# Patient Record
Sex: Female | Born: 1935
Health system: Southern US, Community
[De-identification: ages and names within clinical notes are randomized; demographics above are authoritative.]

## PROBLEM LIST (undated history)

## (undated) DIAGNOSIS — I10 Essential (primary) hypertension: Secondary | ICD-10-CM

## (undated) DIAGNOSIS — E119 Type 2 diabetes mellitus without complications: Secondary | ICD-10-CM

## (undated) DIAGNOSIS — G4730 Sleep apnea, unspecified: Secondary | ICD-10-CM

## (undated) DIAGNOSIS — Q453 Other congenital malformations of pancreas and pancreatic duct: Secondary | ICD-10-CM

## (undated) DIAGNOSIS — I447 Left bundle-branch block, unspecified: Secondary | ICD-10-CM

## (undated) DIAGNOSIS — I5042 Chronic combined systolic (congestive) and diastolic (congestive) heart failure: Secondary | ICD-10-CM

## (undated) DIAGNOSIS — D649 Anemia, unspecified: Secondary | ICD-10-CM

## (undated) DIAGNOSIS — I4891 Unspecified atrial fibrillation: Secondary | ICD-10-CM

## (undated) DIAGNOSIS — J449 Chronic obstructive pulmonary disease, unspecified: Secondary | ICD-10-CM

## (undated) DIAGNOSIS — E785 Hyperlipidemia, unspecified: Secondary | ICD-10-CM

## (undated) DIAGNOSIS — R945 Abnormal results of liver function studies: Secondary | ICD-10-CM

## (undated) DIAGNOSIS — K635 Polyp of colon: Secondary | ICD-10-CM

## (undated) DIAGNOSIS — R7989 Other specified abnormal findings of blood chemistry: Secondary | ICD-10-CM

## (undated) DIAGNOSIS — K648 Other hemorrhoids: Secondary | ICD-10-CM

## (undated) DIAGNOSIS — Z720 Tobacco use: Secondary | ICD-10-CM

## (undated) DIAGNOSIS — I700 Atherosclerosis of aorta: Secondary | ICD-10-CM

## (undated) DIAGNOSIS — I429 Cardiomyopathy, unspecified: Secondary | ICD-10-CM

## (undated) HISTORY — DX: Other congenital malformations of pancreas and pancreatic duct: Q45.3

## (undated) HISTORY — DX: Type 2 diabetes mellitus without complications: E11.9

## (undated) HISTORY — DX: Cardiomyopathy, unspecified: I42.9

## (undated) HISTORY — DX: Abnormal results of liver function studies: R94.5

## (undated) HISTORY — DX: Polyp of colon: K63.5

## (undated) HISTORY — DX: Tobacco use: Z72.0

## (undated) HISTORY — DX: Sleep apnea, unspecified: G47.30

## (undated) HISTORY — PX: BREAST LUMPECTOMY: SHX2

## (undated) HISTORY — DX: Hyperlipidemia, unspecified: E78.5

## (undated) HISTORY — DX: Atherosclerosis of aorta: I70.0

## (undated) HISTORY — DX: Essential (primary) hypertension: I10

## (undated) HISTORY — DX: Chronic combined systolic (congestive) and diastolic (congestive) heart failure: I50.42

## (undated) HISTORY — DX: Other hemorrhoids: K64.8

## (undated) HISTORY — DX: Chronic obstructive pulmonary disease, unspecified: J44.9

## (undated) HISTORY — DX: Other specified abnormal findings of blood chemistry: R79.89

---
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
## 1963-05-05 HISTORY — PX: ABDOMINAL HYSTERECTOMY: SHX81

## 1997-12-19 LAB — HM MAMMOGRAPHY: HM Mammogram: NORMAL

## 2009-03-19 LAB — HM COLONOSCOPY

## 2009-12-27 ENCOUNTER — Ambulatory Visit: Payer: Self-pay | Admitting: Internal Medicine

## 2009-12-27 DIAGNOSIS — R490 Dysphonia: Secondary | ICD-10-CM

## 2009-12-27 DIAGNOSIS — R0989 Other specified symptoms and signs involving the circulatory and respiratory systems: Secondary | ICD-10-CM

## 2009-12-27 DIAGNOSIS — M8580 Other specified disorders of bone density and structure, unspecified site: Secondary | ICD-10-CM

## 2009-12-27 DIAGNOSIS — I10 Essential (primary) hypertension: Secondary | ICD-10-CM | POA: Insufficient documentation

## 2009-12-27 LAB — CONVERTED CEMR LAB

## 2010-01-03 ENCOUNTER — Encounter: Payer: Self-pay | Admitting: Internal Medicine

## 2010-01-03 ENCOUNTER — Ambulatory Visit (HOSPITAL_BASED_OUTPATIENT_CLINIC_OR_DEPARTMENT_OTHER): Admission: RE | Admit: 2010-01-03 | Discharge: 2010-01-03 | Payer: Self-pay | Admitting: Internal Medicine

## 2010-01-03 ENCOUNTER — Telehealth: Payer: Self-pay | Admitting: Internal Medicine

## 2010-01-03 ENCOUNTER — Ambulatory Visit: Payer: Self-pay | Admitting: Diagnostic Radiology

## 2010-01-03 LAB — CONVERTED CEMR LAB
AST: 15 units/L (ref 0–37)
Albumin: 4.2 g/dL (ref 3.5–5.2)
Alkaline Phosphatase: 55 units/L (ref 39–117)
BUN: 17 mg/dL (ref 6–23)
Calcium: 9.5 mg/dL (ref 8.4–10.5)
Chloride: 100 meq/L (ref 96–112)
Creatinine, Ser: 0.85 mg/dL (ref 0.40–1.20)
HDL: 60 mg/dL (ref >39)
Indirect Bilirubin: 0.3 mg/dL (ref 0.0–0.9)
LDL Cholesterol: 113 mg/dL — ABNORMAL HIGH (ref 0–99)
Total Bilirubin: 0.4 mg/dL (ref 0.3–1.2)
Total Protein: 6.6 g/dL (ref 6.0–8.3)
Triglycerides: 81 mg/dL (ref <150)

## 2010-01-07 ENCOUNTER — Encounter: Payer: Self-pay | Admitting: Internal Medicine

## 2010-01-09 ENCOUNTER — Encounter: Payer: Self-pay | Admitting: Internal Medicine

## 2010-01-10 ENCOUNTER — Ambulatory Visit: Payer: Self-pay | Admitting: Internal Medicine

## 2010-01-10 ENCOUNTER — Encounter: Payer: Self-pay | Admitting: Internal Medicine

## 2010-02-14 ENCOUNTER — Ambulatory Visit: Payer: Self-pay | Admitting: Internal Medicine

## 2010-06-03 NOTE — Consult Note (Signed)
Summary: [facility]  [facility]   Imported By: Lanelle Bal 01/28/2010 10:11:46  _____________________________________________________________________  External Attachment:    Type:   Image     Comment:   External Document

## 2010-06-03 NOTE — Miscellaneous (Signed)
Summary: BONE DENSITY  Clinical Lists Changes  Orders: Added new Test order of T-Bone Densitometry (77080) - Signed Added new Test order of T-Lumbar Vertebral Assessment (77082) - Signed 

## 2010-06-03 NOTE — Assessment & Plan Note (Signed)
Summary: NEW PT EST CARE/DT   Vital Signs:  Patient profile:   77 year old female Height:      62.5 inches Weight:      123 pounds BMI:     22.22 O2 Sat:      98 % on Room air Temp:     98.6 degrees F oral Pulse rate:   92 / minute Pulse rhythm:   irregular Resp:     24 per minute BP sitting:   128 / 60  (right arm) Cuff size:   regular  Vitals Entered By: Glendell Docker CMA (December 27, 2009 1:37 PM)  O2 Flow:  Room air CC: New Patient Is Patient Diabetic? No Pain Assessment Patient in pain? no       Does patient need assistance? Functional Status Self care Ambulation Impaired:Risk for fall     Last PAP Result Hysterectomy   Primary Care Provider:  Dondra Spry DO  CC:  New Patient.  History of Present Illness: 77 y/o white female to establish 47 -  dx ' ed with Htn  chronic cough.  not worse with ACE  simvastatin -   yearly blood work  smoker since 1974 - close to 2 ppd  years ago - tried to quit but got sore throat  full aspirin causes GI upset no chronic heart burn    chronic hoarseness - never seen by ENT    Preventive Screening-Counseling & Management  Alcohol-Tobacco     Alcohol drinks/day: 0     Smoking Status: current     Packs/Day: 2.0     Year Started: 1974  Caffeine-Diet-Exercise     Caffeine use/day: 2 cups coffee daily     Does Patient Exercise: no  Allergies (verified): No Known Drug Allergies  Past History:  Past Medical History: Hypertension Hyperthyroidism  Past Surgical History: Hemorrhoidectomy 1957 Hysterectomy 1965 Lumpectomy  1966, 1971 Colonoscopy 2009 (small benign polyp)  Family History: Family History Diabetes 1st degree relative  Social History: Retired Divorced  1 daughter 51 1 son 40  ([place]) Moved from [place] 2009 - [place], [place] Smoking Status:  current Packs/Day:  2.0 Caffeine use/day:  2 cups coffee daily Does Patient Exercise:  no  Review of Systems  The patient denies anorexia,  weight loss, weight gain, chest pain, abdominal pain, melena, hematochezia, severe indigestion/heartburn, and depression.         chronic hoarseness,  denies GERD symptoms  Physical Exam  General:  alert and underweight appearing.  raspy , hoarse voice Head:  normocephalic and atraumatic.   Eyes:  pupils equal, pupils round, and pupils reactive to light.   Ears:  R ear normal, L ear normal, and no external deformities.   Mouth:  no gingival abnormalities, no dental plaque, and pharynx pink and moist.   Abdomen:  abd bruit, soft, non-tender, no hepatomegaly, and no splenomegaly.   Extremities:  No lower extremity edema  Neurologic:  cranial nerves II-XII intact and gait normal.   Psych:  normally interactive, good eye contact, not anxious appearing, and not depressed appearing.     Impression & Recommendations:  Problem # 1:  HOARSENESS, CHRONIC (ZOX-096.04) 77 y/o female smoker w chronic hoarseness.  refer to ENT - rule out throat cancer Orders: ENT Referral (ENT)  Problem # 2:  HYPERTENSION (ICD-401.9) stable. monitor labs.  Maintain current medication regimen.  Her updated medication list for this problem includes:    Lisinopril-hydrochlorothiazide 20-12.5 Mg Tabs (Lisinopril-hydrochlorothiazide) .Marland Kitchen... Take 1 tablet by  mouth every morning  Orders: T-Basic Metabolic Panel [phone])  BP today: 128/60  Problem # 3:  ABDOMINAL BRUIT (ICD-785.9)  Orders: Ultrasound (Ultrasound)  Problem # 4:  HYPERLIPIDEMIA (ICD-272.4)  Her updated medication list for this problem includes:    Simvastatin 40 Mg Tabs (Simvastatin) .Marland Kitchen... Take 1 tab by mouth at bedtime  Orders: T-Hepatic Function [phone]) T-Lipid Profile [phone]) T-TSH [phone])  Complete Medication List: 1)  Lisinopril-hydrochlorothiazide 20-12.5 Mg Tabs (Lisinopril-hydrochlorothiazide) .... Take 1 tablet by mouth every morning 2)  Simvastatin 40 Mg Tabs (Simvastatin) .... Take 1 tab by mouth at  bedtime 3)  Aspirin Low Dose 81 Mg Tabs (Aspirin) .... Take 1 tablet by mouth once a day  Other Orders: Radiology Referral (Radiology) T- * Misc. Laboratory test [phone]) Pneumococcal Vaccine (38756) Influenza Vaccine MCR [id number]) Admin 1st Vaccine (51884)  Patient Instructions: 1)  Please schedule a follow-up appointment in 2 months. Prescriptions: SIMVASTATIN 40 MG TABS (SIMVASTATIN) Take 1 tab by mouth at bedtime  #90 x 1   Entered and Authorized by:   D. Thomos Lemons DO   Signed by:   D. Thomos Lemons DO on 12/27/2009   Method used:   Electronically to        [facility] # [id number]* (retail)       [address] [place], [place]  [address]       Ph: [phone]       Fax: [phone]   RxID:   [id number] LISINOPRIL-HYDROCHLOROTHIAZIDE 20-12.5 MG TABS (LISINOPRIL-HYDROCHLOROTHIAZIDE) Take 1 tablet by mouth every morning  #90 x 1   Entered and Authorized by:   D. Thomos Lemons DO   Signed by:   D. Thomos Lemons DO on 12/27/2009   Method used:   Electronically to        [facility] # [id number]* (retail)       [address] [place], [place]  [address]       Ph: [phone]       Fax: [phone]   RxID:   [id number]   Current Allergies (reviewed today): No known allergies    Preventive Care Screening  Pap Smear:    Date:  12/27/2009    Results:  Hysterectomy  Mammogram:    Date:  12/19/1997    Results:  normal     Immunizations Administered:  Pneumonia Vaccine:    Vaccine Type: Pneumovax (Medicare)    Site: right deltoid    Mfr: Merck    Dose: 0.5 ml    Route: IM    Given by: Glendell Docker CMA    Exp. Date: 12/16/2010    Lot #: [id number]    VIS given: 02/07/2008  Influenza Vaccine # 1:    Vaccine Type: Fluvax MCR    Site: left deltoid    Mfr: GlaxoSmithKline    Dose: 0.5 ml    Route: IM    Given by: Glendell Docker CMA    Exp. Date: 11/01/2010    Lot #: [id number]    VIS given:  11/26/2009  Flu Vaccine Consent Questions:    Do you have a history of severe allergic reactions to this vaccine? no    Any prior history of allergic reactions to egg and/or gelatin? no    Do you have a sensitivity to the preservative Thimersol? no  Do you have a past history of Guillan-Barre Syndrome? no    Do you currently have an acute febrile illness? no    Have you ever had a severe reaction to latex? no    Vaccine information given and explained to patient? yes    Are you currently pregnant? no

## 2010-06-03 NOTE — Letter (Signed)
   [facility] [address] [place], [place]  [address]  [place] Phone: [phone]      January 07, 2010   Caitlain Ingle [address] [facility] [address] [place], [place] [address]  RE:  LAB RESULTS  Dear  Ms. NEIDHARDT,  The following is an interpretation of your most recent lab tests.  Please take note of any instructions provided or changes to medications that have resulted from your lab work.  ELECTROLYTES:  Good - no changes needed  KIDNEY FUNCTION TESTS:  Good - no changes needed  LIVER FUNCTION TESTS:  Good - no changes needed  LIPID PANEL:  Fair - review at your next visit Triglyceride: 81   Cholesterol: 189   LDL: 113   HDL: 60   Chol/HDL%:  3.2 Ratio  THYROID STUDIES:  Thyroid studies normal TSH: 0.832     Vitamin D level - low       Sincerely Yours,    Dr. Thomos Lemons  Appended Document:  mailed

## 2010-06-03 NOTE — Letter (Signed)
Summary: [facility]ensit[facility] / [facility] / [place]   Imported By: Lennie Odor 01/16/2010 12:03:14  _____________________________________________________________________  External Attachment:    Type:   Image     Comment:   External Document

## 2010-06-03 NOTE — Progress Notes (Signed)
Summary: Ultrasound Results  Phone Note Outgoing Call   Summary of Call: call pt - no abdominal aortic aneurysm.  I will further discuss details of abd u/s at next OV in 2 months. (plz make sure pt have f/u appt within 2 months) Initial call taken by: D. Thomos Lemons DO,  January 03, 2010 6:06 PM  Follow-up for Phone Call        call placed to patient at [phone], no answer. A voice message was left for patient to return call Follow-up by: Glendell Docker CMA,  January 07, 2010 11:22 AM  Additional Follow-up for Phone Call Additional follow up Details #1::        call pt - vitamin D level is low.  I suggest pt take vit D3 otc 2000 units once daily I will send letter re:  other lab results    Additional Follow-up for Phone Call Additional follow up Details #2::    Pt notified of lab results. She states that her daughter or son-in-law will have to call us to schedule appt. as she relies on them for transportation. Nicki Guadalajara Fergerson CMA Duncan Dull)  January 09, 2010 8:41 AM

## 2010-06-03 NOTE — Assessment & Plan Note (Signed)
Summary: 2 mon  f/u/hea   Vital Signs:  Patient profile:   77 year old female Height:      62.5 inches Weight:      124.50 pounds BMI:     22.49 O2 Sat:      97 % on Room air Temp:     98.4 degrees F oral Pulse rate:   93 / minute Pulse rhythm:   regular Resp:     18 per minute BP sitting:   110 / 70  (left arm) Cuff size:   regular  Vitals Entered By: Glendell Docker CMA (February 14, 2010 2:23 PM)  O2 Flow:  Room air CC: 2 Month Follow up  Is Patient Diabetic? No Pain Assessment Patient in pain? no        Primary Care Provider:  Dondra Spry DO  CC:  2 Month Follow up .  History of Present Illness: 77 y/o white female for f/u evaluated by ENT for chronic hoarseness pt noted to have vocal cord edema but no evidence of malignancy  reviewed bone density scan she has osteopenia  Preventive Screening-Counseling & Management  Alcohol-Tobacco     Smoking Status: current     Smoking Cessation Counseling: yes  Allergies (verified): No Known Drug Allergies  Past History:  Past Medical History: Hypertension Hyperthyroidism    Past Surgical History: Hemorrhoidectomy 1957 Hysterectomy 1965 Lumpectomy  1966, 1971  Colonoscopy 2009 (small benign polyp)  Family History: Family History Diabetes 1st degree relative    Social History: Retired Divorced  1 daughter 57  1 son 40  ([place]) Moved from [place] 2009 - [place], [place]  Physical Exam  General:  alert, well-developed, and well-nourished.   Lungs:  normal respiratory effort.  prolonged expiration Heart:  normal rate, regular rhythm, and no gallop.     Impression & Recommendations:  Problem # 1:  HOARSENESS, CHRONIC (ICD-784.42) Assessment Unchanged evaluated by ENT vocal cord edema no evidence of mass  Problem # 2:  OSTEOPENIA (ICD-733.90) T score of hips -1.9 continue calcium via diet and vit d supplement.   repeat dexa in 2 yrs  Problem # 3:  HYPERTENSION (ICD-401.9) Assessment:  Unchanged  Her updated medication list for this problem includes:    Lisinopril-hydrochlorothiazide 20-12.5 Mg Tabs (Lisinopril-hydrochlorothiazide) .Marland Kitchen... Take 1 tablet by mouth every morning  BP today: 110/70 Prior BP: 128/60 (12/27/2009)  Labs Reviewed: K+: 4.2 (01/03/2010) Creat: : 0.85 (01/03/2010)   Chol: 189 (01/03/2010)   HDL: 60 (01/03/2010)   LDL: 113 (01/03/2010)   TG: 81 (01/03/2010)  Problem # 4:  ABDOMINAL BRUIT (ICD-785.9) IMPRESSION:   1.  Extensive mural atheromatous change with calcific plaque with normal caliber abdominal aorta and bilateral common iliac arteries. 2.  Otherwise, negative.    Problem # 5:  HYPERLIPIDEMIA (ICD-272.4)  Her updated medication list for this problem includes:    Simvastatin 40 Mg Tabs (Simvastatin) .Marland Kitchen... Take 1 tab by mouth at bedtime  Labs Reviewed: SGOT: 15 (01/03/2010)   SGPT: 11 (01/03/2010)   HDL:60 (01/03/2010)  LDL:113 (01/03/2010)  Chol:189 (01/03/2010)  Trig:81 (01/03/2010)  Complete Medication List: 1)  Lisinopril-hydrochlorothiazide 20-12.5 Mg Tabs (Lisinopril-hydrochlorothiazide) .... Take 1 tablet by mouth every morning 2)  Simvastatin 40 Mg Tabs (Simvastatin) .... Take 1 tab by mouth at bedtime 3)  Aspirin Low Dose 81 Mg Tabs (Aspirin) .... Take 1 tablet by mouth once a day 4)  Vitamin D 2000 Unit Tabs (Cholecalciferol) .... Take 1 tablet by mouth once a day  Patient  Instructions: 1)  Please schedule a follow-up appointment in 6 months. 2)  BMP prior to visit, ICD-9:  401.9 3)  vitamin d level: 733.90 4)  Please return for lab work one (1) week before your next appointment.   Current Allergies (reviewed today): No known allergies

## 2010-06-05 ENCOUNTER — Encounter: Payer: Self-pay | Admitting: Internal Medicine

## 2010-08-15 ENCOUNTER — Ambulatory Visit: Payer: Self-pay | Admitting: Internal Medicine

## 2010-08-22 ENCOUNTER — Ambulatory Visit (INDEPENDENT_AMBULATORY_CARE_PROVIDER_SITE_OTHER): Payer: Medicare Other | Admitting: Internal Medicine

## 2010-08-22 ENCOUNTER — Encounter: Payer: Self-pay | Admitting: Internal Medicine

## 2010-08-22 DIAGNOSIS — Z720 Tobacco use: Secondary | ICD-10-CM

## 2010-08-22 DIAGNOSIS — E785 Hyperlipidemia, unspecified: Secondary | ICD-10-CM

## 2010-08-22 DIAGNOSIS — I10 Essential (primary) hypertension: Secondary | ICD-10-CM

## 2010-08-22 DIAGNOSIS — M899 Disorder of bone, unspecified: Secondary | ICD-10-CM

## 2010-08-22 DIAGNOSIS — F17200 Nicotine dependence, unspecified, uncomplicated: Secondary | ICD-10-CM

## 2010-08-22 DIAGNOSIS — M949 Disorder of cartilage, unspecified: Secondary | ICD-10-CM

## 2010-08-22 LAB — HM PAP SMEAR

## 2010-08-22 MED ORDER — LISINOPRIL-HYDROCHLOROTHIAZIDE 20-12.5 MG PO TABS: 1.0000 | ORAL_TABLET | ORAL | Status: DC

## 2010-08-22 MED ORDER — SIMVASTATIN 40 MG PO TABS: 20.0000 mg | ORAL_TABLET | Freq: Every day | ORAL | Status: DC

## 2010-08-22 NOTE — Assessment & Plan Note (Signed)
Reduce dose to 20 mg LFTs and FLP before next OV

## 2010-08-22 NOTE — Assessment & Plan Note (Signed)
Pt has been taking otc vit D 2000 units once daily Repeat vit D level today

## 2010-08-22 NOTE — Progress Notes (Signed)
  Subjective:    Patient ID: Sarah [name], female    DOB: February 20, 1932, 77 y.o.   MRN: [id number]  Hypertension This is a chronic problem. The current episode started more than 1 year ago. The problem is unchanged. The problem is controlled. Risk factors for coronary artery disease include smoking/tobacco exposure and sedentary lifestyle. Past treatments include ACE inhibitors and diuretics. There are no compliance problems.  There is no history of kidney disease or heart failure.   She has AM cough but continues to smoke   Review of Systems    Negative for chest pain  Past Medical History  Diagnosis Date  . Hypertension   . Thyroid disease     hyperthyroidism    History   Social History  . Marital Status: Divorced    Spouse Name: N/A    Number of Children: 2  . Years of Education: N/A   Occupational History  . retired    Social History Main Topics  . Smoking status: Current Everyday Smoker  . Smokeless tobacco: Not on file  . Alcohol Use: No  . Drug Use: Not on file  . Sexually Active: Not on file   Other Topics Concern  . Not on file   Social History Narrative   Moved from [place], [place] 2009    Past Surgical History  Procedure Date  . Abdominal hysterectomy 1965  . Hemorroidectomy 1957  . Breast lumpectomy 1966, 1971    Family History  Problem Relation Age of Onset  . Diabetes      No Known Allergies  Current Outpatient Prescriptions on File Prior to Visit  Medication Sig Dispense Refill  . aspirin 81 MG tablet Take 81 mg by mouth daily.        . Cholecalciferol (VITAMIN D3) 2000 UNITS TABS Take 1 tablet by mouth daily.          BP 100/60  Pulse 73  Temp(Src) 98.6 F (37 C) (Oral)  Resp 20  Ht 5' 2.6" (1.59 m)  Wt 122 lb (55.339 kg)  BMI 21.89 kg/m2  SpO2 97%    Objective:   Physical Exam  Constitutional: She appears well-developed and well-nourished.  Cardiovascular: Normal rate, regular rhythm and normal heart sounds.     Pulmonary/Chest: Effort normal. She has no wheezes. She has no rales.       Prolonged expiration  Skin: Skin is warm and dry.  Psychiatric: Her behavior is normal.          Assessment & Plan:

## 2010-08-22 NOTE — Assessment & Plan Note (Signed)
BP is well controlled.  She denies dizziness. Monitor electrolytes   Chemistry      Component Value Date/Time   NA 136 01/03/2010 1832   K 4.2 01/03/2010 1832   CL 100 01/03/2010 1832   CO2 25 01/03/2010 1832   BUN 17 01/03/2010 1832   CREATININE 0.85 01/03/2010 1832      Component Value Date/Time   CALCIUM 9.5 01/03/2010 1832   ALKPHOS 55 01/03/2010 1832   AST 15 01/03/2010 1832   ALT 11 01/03/2010 1832   BILITOT 0.4 01/03/2010 1832

## 2010-08-22 NOTE — Patient Instructions (Signed)
Please complete the following lab tests before your next follow up appointment: BMET - 401.9 FLP, LFTs - 272.4 

## 2010-08-23 LAB — BASIC METABOLIC PANEL WITH GFR
CO2: 22 mEq/L (ref 19–32)
Chloride: 102 mEq/L (ref 96–112)
Glucose, Bld: 100 mg/dL — ABNORMAL HIGH (ref 70–99)
Potassium: 4.6 mEq/L (ref 3.5–5.3)
Sodium: 138 mEq/L (ref 135–145)

## 2010-08-25 ENCOUNTER — Encounter: Payer: Self-pay | Admitting: Internal Medicine

## 2011-01-16 ENCOUNTER — Encounter: Payer: Self-pay | Admitting: Internal Medicine

## 2011-01-16 ENCOUNTER — Ambulatory Visit (INDEPENDENT_AMBULATORY_CARE_PROVIDER_SITE_OTHER): Payer: Medicare Other | Admitting: Internal Medicine

## 2011-01-16 VITALS — BP 124/60 | HR 93 | Temp 98.6°F | Resp 18 | Ht 62.0 in | Wt 116.0 lb

## 2011-01-16 DIAGNOSIS — R3129 Other microscopic hematuria: Secondary | ICD-10-CM

## 2011-01-16 DIAGNOSIS — Z23 Encounter for immunization: Secondary | ICD-10-CM

## 2011-01-16 DIAGNOSIS — N951 Menopausal and female climacteric states: Secondary | ICD-10-CM

## 2011-01-16 DIAGNOSIS — R232 Flushing: Secondary | ICD-10-CM

## 2011-01-16 DIAGNOSIS — M549 Dorsalgia, unspecified: Secondary | ICD-10-CM

## 2011-01-16 LAB — POCT URINALYSIS DIPSTICK
Bilirubin, UA: NEGATIVE
Glucose, UA: NEGATIVE
Ketones, UA: NEGATIVE
Spec Grav, UA: 1.015

## 2011-01-16 NOTE — Assessment & Plan Note (Signed)
Hx suggestive of msk etiology. ua shows microscopic hematuria. Discussed plain radiograph and medication both of which she defers. Pt wishes for period of further observation. Instructed to followup closely if no improvement or worsening.

## 2011-01-16 NOTE — Assessment & Plan Note (Signed)
Asx. Presentation not suggestive of kidney stones or uti. Obtain ua with micro.

## 2011-01-16 NOTE — Progress Notes (Signed)
  Subjective:    Patient ID: Sarah [name], female    DOB: 14-Feb-1932, 77 y.o.   MRN: [id number]  HPI Pt presents to clinic for evaluation of back pain. Notes several week h/o right low back pain without injury/trauma/fall. Pain does not radiate and localizes generally to SI area. States began after bed mattress was flipped over. Just one week ago flipped mattress back. Pain is positional and intermittent. Tylenol was constipating. Attempted motrin 400mg  tid x 1wk without abdominal pain or burning. Denies fever, chills, urinary frequency/urgency, dysuria or hematuria. Also notes few wk h/o intermittent sweats and hot flashes. Denies fevers. No other constitutional sx's. No exacerbating or alleviating factors. No other complaints.  Past Medical History  Diagnosis Date  . Hypertension   . Thyroid disease     hyperthyroidism   Past Surgical History  Procedure Date  . Abdominal hysterectomy 1965  . Hemorroidectomy 1957  . Breast lumpectomy 1966, 1971    reports that she has been smoking.  She has never used smokeless tobacco. She reports that she does not drink alcohol. Her drug history not on file. family history includes Diabetes in an unspecified family member. No Known Allergies     Review of Systems see hpi     Objective:   Physical Exam  Nursing note and vitals reviewed. Constitutional: She appears well-developed and well-nourished. No distress.  HENT:  Head: Normocephalic and atraumatic.  Eyes: Conjunctivae are normal. No scleral icterus.  Musculoskeletal:       No midline ls tenderness. Gait nl. Area of pain localized to right SI area vs lower paraspinal muscle tendon insertion.   Neurological: She is alert.  Skin: Skin is warm. She is not diaphoretic.  Psychiatric: She has a normal mood and affect.          Assessment & Plan:

## 2011-01-16 NOTE — Assessment & Plan Note (Signed)
Discussed potential etiologies. Pt does not feel represents fever. Declines evaluation. Agrees to close followup if sx's persist or worsen.

## 2011-01-17 LAB — URINALYSIS, ROUTINE W REFLEX MICROSCOPIC
Glucose, UA: NEGATIVE mg/dL
Leukocytes, UA: NEGATIVE
Nitrite: NEGATIVE
pH: 6 (ref 5.0–8.0)

## 2011-01-17 LAB — URINALYSIS, MICROSCOPIC ONLY
Casts: NONE SEEN
Squamous Epithelial / LPF: NONE SEEN

## 2011-04-01 ENCOUNTER — Telehealth: Payer: Self-pay | Admitting: Internal Medicine

## 2011-04-01 NOTE — Telephone Encounter (Signed)
[facility] n [address] [place] refill lisinopril and simvastatin

## 2011-04-02 MED ORDER — LISINOPRIL-HYDROCHLOROTHIAZIDE 20-12.5 MG PO TABS: 1.0000 | ORAL_TABLET | ORAL | Status: DC

## 2011-04-02 MED ORDER — SIMVASTATIN 40 MG PO TABS: 20.0000 mg | ORAL_TABLET | Freq: Every day | ORAL | Status: DC

## 2011-04-02 NOTE — Telephone Encounter (Signed)
Rx refill sent to pharmacy. 

## 2011-04-17 ENCOUNTER — Ambulatory Visit (HOSPITAL_BASED_OUTPATIENT_CLINIC_OR_DEPARTMENT_OTHER)
Admission: RE | Admit: 2011-04-17 | Discharge: 2011-04-17 | Disposition: A | Discharge status: Home / Self Care | Payer: Medicare Other | Source: Ambulatory Visit | Attending: Internal Medicine | Admitting: Internal Medicine

## 2011-04-17 ENCOUNTER — Ambulatory Visit (INDEPENDENT_AMBULATORY_CARE_PROVIDER_SITE_OTHER): Payer: Medicare Other | Admitting: Internal Medicine

## 2011-04-17 ENCOUNTER — Encounter: Payer: Self-pay | Admitting: Internal Medicine

## 2011-04-17 DIAGNOSIS — M47817 Spondylosis without myelopathy or radiculopathy, lumbosacral region: Secondary | ICD-10-CM

## 2011-04-17 DIAGNOSIS — I10 Essential (primary) hypertension: Secondary | ICD-10-CM

## 2011-04-17 DIAGNOSIS — M4120 Other idiopathic scoliosis, site unspecified: Secondary | ICD-10-CM

## 2011-04-17 DIAGNOSIS — M549 Dorsalgia, unspecified: Secondary | ICD-10-CM | POA: Insufficient documentation

## 2011-04-17 DIAGNOSIS — E785 Hyperlipidemia, unspecified: Secondary | ICD-10-CM

## 2011-04-17 LAB — HEPATIC FUNCTION PANEL
AST: 17 U/L (ref 0–37)
Albumin: 4.2 g/dL (ref 3.5–5.2)
Alkaline Phosphatase: 43 U/L (ref 39–117)
Indirect Bilirubin: 0.3 mg/dL (ref 0.0–0.9)
Total Bilirubin: 0.4 mg/dL (ref 0.3–1.2)

## 2011-04-17 LAB — LIPID PANEL
HDL: 61 mg/dL (ref >39)
LDL Cholesterol: 110 mg/dL — ABNORMAL HIGH (ref 0–99)

## 2011-04-17 LAB — CBC
HCT: 40.1 % (ref 36.0–46.0)
MCH: 33.9 pg (ref 26.0–34.0)
MCV: 99.3 fL (ref 78.0–100.0)
Platelets: 354 10*3/uL (ref 150–400)
RBC: 4.04 MIL/uL (ref 3.87–5.11)
RDW: 13.3 % (ref 11.5–15.5)

## 2011-04-17 LAB — BASIC METABOLIC PANEL
BUN: 17 mg/dL (ref 6–23)
CO2: 26 mEq/L (ref 19–32)
Calcium: 9.9 mg/dL (ref 8.4–10.5)
Creat: 0.83 mg/dL (ref 0.50–1.10)

## 2011-04-17 NOTE — Progress Notes (Signed)
  Subjective:    Patient ID: Sarah [name], female    DOB: 1931/05/26, 77 y.o.   MRN: [id number]  HPI Pt presents to clinic for followup of multiple medical problems. Notes chronic intermittent low back pain since 9/12. No fall or trauma. Pain does not radiate down legs. Takes ibuprofen daily without gi adverse effect. Tolerates statin tx without myalgias or abn lft. BP reviewed normotensive. No other complaints.  Past Medical History  Diagnosis Date  . Hypertension   . Thyroid disease     hyperthyroidism   Past Surgical History  Procedure Date  . Abdominal hysterectomy 1965  . Hemorroidectomy 1957  . Breast lumpectomy 1966, 1971    reports that she has been smoking.  She has never used smokeless tobacco. She reports that she does not drink alcohol. Her drug history not on file. family history includes Diabetes in an unspecified family member. No Known Allergies   Review of Systems see hpi     Objective:   Physical Exam  Nursing note and vitals reviewed. Constitutional: She appears well-developed and well-nourished. No distress.  HENT:  Head: Normocephalic and atraumatic.  Eyes: Conjunctivae are normal. No scleral icterus.  Musculoskeletal:       NT ls midline spine without bony abn. No overt muscle spasm. Nl gait  Skin: She is not diaphoretic.          Assessment & Plan:

## 2011-04-18 NOTE — Assessment & Plan Note (Signed)
Obtain lipid/lft. 

## 2011-04-18 NOTE — Assessment & Plan Note (Signed)
Obtain ls plain xray. Samples of lidoderm patches. Limit ibuprofen

## 2011-04-18 NOTE — Assessment & Plan Note (Signed)
Normotensive and stable. Continue current regimen. Monitor bp as outpt and followup in clinic as scheduled. Obtain cbc, chem7 

## 2011-04-22 ENCOUNTER — Telehealth: Payer: Self-pay | Admitting: *Deleted

## 2011-04-22 NOTE — Telephone Encounter (Signed)
Call returned to [place] at [phone], no answer; no voice message left.

## 2011-04-22 NOTE — Telephone Encounter (Signed)
Patients daughter Darl Pikes returned phone call, and stated the message was received regarding the degenerative changes. She would like to know what patient should do should any problems arise.

## 2011-04-22 NOTE — Telephone Encounter (Signed)
Call placed to patients daughter at [phone],, she was informed per Dr Rodena Medin instructions. She stated patient has tried the heating pad, and did not get any relief. Daughter stated she will see how patient does and if there is no improvement she will call back on Wednesday of next week.

## 2011-04-22 NOTE — Telephone Encounter (Signed)
Having the degenerative changes means she is more likely to have flares of back pain in the future

## 2011-04-23 ENCOUNTER — Telehealth: Payer: Self-pay | Admitting: *Deleted

## 2011-04-23 MED ORDER — TRAMADOL HCL 50 MG PO TABS: ORAL_TABLET | ORAL | Status: DC

## 2011-04-23 NOTE — Telephone Encounter (Signed)
Ultram 50mg  1/2 to 1 po tid prn pain #30

## 2011-04-23 NOTE — Telephone Encounter (Signed)
Patients daughter called and left voice message stating she has spoke with patient and she is having a significant amount of back pain. She would like to know if Dr Rodena Medin would send a Rx for pain into the [facility] pharmacy.

## 2011-04-23 NOTE — Telephone Encounter (Signed)
Call placed to patients daughter Darl Pikes. She was informed of Rx to pharmacy.

## 2011-05-26 ENCOUNTER — Other Ambulatory Visit: Payer: Self-pay | Admitting: Internal Medicine

## 2011-05-26 NOTE — Telephone Encounter (Signed)
Ok #30 rf 1 

## 2011-05-26 NOTE — Telephone Encounter (Signed)
Rx refill sent to pharmacy. 

## 2011-06-09 ENCOUNTER — Telehealth: Payer: Self-pay | Admitting: Internal Medicine

## 2011-06-09 MED ORDER — SIMVASTATIN 40 MG PO TABS: 20.0000 mg | ORAL_TABLET | ORAL | Status: DC

## 2011-06-09 NOTE — Telephone Encounter (Signed)
Refill sent to pharmacy.   

## 2011-07-17 ENCOUNTER — Ambulatory Visit: Payer: Medicare Other | Admitting: Internal Medicine

## 2011-07-22 ENCOUNTER — Telehealth: Payer: Self-pay | Admitting: Internal Medicine

## 2011-07-22 MED ORDER — LISINOPRIL-HYDROCHLOROTHIAZIDE 20-12.5 MG PO TABS: 1.0000 | ORAL_TABLET | ORAL | Status: DC

## 2011-07-22 NOTE — Telephone Encounter (Signed)
Refill sent to pharmacy #90 x no refills. 

## 2011-07-22 NOTE — Telephone Encounter (Signed)
Refill- lisinopril-hctz 20/12.5mg . PO QAM #90.

## 2011-07-24 ENCOUNTER — Ambulatory Visit: Payer: Medicare Other | Admitting: Internal Medicine

## 2011-08-14 ENCOUNTER — Ambulatory Visit (INDEPENDENT_AMBULATORY_CARE_PROVIDER_SITE_OTHER): Payer: Medicare Other | Admitting: Internal Medicine

## 2011-08-14 ENCOUNTER — Encounter: Payer: Self-pay | Admitting: Internal Medicine

## 2011-08-14 VITALS — BP 124/80 | HR 103 | Temp 98.8°F | Resp 20 | Wt 114.0 lb

## 2011-08-14 DIAGNOSIS — I10 Essential (primary) hypertension: Secondary | ICD-10-CM

## 2011-08-14 DIAGNOSIS — M549 Dorsalgia, unspecified: Secondary | ICD-10-CM

## 2011-08-14 DIAGNOSIS — E785 Hyperlipidemia, unspecified: Secondary | ICD-10-CM

## 2011-08-14 NOTE — Patient Instructions (Signed)
Please schedule cbc 401.9, chem7 v58.69 and lipid/lft 272.4 prior to next visit 

## 2011-08-14 NOTE — Progress Notes (Signed)
  Subjective:    Patient ID: Sarah [name], female    DOB: 05/17/1931, 78 y.o.   MRN: [id number]  HPI Pt presents to clinic for followup of multiple medical problems. Previous back pain now resolved. Does not take ultram any longer. Has intermittent left shoulder pain she relates to cold weather and recently improved. No injury/trauma or decreased ROM. No other complaints.  Past Medical History  Diagnosis Date  . Hypertension   . Thyroid disease     hyperthyroidism   Past Surgical History  Procedure Date  . Abdominal hysterectomy 1965  . Hemorroidectomy 1957  . Breast lumpectomy 1966, 1971    reports that she has been smoking.  She has never used smokeless tobacco. She reports that she does not drink alcohol. Her drug history not on file. family history includes Diabetes in an unspecified family member. No Known Allergies    Review of Systems see hpi     Objective:   Physical Exam  Physical Exam  Nursing note and vitals reviewed. Constitutional: Appears well-developed and well-nourished. No distress.  HENT:  Head: Normocephalic and atraumatic.  Right Ear: External ear normal.  Left Ear: External ear normal.  Eyes: Conjunctivae are normal. No scleral icterus.  Neck: Neck supple. Carotid bruit is not present.  Cardiovascular: Normal rate, regular rhythm and normal heart sounds.  Exam reveals no gallop and no friction rub.   No murmur heard. Pulmonary/Chest: Effort normal and breath sounds normal. No respiratory distress. He has no wheezes. no rales.  Lymphadenopathy:    He has no cervical adenopathy.  Neurological:Alert.  Skin: Skin is warm and dry. Not diaphoretic.  Psychiatric: Has a normal mood and affect.        Assessment & Plan:

## 2011-08-14 NOTE — Assessment & Plan Note (Signed)
Stable. Obtain lipid/lft prior to next visit. 

## 2011-08-14 NOTE — Assessment & Plan Note (Signed)
Normotensive and stable. Continue current regimen. Monitor bp as outpt and followup in clinic as scheduled.  

## 2011-08-14 NOTE — Assessment & Plan Note (Signed)
resolved 

## 2011-09-21 ENCOUNTER — Other Ambulatory Visit: Payer: Self-pay | Admitting: Internal Medicine

## 2011-09-21 NOTE — Telephone Encounter (Signed)
Rx refill sent to pharmacy. 

## 2011-10-08 ENCOUNTER — Encounter: Payer: Self-pay | Admitting: Internal Medicine

## 2011-11-06 ENCOUNTER — Other Ambulatory Visit: Payer: Self-pay | Admitting: *Deleted

## 2011-11-06 DIAGNOSIS — E785 Hyperlipidemia, unspecified: Secondary | ICD-10-CM

## 2011-11-06 DIAGNOSIS — I10 Essential (primary) hypertension: Secondary | ICD-10-CM

## 2011-11-06 DIAGNOSIS — Z79899 Other long term (current) drug therapy: Secondary | ICD-10-CM

## 2011-11-06 LAB — CBC WITH DIFFERENTIAL/PLATELET
Basophils Relative: 1 % (ref 0–1)
Eosinophils Absolute: 0.2 10*3/uL (ref 0.0–0.7)
Eosinophils Relative: 2 % (ref 0–5)
HCT: 38 % (ref 36.0–46.0)
Hemoglobin: 13.5 g/dL (ref 12.0–15.0)
Lymphs Abs: 2.7 10*3/uL (ref 0.7–4.0)
MCH: 33.9 pg (ref 26.0–34.0)
MCHC: 35.5 g/dL (ref 30.0–36.0)
MCV: 95.5 fL (ref 78.0–100.0)
Monocytes Absolute: 0.9 10*3/uL (ref 0.1–1.0)
Monocytes Relative: 9 % (ref 3–12)
Neutrophils Relative %: 63 % (ref 43–77)
RBC: 3.98 MIL/uL (ref 3.87–5.11)

## 2011-11-06 LAB — LIPID PANEL
Cholesterol: 180 mg/dL (ref 0–200)
Total CHOL/HDL Ratio: 3.1 Ratio

## 2011-11-06 NOTE — Progress Notes (Signed)
Pt presented to the lab, orders printed and given to the lab.

## 2011-11-06 NOTE — Progress Notes (Signed)
Per OV note, 04.12.13/SLS

## 2011-11-07 LAB — HEPATIC FUNCTION PANEL
ALT: 10 U/L (ref 0–35)
AST: 16 U/L (ref 0–37)
Bilirubin, Direct: 0.1 mg/dL (ref 0.0–0.3)
Indirect Bilirubin: 0.3 mg/dL (ref 0.0–0.9)
Total Bilirubin: 0.4 mg/dL (ref 0.3–1.2)

## 2011-11-07 LAB — BASIC METABOLIC PANEL
BUN: 14 mg/dL (ref 6–23)
CO2: 24 mEq/L (ref 19–32)
Calcium: 9.7 mg/dL (ref 8.4–10.5)
Glucose, Bld: 85 mg/dL (ref 70–99)
Sodium: 137 mEq/L (ref 135–145)

## 2011-11-13 ENCOUNTER — Ambulatory Visit (INDEPENDENT_AMBULATORY_CARE_PROVIDER_SITE_OTHER): Payer: Medicare Other | Admitting: Internal Medicine

## 2011-11-13 ENCOUNTER — Encounter: Payer: Self-pay | Admitting: Internal Medicine

## 2011-11-13 VITALS — BP 124/76 | HR 93 | Temp 98.6°F | Resp 16 | Wt 113.0 lb

## 2011-11-13 DIAGNOSIS — I10 Essential (primary) hypertension: Secondary | ICD-10-CM

## 2011-11-13 DIAGNOSIS — R0989 Other specified symptoms and signs involving the circulatory and respiratory systems: Secondary | ICD-10-CM

## 2011-11-13 NOTE — Progress Notes (Signed)
  Subjective:    Patient ID: Sarah [name], female    DOB: 1931-12-12, 78 y.o.   MRN: [id number]  HPI Pt presents to clinic for followup of multiple medical problems. Denies back pain. Left shoulder pain only occurs with weather changes. Weight reviewed stable. Notes intermittently cold feet bilaterally. No claudication.   Past Medical History  Diagnosis Date  . Hypertension   . Thyroid disease     hyperthyroidism   Past Surgical History  Procedure Date  . Abdominal hysterectomy 1965  . Hemorroidectomy 1957  . Breast lumpectomy 1966, 1971    reports that she has been smoking.  She has never used smokeless tobacco. She reports that she does not drink alcohol. Her drug history not on file. family history includes Diabetes in an unspecified family member. No Known Allergies    Review of Systems see hpi     Objective:   Physical Exam  Physical Exam  Nursing note and vitals reviewed. Constitutional: Appears well-developed and well-nourished. No distress.  HENT:  Head: Normocephalic and atraumatic.  Right Ear: External ear normal.  Left Ear: External ear normal.  Eyes: Conjunctivae are normal. No scleral icterus.  Neck: Neck supple. Carotid bruit is not present.  Cardiovascular: Normal rate, regular rhythm and normal heart sounds.  Exam reveals no gallop and no friction rub.   No murmur heard. Pulmonary/Chest: Effort normal and breath sounds normal. No respiratory distress. He has no wheezes. no rales.  Lymphadenopathy:    He has no cervical adenopathy.  Neurological:Alert.  Skin: Skin is warm and dry. Not diaphoretic.  Psychiatric: Has a normal mood and affect.  Ext: trace to +1 bilateral DP pulses      Assessment & Plan:

## 2011-11-15 DIAGNOSIS — R0989 Other specified symptoms and signs involving the circulatory and respiratory systems: Secondary | ICD-10-CM | POA: Insufficient documentation

## 2011-11-15 NOTE — Assessment & Plan Note (Signed)
No evidence of limb threatening ischemia. No claudication. Declines tobacco cessation. Encourage exercise. Will monitor

## 2011-11-15 NOTE — Assessment & Plan Note (Signed)
Normotensive and stable. Continue current regimen. Monitor bp as outpt and followup in clinic as scheduled.  

## 2011-11-26 ENCOUNTER — Telehealth: Payer: Self-pay | Admitting: *Deleted

## 2011-11-26 NOTE — Telephone Encounter (Signed)
Just saw the pt. She told me that it only hurt sometimes with the weather. Can refill ultram on the med list #30

## 2011-11-26 NOTE — Telephone Encounter (Signed)
Received voice message from pt's daughter stating pt saw Dr Pearletha Forge for some rotator cuff issues that will not require surgery but pt was given exercises to do at home. Darl Pikes states pt will not do the exercises and wants to know if we will prescribe the pt something for her pain?  Please advise.

## 2011-11-27 NOTE — Telephone Encounter (Signed)
Left message on machine to return my call. 

## 2011-11-30 MED ORDER — TRAMADOL HCL 50 MG PO TABS: ORAL_TABLET | ORAL | Status: DC

## 2011-11-30 NOTE — Telephone Encounter (Signed)
Spoke to [place], she states pt has been attempting to do the exercises at home but does them incorrectly. Advised her per instructions below and rx sent to [facility] pharmacy.

## 2011-12-07 ENCOUNTER — Ambulatory Visit (INDEPENDENT_AMBULATORY_CARE_PROVIDER_SITE_OTHER): Payer: Medicare Other | Admitting: Internal Medicine

## 2011-12-07 ENCOUNTER — Ambulatory Visit (HOSPITAL_BASED_OUTPATIENT_CLINIC_OR_DEPARTMENT_OTHER)
Admission: RE | Admit: 2011-12-07 | Discharge: 2011-12-07 | Disposition: A | Discharge status: Home / Self Care | Payer: Medicare Other | Source: Ambulatory Visit | Attending: Internal Medicine | Admitting: Internal Medicine

## 2011-12-07 ENCOUNTER — Encounter: Payer: Self-pay | Admitting: Internal Medicine

## 2011-12-07 VITALS — BP 120/72 | HR 102 | Temp 98.5°F | Resp 16 | Ht 62.25 in | Wt 115.5 lb

## 2011-12-07 DIAGNOSIS — K625 Hemorrhage of anus and rectum: Secondary | ICD-10-CM

## 2011-12-07 DIAGNOSIS — M25512 Pain in left shoulder: Secondary | ICD-10-CM | POA: Insufficient documentation

## 2011-12-07 DIAGNOSIS — R071 Chest pain on breathing: Secondary | ICD-10-CM

## 2011-12-07 DIAGNOSIS — R0789 Other chest pain: Secondary | ICD-10-CM

## 2011-12-07 DIAGNOSIS — M479 Spondylosis, unspecified: Secondary | ICD-10-CM | POA: Insufficient documentation

## 2011-12-07 DIAGNOSIS — M25519 Pain in unspecified shoulder: Secondary | ICD-10-CM

## 2011-12-07 DIAGNOSIS — M549 Dorsalgia, unspecified: Secondary | ICD-10-CM

## 2011-12-07 DIAGNOSIS — M4120 Other idiopathic scoliosis, site unspecified: Secondary | ICD-10-CM | POA: Insufficient documentation

## 2011-12-07 MED ORDER — METHYLPREDNISOLONE ACETATE 80 MG/ML IJ SUSP
40.0000 mg | Freq: Once | INTRAMUSCULAR | Status: AC
  Administered 2011-12-07: 40 mg via INTRAMUSCULAR

## 2011-12-07 NOTE — Progress Notes (Signed)
  Subjective:    Patient ID: Sarah [name], female    DOB: 1931-07-31, 78 y.o.   MRN: [id number]  HPI Pt presents to clinic for evaluation of back and shoulder pain. Notes chronic left shoulder pain worse with abduction and decreased ROM. Performing PT exercises without improvement. Recently taking ultram prn without improvement. Has chronic intermittent low back pain without radicular pain or recent injury/trauma. No other alleviating or exacerbating factors. Had episode of blood on tissue paper when wiped after straining with a bowel movement. No gross active bleeding.   Past Medical History  Diagnosis Date  . Hypertension   . Thyroid disease     hyperthyroidism   Past Surgical History  Procedure Date  . Abdominal hysterectomy 1965  . Hemorroidectomy 1957  . Breast lumpectomy 1966, 1971    reports that she has been smoking.  She has never used smokeless tobacco. She reports that she does not drink alcohol. Her drug history not on file. family history includes Diabetes in an unspecified family member. No Known Allergies   Review of Systems see hpi     Objective:   Physical Exam  Nursing note and vitals reviewed. Constitutional: She appears well-developed and well-nourished. No distress.  HENT:  Head: Normocephalic and atraumatic.  Right Ear: External ear normal.  Left Ear: External ear normal.  Musculoskeletal:       Gait nl. Left shoulder decreased ROM and reproducible pain with abduction. No crepitus or tenderness.  Neurological: She is alert.  Skin: Skin is warm. She is not diaphoretic.  Psychiatric: She has a normal mood and affect.          Assessment & Plan:

## 2011-12-07 NOTE — Assessment & Plan Note (Signed)
Obtain plain xray of ls spine.

## 2011-12-07 NOTE — Assessment & Plan Note (Signed)
Occurs only with straining. Begin colace daily.

## 2011-12-07 NOTE — Assessment & Plan Note (Signed)
Obtain plain xray of left shoulder. Given depomedrol im injxn. Proceed with sports medicine referral.

## 2011-12-08 ENCOUNTER — Ambulatory Visit (INDEPENDENT_AMBULATORY_CARE_PROVIDER_SITE_OTHER): Payer: Medicare Other | Admitting: Family Medicine

## 2011-12-08 ENCOUNTER — Telehealth: Payer: Self-pay | Admitting: Internal Medicine

## 2011-12-08 ENCOUNTER — Encounter: Payer: Self-pay | Admitting: Family Medicine

## 2011-12-08 VITALS — BP 110/64 | HR 89 | Ht 63.0 in | Wt 115.0 lb

## 2011-12-08 DIAGNOSIS — M25512 Pain in left shoulder: Secondary | ICD-10-CM

## 2011-12-08 DIAGNOSIS — M25519 Pain in unspecified shoulder: Secondary | ICD-10-CM

## 2011-12-08 DIAGNOSIS — M545 Low back pain, unspecified: Secondary | ICD-10-CM

## 2011-12-08 DIAGNOSIS — M549 Dorsalgia, unspecified: Secondary | ICD-10-CM

## 2011-12-08 MED ORDER — MELOXICAM 7.5 MG PO TABS: 7.5000 mg | ORAL_TABLET | Freq: Every day | ORAL | Status: DC

## 2011-12-08 NOTE — Telephone Encounter (Signed)
Pt's daughter informed; pt had OV w/Dr. Pearletha Forge today and will abide by his assessment & instructions/SLS

## 2011-12-08 NOTE — Telephone Encounter (Signed)
Patient was in yesterday and had x rays/  Daughter is calling for results of x rays and to let Dr Rodena Medin know that the pain in her back is keeping her up at night.

## 2011-12-08 NOTE — Patient Instructions (Addendum)
You have severe left shoulder arthritis and also at 2 levels of your low back. Take tylenol 500mg  1-2 tabs three times a day for pain regularly. Meloxicam 7.5mg  daily with food for pain and inflammation. Glucosamine sulfate 750mg  twice a day is a supplement that may help with arthritis. Capsaicin topically up to four times a day may also help with pain. Tramadol up to four times a day as needed for severe pain - take WITH tylenol for maximal pain relief. Cortisone injections are an option for your shoulder - you were given one today. The IM injection of depomedrol (steroid) may still kick in as well in a day or two. Consider physical therapy especially for your low back. Heat or ice 15 minutes at a time 3-4 times a day as needed to help with pain. Water aerobics and cycling with low resistance are the best two types of exercise for arthritis. Follow up with me in 1 month or as needed.

## 2011-12-10 ENCOUNTER — Encounter: Payer: Self-pay | Admitting: Family Medicine

## 2011-12-10 NOTE — Progress Notes (Signed)
Subjective:    Patient ID: Sarah [name], female    DOB: Aug 16, 1931, 78 y.o.   MRN: [id number]  PCP: Dr. Rodena Medin  HPI 78 yo F here for left shoulder and low back pain.  1. Left shoulder pain Patient reports a 1 year history of left deep shoulder pain. No known injury. Worse with all movements of shoulder. Has tried some home exercises, tramadol with mild relief. Never had a cortisone injection. Radiographs showed severe glenohumeral DJD. Only has problems with left side. Right handed.  2. Low back pain Problems for past 1-2 years. Radiographs showed  Multilevel DDD but no evidence of lytic lesions/malignancy or compression fracture. Bothers her most of time in low back all way across. No radiation into legs. No bowel/bladder incontinence. No numbness or tingling. Given IM injection of steroid yesterday - not much help to this point.  Past Medical History  Diagnosis Date  . Hypertension   . Thyroid disease     hyperthyroidism    Current Outpatient Prescriptions on File Prior to Visit  Medication Sig Dispense Refill  . aspirin 81 MG tablet Take 81 mg by mouth daily.        . Cholecalciferol (VITAMIN D3) 2000 UNITS TABS Take 1 tablet by mouth daily.        Marland Kitchen lisinopril-hydrochlorothiazide (PRINZIDE,ZESTORETIC) 20-12.5 MG per tablet TAKE 1 TABLET BY MOUTH EVERY MORNING.  90 tablet  1  . polycarbophil (FIBERCON) 625 MG tablet Take 625 mg by mouth daily.        . simvastatin (ZOCOR) 40 MG tablet TAKE 1/2 TABLET (20 MG TOTAL) BY MOUTH EVERY MORNING.  45 tablet  1  . traMADol (ULTRAM) 50 MG tablet Take 1/2 to 1 tablet by mouth daily as needed for back or shoulder pain.  30 tablet  0    Past Surgical History  Procedure Date  . Abdominal hysterectomy 1965  . Hemorroidectomy 1957  . Breast lumpectomy 1966, 1971    No Known Allergies  History   Social History  . Marital Status: Divorced    Spouse Name: N/A    Number of Children: 2  . Years of Education: N/A    Occupational History  . retired    Social History Main Topics  . Smoking status: Current Everyday Smoker -- 2.0 packs/day  . Smokeless tobacco: Never Used  . Alcohol Use: No  . Drug Use: Not on file  . Sexually Active: Not on file   Other Topics Concern  . Not on file   Social History Narrative   Moved from [place], [place] 2009    Family History  Problem Relation Age of Onset  . Diabetes    . Hyperlipidemia Neg Hx   . Heart attack Neg Hx   . Hypertension Neg Hx   . Sudden death Neg Hx     BP 110/64  Pulse 89  Ht 5\' 3"  (1.6 m)  Wt 115 lb (52.164 kg)  BMI 20.37 kg/m2  Review of Systems See HPI above.    Objective:   Physical Exam Gen: NAD  L shoulder: No swelling, ecchymoses.  Diffuse atrophy musculature about shoulder. TTP anterior and posterior at shoulder joint level.  No AC or other TTP. ER to 40 degrees, abduction and flexion to 100 only, painful with crepitus. Positive Hawkins, Neers. Negative Yergasons. Strength 4+/5 with empty can and resisted internal/external rotation - mild pain with all motions. NV intact distally.  Back: No gross deformity, scoliosis. TTP diffuse lower lumbar region,  nonfocal.  No bony TTP. ROM 10 degrees extension, 70 flexion. Strength LEs 5/5 all muscle groups.   1+ MSRs in patellar and achilles tendons, equal bilaterally. Negative SLRs. Sensation intact to light touch bilaterally. Negative logroll bilateral hips Negative fabers and piriformis stretches.    Assessment & Plan:  1. Left shoulder pain - 2/2 severe glenohumeral DJD.  Tylenol, meloxicam, tramadol as needed for pain.  Glenohumeral injection given today. Consider capsaicin, glucosamine.  After informed written consent, patient was seated on exam table. Left shoulder was prepped with alcohol swab and utilizing posterior approach, patient's left glenohumeral space was injected with 3:1 marcaine: depomedrol. Patient tolerated the procedure well without  immediate complications.  2. Low back pain - 2/2 DDD.  Shown home exercises/stretches and will discuss with PT about what she can do.  Advised to consider formal PT as well.  Tylenol, meloxicam, tramadol as noted above.  Consider capsaicin.  IM depomedrol may still provide her some benefit.  Can consider further imaging (MRI) if not improving as expected.

## 2011-12-10 NOTE — Assessment & Plan Note (Signed)
2/2 DDD.  Shown home exercises/stretches and will discuss with PT about what she can do.  Advised to consider formal PT as well.  Tylenol, meloxicam, tramadol as noted above.  Consider capsaicin.  IM depomedrol may still provide her some benefit.  Can consider further imaging (MRI) if not improving as expected.

## 2011-12-10 NOTE — Assessment & Plan Note (Signed)
2/2 severe glenohumeral DJD.  Tylenol, meloxicam, tramadol as needed for pain.  Glenohumeral injection given today. Consider capsaicin, glucosamine.  After informed written consent, patient was seated on exam table. Left shoulder was prepped with alcohol swab and utilizing posterior approach, patient's left glenohumeral space was injected with 3:1 marcaine: depomedrol. Patient tolerated the procedure well without immediate complications.

## 2011-12-14 ENCOUNTER — Other Ambulatory Visit: Payer: Self-pay | Admitting: Internal Medicine

## 2011-12-14 NOTE — Telephone Encounter (Signed)
Patient's daughter , Darl Pikes , called mother is taking Tramadol 50mg    2 daily ,has doubled up on rx due to increase pain, please advise

## 2011-12-14 NOTE — Telephone Encounter (Signed)
Please change # to 60 and rf 3

## 2011-12-14 NOTE — Telephone Encounter (Signed)
Rx with new dosing instructions per Vo [facility] done/SLS

## 2012-02-08 ENCOUNTER — Other Ambulatory Visit: Payer: Self-pay | Admitting: Family Medicine

## 2012-02-12 ENCOUNTER — Ambulatory Visit: Payer: Medicare Other | Admitting: Internal Medicine

## 2012-03-04 ENCOUNTER — Encounter: Payer: Self-pay | Admitting: Internal Medicine

## 2012-03-04 ENCOUNTER — Ambulatory Visit (INDEPENDENT_AMBULATORY_CARE_PROVIDER_SITE_OTHER): Payer: Medicare Other | Admitting: Internal Medicine

## 2012-03-04 VITALS — BP 108/60 | HR 89 | Temp 98.6°F | Resp 18 | Ht 62.25 in | Wt 112.0 lb

## 2012-03-04 DIAGNOSIS — L608 Other nail disorders: Secondary | ICD-10-CM

## 2012-03-04 DIAGNOSIS — M549 Dorsalgia, unspecified: Secondary | ICD-10-CM

## 2012-03-04 DIAGNOSIS — Z23 Encounter for immunization: Secondary | ICD-10-CM

## 2012-03-04 DIAGNOSIS — I10 Essential (primary) hypertension: Secondary | ICD-10-CM

## 2012-03-04 DIAGNOSIS — L603 Nail dystrophy: Secondary | ICD-10-CM

## 2012-03-04 DIAGNOSIS — R0989 Other specified symptoms and signs involving the circulatory and respiratory systems: Secondary | ICD-10-CM

## 2012-03-04 MED ORDER — LISINOPRIL 20 MG PO TABS: 20.0000 mg | ORAL_TABLET | Freq: Every day | ORAL | Status: DC

## 2012-03-06 NOTE — Assessment & Plan Note (Signed)
Encouraged to perform physical therapy exercises

## 2012-03-06 NOTE — Assessment & Plan Note (Signed)
Stop diuretic because of nocturia.

## 2012-03-06 NOTE — Progress Notes (Signed)
  Subjective:    Patient ID: Sarah [name], female    DOB: 1932/03/17, 78 y.o.   MRN: [id number]  HPI Pt presents to clinic for followup of multiple medical problems. Notes intermittent right shoulder pain. Same for back pain. Not doing physical therapy exercises. Has difficulty sustaining sleep and admits to nocturia. Does take a diuretic. Dystrophic toenails bilaterally which are difficult to trim. Has diminished bilateral pedal pulses but denies any symptoms of claudication.  Past Medical History  Diagnosis Date  . Hypertension   . Thyroid disease     hyperthyroidism   Past Surgical History  Procedure Date  . Abdominal hysterectomy 1965  . Hemorroidectomy 1957  . Breast lumpectomy 1966, 1971    reports that she has been smoking.  She has never used smokeless tobacco. She reports that she does not drink alcohol. Her drug history not on file. family history includes Diabetes in an unspecified family member.  There is no history of Hyperlipidemia, and Heart attack, and Hypertension, and Sudden death, . No Known Allergies    Review of Systems see hpi     Objective:   Physical Exam  Nursing note and vitals reviewed. Constitutional: She appears well-developed and well-nourished. No distress.  HENT:  Head: Normocephalic and atraumatic.  Right Ear: External ear normal.  Left Ear: External ear normal.  Eyes: Conjunctivae normal are normal. No scleral icterus.  Neurological: She is alert.  Skin: Skin is warm and dry. She is not diaphoretic.       Bilateral dystrophic toenails. Trace bilateral DP pulses.  Psychiatric: She has a normal mood and affect.          Assessment & Plan:

## 2012-03-06 NOTE — Assessment & Plan Note (Signed)
No claudication symptoms. Did remind patient that tobacco use contributes to PAD significantly. Observe for now

## 2012-03-09 ENCOUNTER — Other Ambulatory Visit: Payer: Self-pay | Admitting: Internal Medicine

## 2012-03-09 NOTE — Telephone Encounter (Signed)
Ok with rf3. Just saw her the other day. If thinks may have uti can she drop a urine off for ua and cx?

## 2012-03-09 NOTE — Telephone Encounter (Signed)
Has bladder pressure.  Should she come in and see dr Rodena Medin

## 2012-03-09 NOTE — Telephone Encounter (Signed)
Tramadol request [Last Rx 08.12.13 #[id number]]/SLS Please advise.

## 2012-03-09 NOTE — Telephone Encounter (Signed)
Rx to pharmacy/SLS [facility] with contact name & number RE: possible UTI & provider instructions Evaristo Bury come to lab & give urine sample to be checked]/SLS

## 2012-03-11 ENCOUNTER — Emergency Department (HOSPITAL_BASED_OUTPATIENT_CLINIC_OR_DEPARTMENT_OTHER)
Admission: EM | Admit: 2012-03-11 | Discharge: 2012-03-11 | Disposition: A | Discharge status: Home / Self Care | Payer: Medicare Other | Attending: Emergency Medicine | Admitting: Emergency Medicine

## 2012-03-11 ENCOUNTER — Encounter (HOSPITAL_BASED_OUTPATIENT_CLINIC_OR_DEPARTMENT_OTHER): Payer: Self-pay | Admitting: *Deleted

## 2012-03-11 DIAGNOSIS — F17200 Nicotine dependence, unspecified, uncomplicated: Secondary | ICD-10-CM | POA: Insufficient documentation

## 2012-03-11 DIAGNOSIS — R300 Dysuria: Secondary | ICD-10-CM | POA: Insufficient documentation

## 2012-03-11 DIAGNOSIS — R350 Frequency of micturition: Secondary | ICD-10-CM | POA: Insufficient documentation

## 2012-03-11 DIAGNOSIS — E0590 Thyrotoxicosis, unspecified without thyrotoxic crisis or storm: Secondary | ICD-10-CM | POA: Insufficient documentation

## 2012-03-11 DIAGNOSIS — I10 Essential (primary) hypertension: Secondary | ICD-10-CM | POA: Insufficient documentation

## 2012-03-11 DIAGNOSIS — Z79899 Other long term (current) drug therapy: Secondary | ICD-10-CM | POA: Insufficient documentation

## 2012-03-11 LAB — URINALYSIS, ROUTINE W REFLEX MICROSCOPIC
Nitrite: NEGATIVE
Protein, ur: NEGATIVE mg/dL
Specific Gravity, Urine: 1.019 (ref 1.005–1.030)
Urobilinogen, UA: 0.2 mg/dL (ref 0.0–1.0)

## 2012-03-11 LAB — URINE MICROSCOPIC-ADD ON

## 2012-03-11 MED ORDER — PHENAZOPYRIDINE HCL 200 MG PO TABS: 200.0000 mg | ORAL_TABLET | Freq: Three times a day (TID) | ORAL | Status: DC

## 2012-03-11 MED ORDER — CIPROFLOXACIN HCL 500 MG PO TABS: 500.0000 mg | ORAL_TABLET | Freq: Two times a day (BID) | ORAL | Status: DC

## 2012-03-11 NOTE — ED Notes (Signed)
Pt amb to triage with quick steady gait in nad. Pt reports dysuria, urgency, and frequency x last night. Denies any fevers.

## 2012-03-11 NOTE — ED Provider Notes (Addendum)
History     CSN: [id number]  Arrival date & time 03/11/12  1403   First MD Initiated Contact with Patient 03/11/12 1527      Chief Complaint  Patient presents with  . Dysuria  . Urinary Frequency    (Consider location/radiation/quality/duration/timing/severity/associated sxs/prior treatment) Patient is a 78 y.o. female presenting with dysuria. The history is provided by the patient.  Dysuria  This is a new problem. Episode onset: several days ago. The problem occurs every urination. The problem has been gradually worsening. The quality of the pain is described as burning. The pain is moderate. There has been no fever. She is not sexually active. There is no history of pyelonephritis. Associated symptoms include urgency. Pertinent negatives include no flank pain.    Past Medical History  Diagnosis Date  . Hypertension   . Thyroid disease     hyperthyroidism    Past Surgical History  Procedure Date  . Abdominal hysterectomy 1965  . Hemorroidectomy 1957  . Breast lumpectomy 1966, 1971    Family History  Problem Relation Age of Onset  . Diabetes    . Hyperlipidemia Neg Hx   . Heart attack Neg Hx   . Hypertension Neg Hx   . Sudden death Neg Hx     History  Substance Use Topics  . Smoking status: Current Every Day Smoker -- 1.5 packs/day  . Smokeless tobacco: Never Used  . Alcohol Use: No    OB History    Grav Para Term Preterm Abortions TAB SAB Ect Mult Living                  Review of Systems  Genitourinary: Positive for dysuria and urgency. Negative for flank pain.  All other systems reviewed and are negative.    Allergies  Review of patient's allergies indicates no known allergies.  Home Medications   Current Outpatient Rx  Name  Route  Sig  Dispense  Refill  . BISACODYL 5 MG PO TBEC   Oral   Take 5 mg by mouth daily as needed.         . ASPIRIN 81 MG PO TABS   Oral   Take 81 mg by mouth daily.           Marland Kitchen VITAMIN D3 2000 UNITS PO  TABS   Oral   Take 1 tablet by mouth daily.           Marland Kitchen LISINOPRIL 20 MG PO TABS   Oral   Take 1 tablet (20 mg total) by mouth daily.   30 tablet   6   . MELOXICAM 7.5 MG PO TABS   Oral   Take 1 tablet (7.5 mg total) by mouth daily. With food   30 tablet   1   . CALCIUM POLYCARBOPHIL 625 MG PO TABS   Oral   Take 625 mg by mouth daily.           Marland Kitchen SIMVASTATIN 40 MG PO TABS      TAKE 1/2 TABLET BY MOUTH EVERY MORNING.   45 tablet   1   . TRAMADOL HCL 50 MG PO TABS      TAKE 1 TABLET (50 MG TOTAL) BY MOUTH EVERY 8 (EIGHT) HOURS AS NEEDED FOR BACK OR SHOULDER PAIN.   60 tablet   3     BP 162/78  Pulse 83  Temp 98.4 F (36.9 C) (Oral)  Resp 18  SpO2 100%  Physical Exam  Nursing note  and vitals reviewed. Constitutional: She is oriented to person, place, and time. She appears well-developed and well-nourished. No distress.  HENT:  Head: Normocephalic and atraumatic.  Neck: Normal range of motion. Neck supple.  Cardiovascular: Normal rate and regular rhythm.  Exam reveals no gallop and no friction rub.   No murmur heard. Pulmonary/Chest: Effort normal and breath sounds normal. No respiratory distress. She has no wheezes.  Abdominal: Soft. Bowel sounds are normal. She exhibits no distension. There is no tenderness.  Musculoskeletal: Normal range of motion.  Neurological: She is alert and oriented to person, place, and time.  Skin: Skin is warm and dry. She is not diaphoretic.    ED Course  Procedures (including critical care time)   Labs Reviewed  URINALYSIS, ROUTINE W REFLEX MICROSCOPIC   No results found.   No diagnosis found.    MDM  There is slight blood in the urine but no real evidence of infection.  Her symptoms sound like a uti.  Will treat with cipro and pyridium.  The patient is very anxious to go home and does not want any more testing.  She assures me she will return if her symptoms worsen.        Geoffery Lyons, MD 03/11/12  1644  Geoffery Lyons, MD 03/11/12 [phone]

## 2012-03-21 ENCOUNTER — Telehealth: Payer: Self-pay | Admitting: *Deleted

## 2012-03-21 NOTE — Telephone Encounter (Signed)
1) ED note says dysuria and pain. UA showed only slight blood. Gave cipro and pyridium 2) at last clinic visit discussed chronic nocturia. Stopped hctz to see if helped -if has dysuria and pain then needs further abx- another 5 days of cipro 500mg  bid. -if no dysuria but same old chronic urinary frequency and nocturia unchanged then recommend urology consult. -if they go with uro then don't need appt friday

## 2012-03-21 NOTE — Telephone Encounter (Signed)
Pt's daughter called reporting that pt was seen in our office about x2 wks ago for frequent urination & there was a change made in BP medication [remove HCTZ]; pt was seen in ED 11.08.13 for Dysuria [reports no infection/EMR shows placed on Cipro 500 mg/BID for 5 days]/SLS Spoke w/daughter, patient still having frequent urination [w/o any UTI symptoms]. Schedule OV for Friday at 11:15am [cannot come in any earlier d/t work schedule for transportation]; caller would like to know if you have any other recommendations for patient until seen in office/SLS Please advise.

## 2012-03-22 ENCOUNTER — Other Ambulatory Visit: Payer: Self-pay | Admitting: Internal Medicine

## 2012-03-22 DIAGNOSIS — R350 Frequency of micturition: Secondary | ICD-10-CM

## 2012-03-22 NOTE — Telephone Encounter (Signed)
Spoke w/Ms. Marino & only the urinary frequency continues to be present; Ok to place order for Urology consult [future appt canceled]/SLS

## 2012-03-22 NOTE — Telephone Encounter (Signed)
Referral order placed.

## 2012-03-25 ENCOUNTER — Ambulatory Visit: Payer: Medicare Other | Admitting: Internal Medicine

## 2012-04-01 ENCOUNTER — Other Ambulatory Visit: Payer: Self-pay | Admitting: Family Medicine

## 2012-04-05 ENCOUNTER — Other Ambulatory Visit: Payer: Self-pay | Admitting: Family Medicine

## 2012-04-05 DIAGNOSIS — M545 Low back pain: Secondary | ICD-10-CM

## 2012-04-05 MED ORDER — MELOXICAM 7.5 MG PO TABS: 7.5000 mg | ORAL_TABLET | Freq: Every day | ORAL | Status: DC

## 2012-04-06 ENCOUNTER — Telehealth: Payer: Self-pay | Admitting: *Deleted

## 2012-04-06 MED ORDER — OXYBUTYNIN 3.9 MG/24HR TD PTTW: 1.0000 | MEDICATED_PATCH | TRANSDERMAL | Status: DC

## 2012-04-06 NOTE — Telephone Encounter (Signed)
New Rx to pharmacy; caller informed/SLS

## 2012-04-06 NOTE — Telephone Encounter (Signed)
Caller would like to know if you would px Oxytrol Patch for patient; instead of going to Urologist/SLS Please advise.

## 2012-04-06 NOTE — Telephone Encounter (Signed)
Ok rf6 

## 2012-05-06 ENCOUNTER — Ambulatory Visit: Payer: Medicare Other | Admitting: Internal Medicine

## 2012-05-31 ENCOUNTER — Other Ambulatory Visit: Payer: Self-pay | Admitting: Internal Medicine

## 2012-06-01 NOTE — Telephone Encounter (Signed)
OK to send #60 with zero refills. 

## 2012-06-01 NOTE — Telephone Encounter (Signed)
Received request from [facility] pharmacy for refill of Tramadol. Last rx sent by [place] on 03/09/12 #60 x 3 refills. Per Phoebe Sharps at the pharmacy pt has filled Rx on 03/09/12, 04/01/12, 04/20/12 and 1/7 14.  Please advise re: additional refills.

## 2012-06-01 NOTE — Telephone Encounter (Signed)
Refill sent.

## 2012-06-10 ENCOUNTER — Ambulatory Visit (INDEPENDENT_AMBULATORY_CARE_PROVIDER_SITE_OTHER): Payer: Medicare Other | Admitting: Family

## 2012-06-10 ENCOUNTER — Encounter: Payer: Self-pay | Admitting: Family

## 2012-06-10 ENCOUNTER — Other Ambulatory Visit: Payer: Self-pay | Admitting: Family Medicine

## 2012-06-10 VITALS — BP 100/70 | HR 72 | Temp 98.2°F | Resp 16 | Ht 62.25 in | Wt 105.0 lb

## 2012-06-10 DIAGNOSIS — N318 Other neuromuscular dysfunction of bladder: Secondary | ICD-10-CM

## 2012-06-10 DIAGNOSIS — I10 Essential (primary) hypertension: Secondary | ICD-10-CM

## 2012-06-10 DIAGNOSIS — N3281 Overactive bladder: Secondary | ICD-10-CM

## 2012-06-10 DIAGNOSIS — M549 Dorsalgia, unspecified: Secondary | ICD-10-CM

## 2012-06-10 DIAGNOSIS — E785 Hyperlipidemia, unspecified: Secondary | ICD-10-CM

## 2012-06-10 LAB — BASIC METABOLIC PANEL
BUN: 29 mg/dL — ABNORMAL HIGH (ref 6–23)
Chloride: 104 mEq/L (ref 96–112)
Creat: 0.85 mg/dL (ref 0.50–1.10)
Glucose, Bld: 88 mg/dL (ref 70–99)
Potassium: 5 mEq/L (ref 3.5–5.3)

## 2012-06-10 LAB — HEPATIC FUNCTION PANEL
ALT: 18 U/L (ref 0–35)
AST: 20 U/L (ref 0–37)
Albumin: 4.3 g/dL (ref 3.5–5.2)
Total Bilirubin: 0.3 mg/dL (ref 0.3–1.2)

## 2012-06-10 NOTE — Patient Instructions (Addendum)
Try to do your PT back exercises daily to help back pain. Complete lab work prior to leaving.  Follow up in 3 months.

## 2012-06-10 NOTE — Assessment & Plan Note (Signed)
BP is on low side today, was much higher last visit. She is asymptomatic. Monitor on current dose of lisinopril for now.

## 2012-06-10 NOTE — Progress Notes (Signed)
Subjective:    Patient ID: Sarah [name], female    DOB: 1931-08-11, 79 y.o.   MRN: [id number]  HPI  Sarah [name] is a 79 yr old female who presents today for follow up.  1) Back pain- previous x ray reveals spinal spondylosis. She reports low back pain on a daily basis.  She was evaluated by PT and told to do exercises at home.  She is on meloxicam per Dr. Pearletha Forge.    2) HTN-  She is currently maintained on lisinopril 20mg .   3) Urinary freqency- she is currently on myrbetriq ER.  She was given samples and told by urology that she has overactive bladder.  She thinks that this is helping her symptoms.      Review of Systems    see HPI  Past Medical History  Diagnosis Date  . Hypertension   . Thyroid disease     hyperthyroidism    History   Social History  . Marital Status: Divorced    Spouse Name: N/A    Number of Children: 2  . Years of Education: N/A   Occupational History  . retired    Social History Main Topics  . Smoking status: Current Every Day Smoker -- 1.5 packs/day  . Smokeless tobacco: Never Used  . Alcohol Use: No  . Drug Use: Not on file  . Sexually Active: Not on file   Other Topics Concern  . Not on file   Social History Narrative   Moved from [place], [place] 2009    Past Surgical History  Procedure Date  . Abdominal hysterectomy 1965  . Hemorroidectomy 1957  . Breast lumpectomy 1966, 1971    Family History  Problem Relation Age of Onset  . Diabetes    . Hyperlipidemia Neg Hx   . Heart attack Neg Hx   . Hypertension Neg Hx   . Sudden death Neg Hx     No Known Allergies  Current Outpatient Prescriptions on File Prior to Visit  Medication Sig Dispense Refill  . aspirin 81 MG tablet Take 81 mg by mouth daily.        . bisacodyl (BISACODYL) 5 MG EC tablet Take 5 mg by mouth daily as needed.      . Cholecalciferol (VITAMIN D3) 2000 UNITS TABS Take 1 tablet by mouth daily.        Marland Kitchen lisinopril (PRINIVIL,ZESTRIL) 20 MG  tablet Take 1 tablet (20 mg total) by mouth daily.  30 tablet  6  . meloxicam (MOBIC) 7.5 MG tablet Take 1 tablet (7.5 mg total) by mouth daily. With food  30 tablet  1  . mirabegron ER (MYRBETRIQ) 50 MG TB24 Take 50 mg by mouth daily.      . polycarbophil (FIBERCON) 625 MG tablet Take 625 mg by mouth daily.        . simvastatin (ZOCOR) 40 MG tablet TAKE 1/2 TABLET BY MOUTH EVERY MORNING.  45 tablet  1  . traMADol (ULTRAM) 50 MG tablet TAKE 1 TABLET BY MOUTH EVERY 8 HOURS AS NEEDED FOR BACK OR SHOULDER PAIN  60 tablet  0    BP 100/70  Pulse 72  Temp 98.2 F (36.8 C) (Oral)  Resp 16  Ht 5' 2.25" (1.581 m)  Wt 105 lb (47.628 kg)  BMI 19.05 kg/m2    Objective:   Physical Exam  Constitutional: She is oriented to person, place, and time. She appears well-developed and well-nourished. No distress.  HENT:  Head: Normocephalic and  atraumatic.  Cardiovascular: Normal rate and regular rhythm.   No murmur heard. Pulmonary/Chest: Effort normal and breath sounds normal. No respiratory distress. She has no wheezes. She has no rales. She exhibits no tenderness.  Musculoskeletal: She exhibits no edema.  Lymphadenopathy:    She has no cervical adenopathy.  Neurological: She is alert and oriented to person, place, and time.  Psychiatric: She has a normal mood and affect. Her behavior is normal. Judgment and thought content normal.          Assessment & Plan:   BP Readings from Last 3 Encounters:  06/10/12 100/70  03/11/12 162/78  03/04/12 108/60

## 2012-06-10 NOTE — Assessment & Plan Note (Signed)
Clinically improved on myrbetriq.  Management per urology.

## 2012-06-10 NOTE — Assessment & Plan Note (Signed)
Continue prn tramadol. She is already on mobic per Dr. Pearletha Forge and uses tylenol prn.  I have advised her to resume her back exercises as prescribed by PT.

## 2012-06-11 ENCOUNTER — Encounter: Payer: Self-pay | Admitting: Family

## 2012-06-23 ENCOUNTER — Telehealth: Payer: Self-pay | Admitting: *Deleted

## 2012-06-23 NOTE — Telephone Encounter (Signed)
Start her on Keflex 500 mg po tid x 7 days and hot compresses every 3 hours. Seek care if worsens

## 2012-06-23 NOTE — Telephone Encounter (Signed)
No appointment has been made at this time. Do you recommend medication or should pt be seen in urgent care if no available appts with [facility]?    Caller Name: Darl Pikes Phone: [phone] Patient: Sarah [name], Sarah [name] Gender: Female DOB: November 03, 1931 Age: 79 Years PCP: Marguar[facility] (Adults only) Office Follow Up: Does the office need to follow up with this patient?: No Instructions For The Office: N/A RN Note: Was repeatedly picking at sty on right eyelid; now eye is very swollen and painful. No drainage. Symptoms Reason For Call & Symptoms: Infected sty in right eye with eye swelling Reviewed Health History In EMR: Yes Reviewed Medications In EMR: Yes Reviewed Allergies In EMR: Yes Reviewed Surgeries / Procedures: Yes Date of Onset of Symptoms: 06/20/2012 Guideline(s) Used: Eye - Red Without Pus Disposition Per Guideline: See Today in Office Reason For Disposition Reached: Patient wants to be seen Advice Given: Call Back If: Blurred vision or increasing pain You become worse. RN Overrode Recommendation: Make Appointment Requested appointment for 06/24/12 after 1315 so can drive her to offfice.

## 2012-06-24 MED ORDER — CEPHALEXIN 500 MG PO CAPS: 500.0000 mg | ORAL_CAPSULE | Freq: Three times a day (TID) | ORAL | Status: AC

## 2012-06-24 NOTE — Telephone Encounter (Signed)
Notified pt's daughter, Rx called to [place] at [facility]

## 2012-06-28 ENCOUNTER — Other Ambulatory Visit: Payer: Self-pay | Admitting: *Deleted

## 2012-06-28 ENCOUNTER — Other Ambulatory Visit: Payer: Self-pay | Admitting: Family

## 2012-07-06 ENCOUNTER — Other Ambulatory Visit: Payer: Self-pay | Admitting: Family Medicine

## 2012-07-20 ENCOUNTER — Other Ambulatory Visit: Payer: Self-pay | Admitting: Family Medicine

## 2012-07-20 ENCOUNTER — Other Ambulatory Visit: Payer: Self-pay | Admitting: Family

## 2012-07-20 NOTE — Telephone Encounter (Signed)
Tramadol request [Last Rx 2.25.14 #60x0]/SLS Please advise.

## 2012-08-12 ENCOUNTER — Other Ambulatory Visit: Payer: Self-pay | Admitting: Family

## 2012-08-12 NOTE — Telephone Encounter (Signed)
Tramadol request [Last Rx 03.19.14 #60x0]/SLS Please advise.

## 2012-08-15 ENCOUNTER — Other Ambulatory Visit: Payer: Self-pay | Admitting: *Deleted

## 2012-08-15 DIAGNOSIS — M545 Low back pain: Secondary | ICD-10-CM

## 2012-08-15 MED ORDER — MELOXICAM 7.5 MG PO TABS: 7.5000 mg | ORAL_TABLET | Freq: Every day | ORAL | Status: DC

## 2012-08-31 ENCOUNTER — Other Ambulatory Visit: Payer: Self-pay | Admitting: Family

## 2012-08-31 NOTE — Telephone Encounter (Signed)
Please advise refill request for tramadol?

## 2012-09-09 ENCOUNTER — Ambulatory Visit (INDEPENDENT_AMBULATORY_CARE_PROVIDER_SITE_OTHER): Payer: Medicare Other | Admitting: Family

## 2012-09-09 ENCOUNTER — Encounter: Payer: Self-pay | Admitting: Family

## 2012-09-09 VITALS — BP 104/60 | HR 79 | Temp 98.9°F | Resp 16 | Ht 62.25 in | Wt 104.0 lb

## 2012-09-09 DIAGNOSIS — R232 Flushing: Secondary | ICD-10-CM

## 2012-09-09 DIAGNOSIS — N951 Menopausal and female climacteric states: Secondary | ICD-10-CM

## 2012-09-09 DIAGNOSIS — I10 Essential (primary) hypertension: Secondary | ICD-10-CM

## 2012-09-09 NOTE — Assessment & Plan Note (Signed)
Declines further work up.  Monitor.

## 2012-09-09 NOTE — Patient Instructions (Addendum)
Please cut lisinopril in half and take 1/2 tablet by mouth once daily. Follow up in 1 month for blood pressure check.

## 2012-09-09 NOTE — Progress Notes (Signed)
Subjective:    Patient ID: MEYER DOCKERY, female    DOB: 1931/12/21, 79 y.o.   MRN: [id number]  HPI  Ms Brabant is a 79 yr old female who presents today for follow up.  1) HTN-  BP Readings from Last 3 Encounters:  09/09/12 104/60  06/10/12 100/70  03/11/12 162/78   2) Hot flashes- notes occasional hot flashes. Declines TSH  3) wrist bruise- woke up with bruising right dorsal wrist.  Not sure how she did it. Reports that she does not have associated pain. Denies injury from other.    Review of Systems See HPI  Past Medical History  Diagnosis Date  . Hypertension   . Thyroid disease     hyperthyroidism    History   Social History  . Marital Status: Divorced    Spouse Name: N/A    Number of Children: 2  . Years of Education: N/A   Occupational History  . retired    Social History Main Topics  . Smoking status: Current Every Day Smoker -- 1.50 packs/day  . Smokeless tobacco: Never Used  . Alcohol Use: No  . Drug Use: Not on file  . Sexually Active: Not on file   Other Topics Concern  . Not on file   Social History Narrative   Moved from [place], [place] 2009    Past Surgical History  Procedure Laterality Date  . Abdominal hysterectomy  1965  . Hemorroidectomy  1957  . Breast lumpectomy  1966, 1971    Family History  Problem Relation Age of Onset  . Diabetes    . Hyperlipidemia Neg Hx   . Heart attack Neg Hx   . Hypertension Neg Hx   . Sudden death Neg Hx     No Known Allergies  Current Outpatient Prescriptions on File Prior to Visit  Medication Sig Dispense Refill  . acetaminophen (TYLENOL) 500 MG tablet Take 500 mg by mouth 2 (two) times daily as needed.      Marland Kitchen aspirin 81 MG tablet Take 81 mg by mouth daily.        . bisacodyl (BISACODYL) 5 MG EC tablet Take 5 mg by mouth daily as needed.      . Cholecalciferol (VITAMIN D3) 2000 UNITS TABS Take 1 tablet by mouth daily.        Marland Kitchen glucosamine-chondroitin 500-400 MG tablet Take 1  tablet by mouth 2 (two) times daily.      . meloxicam (MOBIC) 7.5 MG tablet Take 1 tablet (7.5 mg total) by mouth daily. With food  30 tablet  1  . mirabegron ER (MYRBETRIQ) 50 MG TB24 Take 50 mg by mouth daily.      . polycarbophil (FIBERCON) 625 MG tablet Take 625 mg by mouth daily.        . simvastatin (ZOCOR) 40 MG tablet TAKE 1/2 TABLET BY MOUTH EVERY MORNING.  45 tablet  1  . traMADol (ULTRAM) 50 MG tablet TAKE 1 TABLET BY MOUTH EVERY 8 HOURS AS NEEDED FOR BACK OR SHOULDER PAIN  60 tablet  0   No current facility-administered medications on file prior to visit.    BP 104/60  Pulse 79  Temp(Src) 98.9 F (37.2 C) (Oral)  Resp 16  Ht 5' 2.25" (1.581 m)  Wt 104 lb (47.174 kg)  BMI 18.87 kg/m2  SpO2 96%       Objective:   Physical Exam  Constitutional: She is oriented to person, place, and time. She appears well-developed and  well-nourished. No distress.  HENT:  Head: Normocephalic and atraumatic.  Cardiovascular: Normal rate and regular rhythm.   No murmur heard. Pulmonary/Chest: Effort normal and breath sounds normal. No respiratory distress. She has no wheezes. She has no rales. She exhibits no tenderness.  Musculoskeletal: She exhibits no edema.  Lymphadenopathy:    She has no cervical adenopathy.  Neurological: She is alert and oriented to person, place, and time.  Psychiatric: She has a normal mood and affect. Her behavior is normal. Judgment and thought content normal.  skin:  + bruising noted right dorsal hand.         Assessment & Plan:

## 2012-09-09 NOTE — Assessment & Plan Note (Signed)
BP still appears low, will cut lisinopril in half and have pt start 10mg  daily. Plan follow up in 1 month for BP check and bmet.

## 2012-09-14 ENCOUNTER — Other Ambulatory Visit: Payer: Self-pay | Admitting: Internal Medicine

## 2012-09-14 ENCOUNTER — Telehealth: Payer: Self-pay | Admitting: *Deleted

## 2012-09-14 ENCOUNTER — Other Ambulatory Visit: Payer: Self-pay | Admitting: Family Medicine

## 2012-09-14 DIAGNOSIS — M545 Low back pain: Secondary | ICD-10-CM

## 2012-09-14 MED ORDER — MELOXICAM 7.5 MG PO TABS: 7.5000 mg | ORAL_TABLET | Freq: Every day | ORAL | Status: DC

## 2012-09-14 NOTE — Telephone Encounter (Signed)
OK to use short term. Rx sent.

## 2012-09-14 NOTE — Telephone Encounter (Signed)
Left message on patient's daughters voice mail letting them know rx was sent in to pharmacy.

## 2012-09-14 NOTE — Telephone Encounter (Signed)
Received message from pt's daughter that pt was getting meloxicam from [facility]. Since pt hasn't seen them in 9 months they will not refill rx. She wants to know if we can refill medication?  Please advise.

## 2012-09-14 NOTE — Telephone Encounter (Signed)
Rx sent in to pharmacy. 

## 2012-09-21 ENCOUNTER — Other Ambulatory Visit: Payer: Self-pay | Admitting: Family Medicine

## 2012-09-21 NOTE — Telephone Encounter (Signed)
Please advise Tramadol refill? Last RX was wrote on 08-31-12 quantity 60 with 0 refills

## 2012-09-23 ENCOUNTER — Other Ambulatory Visit: Payer: Self-pay | Admitting: Internal Medicine

## 2012-09-23 NOTE — Telephone Encounter (Signed)
Rx sent in to pharmacy. 

## 2012-10-06 ENCOUNTER — Other Ambulatory Visit: Payer: Self-pay | Admitting: Family

## 2012-10-06 NOTE — Telephone Encounter (Signed)
Last rx, #20 sent on 09/14/12 and advised short term use. Please advise.

## 2012-10-07 NOTE — Telephone Encounter (Signed)
I would recommend that she try switching to tylenol as this is safer long term.

## 2012-10-07 NOTE — Telephone Encounter (Signed)
Notified pt's daughter and she voices understanding. States pt has been taking tylenol 500mg , 2 tablets three times daily. Per verbal from Provider, ok to take 2 tablets up to three times daily. Pt has follow up next week.

## 2012-10-11 ENCOUNTER — Other Ambulatory Visit: Payer: Self-pay | Admitting: Family Medicine

## 2012-10-11 NOTE — Telephone Encounter (Signed)
Last OV 09-09-12, last filled 09-21-12 #60

## 2012-10-14 ENCOUNTER — Ambulatory Visit (INDEPENDENT_AMBULATORY_CARE_PROVIDER_SITE_OTHER): Payer: Medicare Other | Admitting: Family

## 2012-10-14 ENCOUNTER — Encounter: Payer: Self-pay | Admitting: Family

## 2012-10-14 VITALS — BP 108/58 | HR 54 | Temp 98.8°F | Resp 16 | Ht 62.25 in | Wt 103.0 lb

## 2012-10-14 DIAGNOSIS — N951 Menopausal and female climacteric states: Secondary | ICD-10-CM

## 2012-10-14 DIAGNOSIS — R232 Flushing: Secondary | ICD-10-CM

## 2012-10-14 DIAGNOSIS — R634 Abnormal weight loss: Secondary | ICD-10-CM

## 2012-10-14 DIAGNOSIS — I10 Essential (primary) hypertension: Secondary | ICD-10-CM

## 2012-10-14 NOTE — Assessment & Plan Note (Signed)
BP stable on 1/2 dose of lisinopril. Continue same, check bmet.

## 2012-10-14 NOTE — Patient Instructions (Addendum)
Please complete lab work prior to leaving. Follow up in 1 month for follow up of your weight loss.  Follow up in 3 months.

## 2012-10-14 NOTE — Assessment & Plan Note (Signed)
Need to rule out Hyperthyroid.  We discussed that if weight loss persists and TSH is normal, we need to consider the possibility of underlying malignancy as a cause.  I have asked the daughter to weigh her once a week and to call if she has additional weight loss.

## 2012-10-14 NOTE — Progress Notes (Signed)
Subjective:    Patient ID: Sarah [name], female    DOB: June 05, 1931, 79 y.o.   MRN: [id number]  HPI  Sarah [name] is a 79 yr old female who presents today for follow up of her HTN.  HTN- last visit her lisinopril was cut in half. She denies dizziness. BP Readings from Last 3 Encounters:  10/14/12 108/58  09/09/12 104/60  06/10/12 100/70   Abdominal bloating- reports gurgling .  Denies pain. Reports normal   Weight loss- pt has lost 9 pounds since 11/13.    Hot flashes- requesting blood test for estradiol    Review of Systems See HPI  Past Medical History  Diagnosis Date  . Hypertension   . Thyroid disease     hyperthyroidism    History   Social History  . Marital Status: Divorced    Spouse Name: N/A    Number of Children: 2  . Years of Education: N/A   Occupational History  . retired    Social History Main Topics  . Smoking status: Current Every Day Smoker -- 1.50 packs/day  . Smokeless tobacco: Never Used  . Alcohol Use: No  . Drug Use: Not on file  . Sexually Active: Not on file   Other Topics Concern  . Not on file   Social History Narrative   Moved from [place], [place] 2009    Past Surgical History  Procedure Laterality Date  . Abdominal hysterectomy  1965  . Hemorroidectomy  1957  . Breast lumpectomy  1966, 1971    Family History  Problem Relation Age of Onset  . Diabetes    . Hyperlipidemia Neg Hx   . Heart attack Neg Hx   . Hypertension Neg Hx   . Sudden death Neg Hx     No Known Allergies  Current Outpatient Prescriptions on File Prior to Visit  Medication Sig Dispense Refill  . acetaminophen (TYLENOL) 500 MG tablet Take 500 mg by mouth 2 (two) times daily as needed.      Marland Kitchen aspirin 81 MG tablet Take 81 mg by mouth daily.        . bisacodyl (BISACODYL) 5 MG EC tablet Take 5 mg by mouth daily as needed.      . Cholecalciferol (VITAMIN D3) 2000 UNITS TABS Take 1 tablet by mouth daily.        Marland Kitchen glucosamine-chondroitin  500-400 MG tablet Take 1 tablet by mouth 2 (two) times daily.      Marland Kitchen lisinopril (PRINIVIL,ZESTRIL) 20 MG tablet Take 10 mg by mouth daily.      . mirabegron ER (MYRBETRIQ) 50 MG TB24 Take 50 mg by mouth daily.      . polycarbophil (FIBERCON) 625 MG tablet Take 625 mg by mouth daily.        . simvastatin (ZOCOR) 40 MG tablet Take 1/2 tablet by mouth every morming  45 tablet  1  . traMADol (ULTRAM) 50 MG tablet TAKE 1 TABLET BY MOUTH EVERY 8 HOURS AS NEEDED FOR BACK OR SHOULDER PAIN  90 tablet  0   No current facility-administered medications on file prior to visit.    BP 108/58  Pulse 54  Temp(Src) 98.8 F (37.1 C) (Oral)  Resp 16  Ht 5' 2.25" (1.581 m)  Wt 103 lb (46.72 kg)  BMI 18.69 kg/m2  SpO2 95%       Objective:   Physical Exam  Gen: Frail elderly white female, NAD CV: S1/S2, RRR, no murmur Resp: BS CTA bilaterally  no wheezes, rales or rhonchi. Abd: soft/NT/ND + BS Ext: A and O x 3.       Assessment & Plan:

## 2012-10-14 NOTE — Assessment & Plan Note (Signed)
No clinical value in checking estradiol at her age- it will be low and she is not a candidate for HRT due to smoking and age.  Will obtain TSH.

## 2012-10-15 LAB — TSH: TSH: 0.732 u[IU]/mL (ref 0.350–4.500)

## 2012-10-15 LAB — BASIC METABOLIC PANEL
Calcium: 9.1 mg/dL (ref 8.4–10.5)
Creat: 0.88 mg/dL (ref 0.50–1.10)
Sodium: 141 mEq/L (ref 135–145)

## 2012-10-16 ENCOUNTER — Telehealth: Payer: Self-pay | Admitting: Family

## 2012-10-16 MED ORDER — AMLODIPINE BESYLATE 5 MG PO TABS: 5.0000 mg | ORAL_TABLET | Freq: Every day | ORAL | Status: DC

## 2012-10-16 NOTE — Telephone Encounter (Signed)
Please call pt and let her know that her potassium is mildly elevated.  This is likely due to lisinopril.  I would like her to stop lisinopril and instead start amlodipine 5mg  once daily. Follow up in 2 weeks so we can repeat her BP and potassium.

## 2012-10-17 NOTE — Telephone Encounter (Signed)
.  left message to have patient return my call.  

## 2012-10-18 NOTE — Telephone Encounter (Signed)
Called pt spoke with daughter Darl Pikes) she stated she had return call this am spoke with Myriam Jacobson and she gave her md response...Raechel Chute

## 2012-10-28 ENCOUNTER — Ambulatory Visit (INDEPENDENT_AMBULATORY_CARE_PROVIDER_SITE_OTHER): Payer: Medicare Other | Admitting: Family

## 2012-10-28 ENCOUNTER — Encounter: Payer: Self-pay | Admitting: Family

## 2012-10-28 VITALS — BP 102/62 | HR 108 | Temp 98.0°F | Resp 16 | Ht 62.25 in | Wt 104.0 lb

## 2012-10-28 DIAGNOSIS — R634 Abnormal weight loss: Secondary | ICD-10-CM

## 2012-10-28 DIAGNOSIS — E875 Hyperkalemia: Secondary | ICD-10-CM

## 2012-10-28 DIAGNOSIS — I10 Essential (primary) hypertension: Secondary | ICD-10-CM

## 2012-10-28 LAB — BASIC METABOLIC PANEL
CO2: 26 mEq/L (ref 19–32)
Glucose, Bld: 80 mg/dL (ref 70–99)
Potassium: 4.8 mEq/L (ref 3.5–5.3)
Sodium: 138 mEq/L (ref 135–145)

## 2012-10-28 NOTE — Progress Notes (Signed)
Subjective:    Patient ID: Sarah [name], female    DOB: January 10, 1932, 79 y.o.   MRN: [id number]  HPI  Sarah [name] is a 79 yr old female who presents today for follow up.  Last visit her bmet revealed mild hyperkalemia and her lisinopril was discontinued and she was placed on amlodipine. Pt is tolerating amlodipine without any adverse side effects.  Review of Systems See HPI  She does continue to have hot flashes (TSH normal last visit)  Past Medical History  Diagnosis Date  . Hypertension   . Thyroid disease     hyperthyroidism    History   Social History  . Marital Status: Divorced    Spouse Name: N/A    Number of Children: 2  . Years of Education: N/A   Occupational History  . retired    Social History Main Topics  . Smoking status: Current Every Day Smoker -- 1.50 packs/day  . Smokeless tobacco: Never Used  . Alcohol Use: No  . Drug Use: Not on file  . Sexually Active: Not on file   Other Topics Concern  . Not on file   Social History Narrative   Moved from [place], [place] 2009    Past Surgical History  Procedure Laterality Date  . Abdominal hysterectomy  1965  . Hemorroidectomy  1957  . Breast lumpectomy  1966, 1971    Family History  Problem Relation Age of Onset  . Diabetes    . Hyperlipidemia Neg Hx   . Heart attack Neg Hx   . Hypertension Neg Hx   . Sudden death Neg Hx     No Known Allergies  Current Outpatient Prescriptions on File Prior to Visit  Medication Sig Dispense Refill  . acetaminophen (TYLENOL) 500 MG tablet Take 500 mg by mouth 2 (two) times daily as needed.      Marland Kitchen amLODipine (NORVASC) 5 MG tablet Take 1 tablet (5 mg total) by mouth daily.  30 tablet  1  . aspirin 81 MG tablet Take 81 mg by mouth daily.        . bisacodyl (BISACODYL) 5 MG EC tablet Take 5 mg by mouth daily as needed.      . Cholecalciferol (VITAMIN D3) 2000 UNITS TABS Take 1 tablet by mouth daily.        Marland Kitchen glucosamine-chondroitin 500-400 MG tablet  Take 1 tablet by mouth 2 (two) times daily.      . mirabegron ER (MYRBETRIQ) 50 MG TB24 Take 50 mg by mouth daily.      . polycarbophil (FIBERCON) 625 MG tablet Take 625 mg by mouth daily.        . simvastatin (ZOCOR) 40 MG tablet Take 1/2 tablet by mouth every morming  45 tablet  1  . traMADol (ULTRAM) 50 MG tablet TAKE 1 TABLET BY MOUTH EVERY 8 HOURS AS NEEDED FOR BACK OR SHOULDER PAIN  90 tablet  0   No current facility-administered medications on file prior to visit.    BP 102/62  Pulse 108  Temp(Src) 98 F (36.7 C) (Oral)  Resp 16  Ht 5' 2.25" (1.581 m)  Wt 104 lb (47.174 kg)  BMI 18.87 kg/m2  SpO2 99%       Objective:   Physical Exam  Constitutional: She is oriented to person, place, and time. She appears well-developed and well-nourished. No distress.  Cardiovascular: Normal rate and regular rhythm.   No murmur heard. Pulmonary/Chest: Effort normal and breath sounds normal. No respiratory  distress. She has no wheezes. She has no rales. She exhibits no tenderness.  Neurological: She is alert and oriented to person, place, and time.  Psychiatric: She has a normal mood and affect. Her behavior is normal. Judgment and thought content normal.          Assessment & Plan:

## 2012-10-28 NOTE — Assessment & Plan Note (Signed)
Repeat BMET today.  

## 2012-10-28 NOTE — Patient Instructions (Addendum)
Please complete lab work prior to leaving. Weigh weekly. Call if you have any weight loss. Follow up in 3 months.

## 2012-10-28 NOTE — Assessment & Plan Note (Signed)
She is up one pound today.  I have asked her daughter to continue to weigh her weekly and contact us if she begins losing again.

## 2012-10-28 NOTE — Assessment & Plan Note (Signed)
BP stable on amlodipine.  

## 2012-10-31 ENCOUNTER — Other Ambulatory Visit: Payer: Self-pay | Admitting: Family

## 2012-10-31 ENCOUNTER — Encounter: Payer: Self-pay | Admitting: Family

## 2012-10-31 NOTE — Telephone Encounter (Signed)
Request for refill on: Tramadol Last Rx: 06.10.14, #90x0 Last OV: 06.27.14 Return: 3-Months Please advise on refills/SLS

## 2012-10-31 NOTE — Telephone Encounter (Signed)
OK to refill on 7/9 for 90 tabs.

## 2012-11-01 NOTE — Telephone Encounter (Signed)
Rx request to pharmacy;*FILL ON OR AFTER November 09, 2012*/SLS

## 2012-11-18 ENCOUNTER — Ambulatory Visit: Payer: Medicare Other | Admitting: Family

## 2012-11-25 ENCOUNTER — Telehealth: Payer: Self-pay | Admitting: Family

## 2012-11-25 DIAGNOSIS — E785 Hyperlipidemia, unspecified: Secondary | ICD-10-CM

## 2012-11-25 MED ORDER — ATORVASTATIN CALCIUM 20 MG PO TABS: 20.0000 mg | ORAL_TABLET | Freq: Every day | ORAL | Status: DC

## 2012-11-25 NOTE — Telephone Encounter (Signed)
Pls call pt and let her know that I received note from her insurance re: possible drug interaction between simvastatin and amlodipine.  I would like her to stop simvastatin and instead start atorvastatin.  Plan FLP/LFT in 3 months dx hyperlipidemia.

## 2012-11-25 NOTE — Telephone Encounter (Signed)
Left message for pt's daughter, Susan to return my call. 

## 2012-11-28 NOTE — Telephone Encounter (Signed)
Notified pt's daughter, future lab order entered.

## 2012-12-26 ENCOUNTER — Other Ambulatory Visit: Payer: Self-pay | Admitting: Family

## 2012-12-26 NOTE — Telephone Encounter (Signed)
Faxed refill request received from pharmacy for Tramadol Last filled by MD on 06.30.14, #90x0 Last AEX - 06.30.14 Next AEX - 3 Months Please Advise/SLS

## 2012-12-27 NOTE — Telephone Encounter (Signed)
Left message on daughter's wk voicemail that Rx is now classified as a controlled substance and I will fax it to pharmacy and let us know if she has any problems. Rx faxed.

## 2012-12-27 NOTE — Telephone Encounter (Signed)
See rx, cannot be filled prior to 8/28.

## 2013-01-27 ENCOUNTER — Ambulatory Visit: Payer: Medicare Other | Admitting: Family

## 2013-01-31 ENCOUNTER — Encounter: Payer: Self-pay | Admitting: Family

## 2013-01-31 ENCOUNTER — Ambulatory Visit (INDEPENDENT_AMBULATORY_CARE_PROVIDER_SITE_OTHER): Payer: Medicare Other | Admitting: Family

## 2013-01-31 VITALS — BP 104/62 | HR 76 | Temp 98.8°F | Resp 18 | Ht 62.25 in | Wt 102.0 lb

## 2013-01-31 DIAGNOSIS — Z23 Encounter for immunization: Secondary | ICD-10-CM

## 2013-01-31 DIAGNOSIS — H00019 Hordeolum externum unspecified eye, unspecified eyelid: Secondary | ICD-10-CM

## 2013-01-31 DIAGNOSIS — R0989 Other specified symptoms and signs involving the circulatory and respiratory systems: Secondary | ICD-10-CM

## 2013-01-31 DIAGNOSIS — I10 Essential (primary) hypertension: Secondary | ICD-10-CM

## 2013-01-31 DIAGNOSIS — H00016 Hordeolum externum left eye, unspecified eyelid: Secondary | ICD-10-CM

## 2013-01-31 MED ORDER — AMLODIPINE BESYLATE 5 MG PO TABS: 5.0000 mg | ORAL_TABLET | Freq: Every day | ORAL | Status: DC

## 2013-01-31 NOTE — Assessment & Plan Note (Addendum)
1+ pulses to DP and PT bilaterally. Patient reporting intermittent "cold" sensation to bilateral lower extremities.  Ankle brachial index ordered.

## 2013-01-31 NOTE — Patient Instructions (Addendum)
Stop lisinopril. Restart amlodipine. Follow up in 3 months- come fasting to the appointment.  You will be contacted about your referral for the leg test.

## 2013-01-31 NOTE — Assessment & Plan Note (Signed)
Patient taking lisinopril rather than amlodipine because she is out of CCB.  Instructed patient to discontinue lisinopril due to hyperkalemia.  Refills sent for amlodipine, patient to restart.  Follow up in 3 months.

## 2013-01-31 NOTE — Progress Notes (Signed)
Subjective:    Patient ID: Sarah [name], female    DOB: 07/28/1931, 79 y.o.   MRN: [id number]  HPI Sarah [name] is a 79 year old female who presents today with for follow up of multiple problems.  HTN)  Blood pressure 104/62 in office today, has started taking Lisinopril because she ran out of amlodopine, denies chest pain, shortness of breath.  Cold Extremity)  Patient reports intermittent bilateral cold sensation to lower extremities x 1 week.  Patient has a sedentary lifestyle (watches TV and smokes during the day) and tries to walk short distances daily.  Denies pain (at rest and walking), warmth, swelling to bilateral lower extremities.  Stye)  Patient presents with stye to left  lower medial lid x 2 weeks. Patient has history of and has been applying warm compresses.  Denies change in vision, pain, drainage.    Review of Systems  Constitutional: Negative for activity change.  Eyes: Negative for pain, redness and itching.  Respiratory: Negative for chest tightness and shortness of breath.   Cardiovascular: Negative for chest pain and leg swelling.       Denies swelling, reports intermittent cold sensation to bilateral lower extremities x 1 week.  Skin: Negative for color change.   Past Medical History  Diagnosis Date  . Hypertension   . Thyroid disease     hyperthyroidism    History   Social History  . Marital Status: Divorced    Spouse Name: N/A    Number of Children: 2  . Years of Education: N/A   Occupational History  . retired    Social History Main Topics  . Smoking status: Current Every Day Smoker -- 1.50 packs/day  . Smokeless tobacco: Never Used  . Alcohol Use: No  . Drug Use: Not on file  . Sexual Activity: Not on file   Other Topics Concern  . Not on file   Social History Narrative   Moved from [place], [place] 2009    Past Surgical History  Procedure Laterality Date  . Abdominal hysterectomy  1965  . Hemorroidectomy  1957  .  Breast lumpectomy  1966, 1971    Family History  Problem Relation Age of Onset  . Diabetes    . Hyperlipidemia Neg Hx   . Heart attack Neg Hx   . Hypertension Neg Hx   . Sudden death Neg Hx     No Known Allergies  Current Outpatient Prescriptions on File Prior to Visit  Medication Sig Dispense Refill  . acetaminophen (TYLENOL) 500 MG tablet Take 500 mg by mouth 2 (two) times daily as needed.      Marland Kitchen aspirin 81 MG tablet Take 81 mg by mouth daily.        Marland Kitchen atorvastatin (LIPITOR) 20 MG tablet Take 1 tablet (20 mg total) by mouth daily.  30 tablet  2  . bisacodyl (BISACODYL) 5 MG EC tablet Take 5 mg by mouth daily as needed.      . Cholecalciferol (VITAMIN D3) 2000 UNITS TABS Take 1 tablet by mouth daily.        Marland Kitchen glucosamine-chondroitin 500-400 MG tablet Take 1 tablet by mouth 2 (two) times daily.      . mirabegron ER (MYRBETRIQ) 50 MG TB24 Take 50 mg by mouth daily.      . polycarbophil (FIBERCON) 625 MG tablet Take 625 mg by mouth daily.        . traMADol (ULTRAM) 50 MG tablet TAKE 1 TABLET BY MOUTH EVERY  8 HOURS AS NEEDED FOR BACK OR SHOULDER PAIN  90 tablet  0   No current facility-administered medications on file prior to visit.    BP 104/62  Pulse 76  Temp(Src) 98.8 F (37.1 C) (Oral)  Resp 18  Ht 5' 2.25" (1.581 m)  Wt 102 lb (46.267 kg)  BMI 18.51 kg/m2  SpO2 97%       Objective:   Physical Exam  Vitals reviewed. Constitutional: She is oriented to person, place, and time.  Eyes: Pupils are equal, round, and reactive to light. Right eye exhibits no discharge. Left eye exhibits no discharge.    Mild erythema/swelling left lower lid  Cardiovascular: Normal heart sounds and intact distal pulses.   1+ PT and DP pulses bilaterally. 2+ radial bilaterally.  Pulmonary/Chest: She has no wheezes. She has no rales.  Lung sounds diminished throughout.  Musculoskeletal: She exhibits no edema and no tenderness.  Neurological: She is alert and oriented to person, place,  and time.  Skin: Skin is warm and dry. No erythema.  Psychiatric: Her behavior is normal.          Assessment & Plan:  I have personally seen and examined this patient and agree with Jerelyn Charles NP students assessment and plan.

## 2013-02-14 ENCOUNTER — Encounter (HOSPITAL_COMMUNITY): Payer: Medicare Other | Admitting: Cardiology

## 2013-02-22 ENCOUNTER — Ambulatory Visit (HOSPITAL_COMMUNITY): Payer: Medicare Other | Attending: Family

## 2013-02-22 DIAGNOSIS — I10 Essential (primary) hypertension: Secondary | ICD-10-CM | POA: Insufficient documentation

## 2013-02-22 DIAGNOSIS — R0989 Other specified symptoms and signs involving the circulatory and respiratory systems: Secondary | ICD-10-CM

## 2013-02-22 DIAGNOSIS — I739 Peripheral vascular disease, unspecified: Secondary | ICD-10-CM

## 2013-02-22 DIAGNOSIS — R209 Unspecified disturbances of skin sensation: Secondary | ICD-10-CM | POA: Insufficient documentation

## 2013-02-22 DIAGNOSIS — E785 Hyperlipidemia, unspecified: Secondary | ICD-10-CM | POA: Insufficient documentation

## 2013-02-22 DIAGNOSIS — F17200 Nicotine dependence, unspecified, uncomplicated: Secondary | ICD-10-CM | POA: Insufficient documentation

## 2013-02-24 ENCOUNTER — Encounter: Payer: Self-pay | Admitting: Family

## 2013-02-24 NOTE — Progress Notes (Signed)
This encounter was created in error - please disregard.

## 2013-03-01 ENCOUNTER — Other Ambulatory Visit: Payer: Self-pay | Admitting: Family

## 2013-03-01 NOTE — Telephone Encounter (Signed)
Rx request to pharmacy/[facility]  

## 2013-05-09 ENCOUNTER — Ambulatory Visit (INDEPENDENT_AMBULATORY_CARE_PROVIDER_SITE_OTHER): Payer: Medicare Other | Admitting: Family

## 2013-05-09 ENCOUNTER — Encounter: Payer: Self-pay | Admitting: Family

## 2013-05-09 VITALS — BP 112/74 | HR 87 | Temp 98.0°F | Resp 16 | Wt 107.0 lb

## 2013-05-09 DIAGNOSIS — E785 Hyperlipidemia, unspecified: Secondary | ICD-10-CM

## 2013-05-09 DIAGNOSIS — N318 Other neuromuscular dysfunction of bladder: Secondary | ICD-10-CM

## 2013-05-09 DIAGNOSIS — R0989 Other specified symptoms and signs involving the circulatory and respiratory systems: Secondary | ICD-10-CM

## 2013-05-09 DIAGNOSIS — M25519 Pain in unspecified shoulder: Secondary | ICD-10-CM

## 2013-05-09 DIAGNOSIS — M25512 Pain in left shoulder: Secondary | ICD-10-CM

## 2013-05-09 DIAGNOSIS — I10 Essential (primary) hypertension: Secondary | ICD-10-CM

## 2013-05-09 DIAGNOSIS — N3281 Overactive bladder: Secondary | ICD-10-CM

## 2013-05-09 LAB — HEPATIC FUNCTION PANEL
ALT: 18 U/L (ref 0–35)
AST: 28 U/L (ref 0–37)
Albumin: 4 g/dL (ref 3.5–5.2)
Alkaline Phosphatase: 183 U/L — ABNORMAL HIGH (ref 39–117)
BILIRUBIN DIRECT: 0.1 mg/dL (ref 0.0–0.3)
BILIRUBIN INDIRECT: 0.4 mg/dL (ref 0.0–0.9)
TOTAL PROTEIN: 7.4 g/dL (ref 6.0–8.3)
Total Bilirubin: 0.5 mg/dL (ref 0.3–1.2)

## 2013-05-09 LAB — BASIC METABOLIC PANEL
BUN: 16 mg/dL (ref 6–23)
CHLORIDE: 106 meq/L (ref 96–112)
CO2: 27 mEq/L (ref 19–32)
Calcium: 9.5 mg/dL (ref 8.4–10.5)
Creat: 0.63 mg/dL (ref 0.50–1.10)
Glucose, Bld: 111 mg/dL — ABNORMAL HIGH (ref 70–99)
POTASSIUM: 5.2 meq/L (ref 3.5–5.3)
SODIUM: 141 meq/L (ref 135–145)

## 2013-05-09 LAB — LIPID PANEL
Cholesterol: 202 mg/dL — ABNORMAL HIGH (ref 0–200)
HDL: 74 mg/dL (ref >39)
LDL Cholesterol: 109 mg/dL — ABNORMAL HIGH (ref 0–99)
Total CHOL/HDL Ratio: 2.7 Ratio
Triglycerides: 94 mg/dL (ref <150)
VLDL: 19 mg/dL (ref 0–40)

## 2013-05-09 MED ORDER — TRAMADOL HCL 50 MG PO TABS: ORAL_TABLET | ORAL | Status: DC

## 2013-05-09 NOTE — Assessment & Plan Note (Signed)
Advised pt to use tylenol if able, tramadol for severe pain.

## 2013-05-09 NOTE — Assessment & Plan Note (Signed)
ABI normal.

## 2013-05-09 NOTE — Progress Notes (Signed)
Subjective:    Patient ID: Sarah [name], female    DOB: 08/15/1931, 80 y.o.   MRN: [id number]  HPI  [name] is a 80 yr old female who presents today for follow up.  1)HTN- last visit she was taking lisinopril rather than amlodipine because she had run out. Instead, she was started on amlodipine due to hyperkalemia.   BP Readings from Last 3 Encounters:  05/09/13 112/74  01/31/13 104/62  10/28/12 102/62   2) Decreased pulses- last visit pt was noted to have diminished LE pulses. An ABI was ordered and was normal.   3) Overactive bladder- pt notes that she has not taken mybetriq in 6 months due to cost.  Reports that symptoms are stable.   4) L shoulder pain- she is requesting written rx for tramadol.    Review of Systems See HPI  Past Medical History  Diagnosis Date  . Hypertension   . Thyroid disease     hyperthyroidism    History   Social History  . Marital Status: Divorced    Spouse Name: N/A    Number of Children: 2  . Years of Education: N/A   Occupational History  . retired    Social History Main Topics  . Smoking status: Current Every Day Smoker -- 1.50 packs/day  . Smokeless tobacco: Never Used  . Alcohol Use: No  . Drug Use: Not on file  . Sexual Activity: Not on file   Other Topics Concern  . Not on file   Social History Narrative   Moved from [place], [place] 2009    Past Surgical History  Procedure Laterality Date  . Abdominal hysterectomy  1965  . Hemorroidectomy  1957  . Breast lumpectomy  1966, 1971    Family History  Problem Relation Age of Onset  . Diabetes    . Hyperlipidemia Neg Hx   . Heart attack Neg Hx   . Hypertension Neg Hx   . Sudden death Neg Hx     No Known Allergies  Current Outpatient Prescriptions on File Prior to Visit  Medication Sig Dispense Refill  . acetaminophen (TYLENOL) 500 MG tablet Take 500 mg by mouth 2 (two) times daily as needed.      Marland Kitchen amLODipine (NORVASC) 5 MG tablet Take 1  tablet (5 mg total) by mouth daily.  30 tablet  3  . aspirin 81 MG tablet Take 81 mg by mouth daily.        Marland Kitchen atorvastatin (LIPITOR) 20 MG tablet TAKE 1 TABLET (20 MG TOTAL) BY MOUTH DAILY.  30 tablet  2  . bisacodyl (BISACODYL) 5 MG EC tablet Take 5 mg by mouth daily as needed.      . Cholecalciferol (VITAMIN D3) 2000 UNITS TABS Take 1 tablet by mouth daily.        Marland Kitchen glucosamine-chondroitin 500-400 MG tablet Take 1 tablet by mouth 2 (two) times daily.      . mirabegron ER (MYRBETRIQ) 50 MG TB24 Take 50 mg by mouth daily.      . polycarbophil (FIBERCON) 625 MG tablet Take 625 mg by mouth daily.        . traMADol (ULTRAM) 50 MG tablet TAKE 1 TABLET BY MOUTH EVERY 8 HOURS AS NEEDED FOR BACK OR SHOULDER PAIN  90 tablet  0   No current facility-administered medications on file prior to visit.    BP 112/74  Pulse 87  Temp(Src) 98 F (36.7 C) (Oral)  Resp 16  Wt 107 lb (48.535 kg)  SpO2 96%       Objective:   Physical Exam  Constitutional: She is oriented to person, place, and time. She appears well-developed and well-nourished. No distress.  Cardiovascular: Normal rate and regular rhythm.   No murmur heard. Pulmonary/Chest: Effort normal.  Mild rhonchi noted on exam, slightly diminished breath sounds at bases  Neurological: She is alert and oriented to person, place, and time.  Skin: Skin is warm and dry.  Psychiatric: She has a normal mood and affect. Her behavior is normal. Judgment and thought content normal.          Assessment & Plan:

## 2013-05-09 NOTE — Patient Instructions (Signed)
Please complete your lab work prior to leaving. Follow up in 6 months.

## 2013-05-09 NOTE — Assessment & Plan Note (Signed)
Stable off meds.  Monitor.  

## 2013-05-09 NOTE — Assessment & Plan Note (Signed)
BP stable on amlodipine, continue same, obtain bmet.  

## 2013-05-09 NOTE — Progress Notes (Signed)
Pre visit review using our clinic review tool, if applicable. No additional management support is needed unless otherwise documented below in the visit note. 

## 2013-05-09 NOTE — Assessment & Plan Note (Signed)
Check FLP/LFT. Continue statin.

## 2013-05-10 ENCOUNTER — Telehealth: Payer: Self-pay | Admitting: Family

## 2013-05-10 DIAGNOSIS — R748 Abnormal levels of other serum enzymes: Secondary | ICD-10-CM

## 2013-05-10 DIAGNOSIS — R7309 Other abnormal glucose: Secondary | ICD-10-CM

## 2013-05-10 NOTE — Telephone Encounter (Signed)
Relevant patient education assigned to patient using Emmi. ° °

## 2013-05-10 NOTE — Telephone Encounter (Signed)
Please call pt and let her know that Alk Phos is elevated- one of the tests in the liver panel.  I would like her to return for some additional lab work please. See pended lab. Also, sugar mildly elevated. Please see if lab can add on A1C- dx hyperglycemia. Cholesterol just slightly above goal. Continue lipitor.

## 2013-05-12 NOTE — Telephone Encounter (Signed)
Notified pt's daughter. She will bring pt in for blood draw within 1 week.  Unable to add hgb a1c to previous labs, will add to future lab order.

## 2013-05-16 LAB — GAMMA GT: GGT: 193 U/L — ABNORMAL HIGH (ref 7–51)

## 2013-05-16 LAB — HEMOGLOBIN A1C
HEMOGLOBIN A1C: 6 % — AB (ref <5.7)
Mean Plasma Glucose: 126 mg/dL — ABNORMAL HIGH (ref <117)

## 2013-05-18 ENCOUNTER — Telehealth: Payer: Self-pay | Admitting: Family

## 2013-05-18 ENCOUNTER — Encounter: Payer: Self-pay | Admitting: Family

## 2013-05-18 DIAGNOSIS — R748 Abnormal levels of other serum enzymes: Secondary | ICD-10-CM

## 2013-05-18 DIAGNOSIS — R7303 Prediabetes: Secondary | ICD-10-CM | POA: Insufficient documentation

## 2013-05-18 NOTE — Telephone Encounter (Signed)
See mychart.  

## 2013-05-22 ENCOUNTER — Ambulatory Visit (HOSPITAL_BASED_OUTPATIENT_CLINIC_OR_DEPARTMENT_OTHER): Payer: Medicare Other

## 2013-05-24 ENCOUNTER — Other Ambulatory Visit: Payer: Self-pay | Admitting: Family

## 2013-05-24 NOTE — Telephone Encounter (Signed)
Rx request to pharmacy/[facility]  

## 2013-05-26 ENCOUNTER — Ambulatory Visit (HOSPITAL_BASED_OUTPATIENT_CLINIC_OR_DEPARTMENT_OTHER)
Admission: RE | Admit: 2013-05-26 | Discharge: 2013-05-26 | Disposition: A | Discharge status: Home / Self Care | Payer: Medicare Other | Source: Ambulatory Visit | Attending: Family | Admitting: Family

## 2013-05-26 DIAGNOSIS — Q618 Other cystic kidney diseases: Secondary | ICD-10-CM | POA: Insufficient documentation

## 2013-05-26 DIAGNOSIS — R748 Abnormal levels of other serum enzymes: Secondary | ICD-10-CM | POA: Insufficient documentation

## 2013-05-26 DIAGNOSIS — I7090 Unspecified atherosclerosis: Secondary | ICD-10-CM | POA: Insufficient documentation

## 2013-05-29 ENCOUNTER — Telehealth: Payer: Self-pay | Admitting: Family

## 2013-05-29 DIAGNOSIS — R945 Abnormal results of liver function studies: Principal | ICD-10-CM

## 2013-05-29 DIAGNOSIS — R7989 Other specified abnormal findings of blood chemistry: Secondary | ICD-10-CM

## 2013-05-29 NOTE — Telephone Encounter (Signed)
Notified pt's daughter and she voices understanding. She would prefer a Friday afternoon after 1:30pm. Notation added to referral.

## 2013-05-29 NOTE — Telephone Encounter (Signed)
Please call pt and let her know that I reviewed her ultrasound.  One of the bile ducts is slightly dilated and the radiologist is recommending MRI to further evaluate.  I have pended order.

## 2013-06-10 ENCOUNTER — Telehealth: Payer: Self-pay | Admitting: Family

## 2013-06-10 ENCOUNTER — Ambulatory Visit (HOSPITAL_BASED_OUTPATIENT_CLINIC_OR_DEPARTMENT_OTHER)
Admission: RE | Admit: 2013-06-10 | Discharge: 2013-06-10 | Disposition: A | Discharge status: Home / Self Care | Payer: Medicare Other | Source: Ambulatory Visit | Attending: Family | Admitting: Family

## 2013-06-10 DIAGNOSIS — K838 Other specified diseases of biliary tract: Secondary | ICD-10-CM | POA: Insufficient documentation

## 2013-06-10 DIAGNOSIS — R7989 Other specified abnormal findings of blood chemistry: Secondary | ICD-10-CM | POA: Insufficient documentation

## 2013-06-10 DIAGNOSIS — I700 Atherosclerosis of aorta: Secondary | ICD-10-CM | POA: Insufficient documentation

## 2013-06-10 DIAGNOSIS — R945 Abnormal results of liver function studies: Secondary | ICD-10-CM

## 2013-06-10 DIAGNOSIS — N281 Cyst of kidney, acquired: Secondary | ICD-10-CM | POA: Insufficient documentation

## 2013-06-10 DIAGNOSIS — R748 Abnormal levels of other serum enzymes: Secondary | ICD-10-CM

## 2013-06-10 MED ORDER — GADOBENATE DIMEGLUMINE 529 MG/ML IV SOLN: 10.0000 mL | Freq: Once | INTRAVENOUS | Status: AC | PRN

## 2013-06-10 NOTE — Telephone Encounter (Signed)
MRI looks good overall. There is question of narrowing of the common bile duct.  Given abnormal liver tests, I would like for her to meet with Dr. Ardis Hughs (GI) for further evaluation.

## 2013-06-12 NOTE — Telephone Encounter (Signed)
Notified pt's daughter. She requests appt to be on a Friday 1:30 or after. Referral request updated.

## 2013-06-15 ENCOUNTER — Encounter: Payer: Self-pay | Admitting: Internal Medicine

## 2013-08-11 ENCOUNTER — Ambulatory Visit: Payer: Medicare Other | Admitting: Internal Medicine

## 2013-08-22 ENCOUNTER — Other Ambulatory Visit: Payer: Self-pay | Admitting: Family

## 2013-09-07 ENCOUNTER — Encounter: Payer: Self-pay | Admitting: Internal Medicine

## 2013-09-13 ENCOUNTER — Other Ambulatory Visit (INDEPENDENT_AMBULATORY_CARE_PROVIDER_SITE_OTHER): Payer: Medicare Other

## 2013-09-13 ENCOUNTER — Encounter: Payer: Self-pay | Admitting: Internal Medicine

## 2013-09-13 ENCOUNTER — Ambulatory Visit (INDEPENDENT_AMBULATORY_CARE_PROVIDER_SITE_OTHER): Payer: Medicare Other | Admitting: Internal Medicine

## 2013-09-13 VITALS — BP 90/52 | HR 78 | Ht 62.5 in | Wt 105.0 lb

## 2013-09-13 DIAGNOSIS — R7989 Other specified abnormal findings of blood chemistry: Secondary | ICD-10-CM

## 2013-09-13 DIAGNOSIS — R933 Abnormal findings on diagnostic imaging of other parts of digestive tract: Secondary | ICD-10-CM

## 2013-09-13 DIAGNOSIS — R945 Abnormal results of liver function studies: Principal | ICD-10-CM

## 2013-09-13 DIAGNOSIS — F1011 Alcohol abuse, in remission: Secondary | ICD-10-CM

## 2013-09-13 LAB — HEPATIC FUNCTION PANEL
ALBUMIN: 3.9 g/dL (ref 3.5–5.2)
ALT: 32 U/L (ref 0–35)
AST: 40 U/L — ABNORMAL HIGH (ref 0–37)
Alkaline Phosphatase: 130 U/L — ABNORMAL HIGH (ref 39–117)
BILIRUBIN TOTAL: 0.7 mg/dL (ref 0.2–1.2)
Bilirubin, Direct: 0.1 mg/dL (ref 0.0–0.3)
Total Protein: 7.5 g/dL (ref 6.0–8.3)

## 2013-09-13 NOTE — Patient Instructions (Signed)
Your physician has requested that you go to the basement for the following lab work before leaving today: Hepatic function panel

## 2013-09-13 NOTE — Progress Notes (Signed)
Patient ID: Sarah [name], female   DOB: [birth year], 80 y.o.   MRN: [id number] HPI: Hevin Jeffcoat is a 80 year old female with a past medical history of alcohol abuse, in remission, hypertension, hyperlipidemia, sleep apnea, and tobacco abuse who is seen in consultation at the request of Roxy Cedar, PA-C for evaluation of elevated liver enzymes.  She is here today with her daughter, [name]. The patient denies current complaint. She denies abdominal pain. No jaundice. No itching. Good appetite. Stable weight. No nausea or vomiting. No heartburn. No trouble swallowing. She reports normal bowel habits without diarrhea, constipation, blood in her stool or melena. She does report a history of heavy alcohol use for many years but she stopped drinking altogether in 2010. He's had previous workup in [place], [place] from a GI standpoint. Her last colonoscopy was in 2010. No reported family history of liver disease. She denies a history of pancreatitis.  Past Medical History  Diagnosis Date  . Hypertension   . Thyroid disease     hyperthyroidism  . Hyperlipidemia   . Sleep apnea     Past Surgical History  Procedure Laterality Date  . Abdominal hysterectomy  1965  . Hemorroidectomy  1957  . Breast lumpectomy  1966, 1971    Current Outpatient Prescriptions  Medication Sig Dispense Refill  . acetaminophen (TYLENOL) 500 MG tablet Take 500 mg by mouth 2 (two) times daily as needed.      Marland Kitchen amLODipine (NORVASC) 5 MG tablet TAKE 1 TABLET (5 MG TOTAL) BY MOUTH DAILY.  30 tablet  3  . aspirin 81 MG tablet Take 81 mg by mouth daily.        Marland Kitchen atorvastatin (LIPITOR) 20 MG tablet TAKE 1 TABLET (20 MG TOTAL) BY MOUTH DAILY.  30 tablet  4  . bisacodyl (BISACODYL) 5 MG EC tablet Take 5 mg by mouth daily as needed.      . Cholecalciferol (VITAMIN D3) 2000 UNITS TABS Take 1 tablet by mouth daily.        Marland Kitchen glucosamine-chondroitin 500-400 MG tablet Take 1 tablet by mouth 2 (two) times daily.       . Melatonin 10 MG CAPS Take 2 capsules by mouth at bedtime and may repeat dose one time if needed.      . mirabegron ER (MYRBETRIQ) 50 MG TB24 Take 50 mg by mouth daily.      . polycarbophil (FIBERCON) 625 MG tablet Take 625 mg by mouth daily.        . traMADol (ULTRAM) 50 MG tablet TAKE 1 TABLET BY MOUTH EVERY 8 HOURS AS NEEDED FOR BACK OR SHOULDER PAIN  90 tablet  0   No current facility-administered medications for this visit.    No Known Allergies  Family History  Problem Relation Age of Onset  . Diabetes    . Hyperlipidemia Neg Hx   . Heart attack Neg Hx   . Hypertension Neg Hx   . Sudden death Neg Hx   . Colon cancer      grandson    History  Substance Use Topics  . Smoking status: Current Every Day Smoker -- 1.50 packs/day  . Smokeless tobacco: Never Used  . Alcohol Use: No    ROS: As per history of present illness, otherwise negative  BP 90/52  Pulse 78  Ht 5' 2.5" (1.588 m)  Wt 105 lb (47.628 kg)  BMI 18.89 kg/m2 Constitutional: Thin, elderly-appearing female in no acute distress  HEENT: Normocephalic and atraumatic. Oropharynx  is clear and moist. No oropharyngeal exudate. Conjunctivae are normal.  No scleral icterus. Neck: Neck supple. Trachea midline. Cardiovascular: Normal rate, regular rhythm and intact distal pulses.  Pulmonary/chest: Effort normal and distant breath sounds normal. No wheezing, rales or rhonchi. Abdominal: Soft, nontender, nondistended. Bowel sounds active throughout.  Extremities: no clubbing, cyanosis, or edema Neurological: Alert and oriented to person place and time. Skin: Skin is warm and dry. No rashes noted. Psychiatric: Normal mood and affect. Behavior is normal.  RELEVANT LABS AND IMAGING: CBC    Component Value Date/Time   WBC 10.8* 11/06/2011 0825   RBC 3.98 11/06/2011 0825   HGB 13.5 11/06/2011 0825   HCT 38.0 11/06/2011 0825   PLT 374 11/06/2011 0825   MCV 95.5 11/06/2011 0825   MCH 33.9 11/06/2011 0825   MCHC 35.5 11/06/2011  0825   RDW 13.0 11/06/2011 0825   LYMPHSABS 2.7 11/06/2011 0825   MONOABS 0.9 11/06/2011 0825   EOSABS 0.2 11/06/2011 0825   BASOSABS 0.1 11/06/2011 0825    CMP     Component Value Date/Time   NA 141 05/09/2013 0912   K 5.2 05/09/2013 0912   CL 106 05/09/2013 0912   CO2 27 05/09/2013 0912   GLUCOSE 111* 05/09/2013 0912   BUN 16 05/09/2013 0912   CREATININE 0.63 05/09/2013 0912   CREATININE 0.85 01/03/2010 1832   CALCIUM 9.5 05/09/2013 0912   PROT 7.5 09/13/2013 1041   ALBUMIN 3.9 09/13/2013 1041   AST 40* 09/13/2013 1041   ALT 32 09/13/2013 1041   ALKPHOS 130* 09/13/2013 1041   BILITOT 0.7 09/13/2013 1041   GFRNONAA >60 08/22/2010 1658   GFRAA >60 08/22/2010 1658   Results for SIANNE, TEJADA (MRN [id number]) as of 09/13/2013 15:29  Ref. Range 06/10/2012 14:23 10/14/2012 13:40 10/28/2012 13:38 05/09/2013 09:12 05/16/2013 09:50 09/13/2013 10:41  Alkaline Phosphatase Latest Range: 39-117 U/L 46   183 (H)  130 (H)  Albumin Latest Range: 3.5-5.2 g/dL 4.3   4.0  3.9  AST Latest Range: 0-37 U/L 20   28  40 (H)  ALT Latest Range: 0-35 U/L 18   18  32  Total Protein Latest Range: 6.0-8.3 g/dL 6.6   7.4  7.5  Bilirubin, Direct Latest Range: 0.0-0.3 mg/dL 0.1   0.1  0.1  Indirect Bilirubin Latest Range: 0.0-0.9 mg/dL 0.2   0.4    Total Bilirubin Latest Range: 0.2-1.2 mg/dL 0.3   0.5  0.7    ULTRASOUND ABDOMEN COMPLETE   COMPARISON:  CT abdomen pelvis dated 05/27/2012   FINDINGS: Gallbladder:   Probable sludge in the gallbladder fundus. No gallstones, gallbladder wall thickening, or pericholecystic fluid. Negative sonographic Murphy's sign.   Common bile duct:   Diameter: Measures 9 mm, unchanged from prior CT.   Liver:   No focal lesion identified. Within normal limits in parenchymal echogenicity. Possible mild intrahepatic ductal dilatation (image 19).   IVC:   Poorly visualized.   Pancreas:   Poorly visualized due to overlying bowel gas.   Spleen:   Not visualized due to overlying bowel gas.    Right Kidney:   Length: 10.0 cm.  9 x 7 x 8 mm upper pole cyst.  No hydronephrosis.   Left Kidney:   Length: 8.7 cm. 5.0 x 4.6 x 4.4 cm exophytic lower pole renal cyst. No hydronephrosis.   Abdominal aorta:   No aneurysm visualized.  Atherosclerosis.   Other findings:   None.   IMPRESSION: Possible mild intrahepatic ductal dilatation. Common duct is  mildly prominent, measuring 9 mm, but unchanged from prior CT. In the setting of abnormal LFTs, consider follow-up MRI/MRCP abdomen with/without contrast for further evaluation.   Possible gallbladder sludge. No sonographic findings to suggest acute cholecystitis.   Bilateral renal cysts.  ____________________________________________________________________________________________________ MRI ABDOMEN WITHOUT AND WITH CONTRAST (INCLUDING MRCP)   TECHNIQUE: Multiplanar multisequence MR imaging of the abdomen was performed both before and after the administration of intravenous contrast. Heavily T2-weighted images of the biliary and pancreatic ducts were obtained, and three-dimensional MRCP images were rendered by post processing.   CONTRAST:  10 cc MultiHance   COMPARISON:  US ABDOMEN COMPLETE dated 05/26/2013; CT ABD-PEL WO/W CM dated 05/27/2012   FINDINGS: Exam is moderately motion degraded throughout. Left hepatic lobe cyst. Grossly normal spleen, stomach.   Pancreas divisum, including on image 18/ series 3. No evidence of acute pancreatitis.   Gallbladder adenomyomatosis on image 28/series 3. No evidence of gallstone. No intrahepatic biliary ductal dilatation. The common duct measures 8 mm on image 18/series 7 in the porta hepatis. This is upper normal for patient age. Transition to more diminutive common duct is identified in the region of the pancreatic head. Example image 18/series 7. Given across modality comparison, similar to 05/27/2012 CT. No evidence of obstructive stone or mass. No pancreatic ductal  dilatation.   Normal adrenal glands. Bilateral renal cysts. The largest is in the interpolar left kidney. No abdominal adenopathy or ascites. Advanced aortic atherosclerosis. Spinal curvature and secondary degenerative disc disease.   IMPRESSION: 1. Moderately motion degraded exam throughout. 2. Proximal common duct size upper normal, without evidence of obstructive stone or mass. Transition to more diminutive common duct in the region of the pancreatic head. This is similar to 05/27/2012. Although a benign stricture cannot be excluded, lack of intrahepatic ductal dilatation argues against this. 3. Findings of pancreas divisum. 4. Gallbladder adenomyomatosis. 5. Extensive aortic atherosclerosis.  ASSESSMENT/PLAN: 80 year old female with a past medical history of alcohol abuse, in remission, hypertension, hyperlipidemia, sleep apnea, and tobacco abuse who is seen in consultation at the request of Roxy Cedar, PA-C for evaluation of elevated liver enzymes.  1.  Elevated alkaline phosphatase/biliary ductal dilatation without obstruction/pancreas divisum -- she had an elevated alkaline phosphatase when checked in January. She is asymptomatic with no evidence for biliary obstruction clinically. She is not having abdominal pain. MRI/MRCP was performed which showed extrahepatic biliary ductal dilatation, with normal intrahepatic ducts. There was no pancreatic mass or adenopathy. She does have incidental pancreas divisum, but no history of pancreatitis. Her ductal dilatation was stable and compared to a CT from 2014. The CT report is not available to me at this time. Given her lack of symptoms, I do not feel further workup is necessary at this time. I will recheck liver enzymes today along with antimitochondrial antibody. I recommend repeating the MRI/MRCP in one year and monitoring liver enzymes every 3 months for the next year. If enzymes become more elevated I would recommend repeating the  imaging test sooner. Patient understands and is comfortable with this plan.  2.  CRC screening -- we'll attempt to obtain records from her GI doctor in [facility]. Last colonoscopy 2010  3.  ETOH abuse -- in remission

## 2013-09-14 ENCOUNTER — Other Ambulatory Visit: Payer: Self-pay

## 2013-09-14 DIAGNOSIS — R945 Abnormal results of liver function studies: Principal | ICD-10-CM

## 2013-09-14 DIAGNOSIS — R7989 Other specified abnormal findings of blood chemistry: Secondary | ICD-10-CM

## 2013-09-19 ENCOUNTER — Telehealth: Payer: Self-pay | Admitting: Internal Medicine

## 2013-09-19 NOTE — Telephone Encounter (Signed)
Rec'd from [facility] forward 4 pages to Dr.Pyrtle

## 2013-09-20 ENCOUNTER — Other Ambulatory Visit: Payer: Self-pay | Admitting: Family

## 2013-09-26 ENCOUNTER — Encounter: Payer: Self-pay | Admitting: Internal Medicine

## 2013-09-26 DIAGNOSIS — Z1211 Encounter for screening for malignant neoplasm of colon: Secondary | ICD-10-CM | POA: Insufficient documentation

## 2013-11-07 ENCOUNTER — Encounter: Payer: Self-pay | Admitting: Family

## 2013-11-07 ENCOUNTER — Telehealth: Payer: Self-pay | Admitting: Family

## 2013-11-07 ENCOUNTER — Ambulatory Visit (INDEPENDENT_AMBULATORY_CARE_PROVIDER_SITE_OTHER): Payer: Medicare Other | Admitting: Family

## 2013-11-07 VITALS — BP 122/72 | HR 97 | Temp 98.4°F | Resp 16 | Ht 62.25 in | Wt 105.0 lb

## 2013-11-07 DIAGNOSIS — Z13820 Encounter for screening for osteoporosis: Secondary | ICD-10-CM

## 2013-11-07 DIAGNOSIS — R739 Hyperglycemia, unspecified: Secondary | ICD-10-CM

## 2013-11-07 DIAGNOSIS — G4700 Insomnia, unspecified: Secondary | ICD-10-CM

## 2013-11-07 DIAGNOSIS — R7309 Other abnormal glucose: Secondary | ICD-10-CM

## 2013-11-07 DIAGNOSIS — E785 Hyperlipidemia, unspecified: Secondary | ICD-10-CM

## 2013-11-07 DIAGNOSIS — I10 Essential (primary) hypertension: Secondary | ICD-10-CM

## 2013-11-07 LAB — HEPATIC FUNCTION PANEL
ALK PHOS: 91 U/L (ref 39–117)
ALT: 11 U/L (ref 0–35)
AST: 18 U/L (ref 0–37)
Albumin: 4.1 g/dL (ref 3.5–5.2)
BILIRUBIN INDIRECT: 0.5 mg/dL (ref 0.2–1.2)
Bilirubin, Direct: 0.1 mg/dL (ref 0.0–0.3)
Total Bilirubin: 0.6 mg/dL (ref 0.2–1.2)
Total Protein: 7.1 g/dL (ref 6.0–8.3)

## 2013-11-07 LAB — BASIC METABOLIC PANEL WITH GFR
BUN: 12 mg/dL (ref 6–23)
CHLORIDE: 104 meq/L (ref 96–112)
CO2: 27 mEq/L (ref 19–32)
Calcium: 9.7 mg/dL (ref 8.4–10.5)
Creat: 0.82 mg/dL (ref 0.50–1.10)
GFR, EST NON AFRICAN AMERICAN: 68 mL/min
GFR, Est African American: 79 mL/min
Glucose, Bld: 107 mg/dL — ABNORMAL HIGH (ref 70–99)
POTASSIUM: 4.8 meq/L (ref 3.5–5.3)
Sodium: 141 mEq/L (ref 135–145)

## 2013-11-07 LAB — HEMOGLOBIN A1C
Hgb A1c MFr Bld: 6 % — ABNORMAL HIGH (ref <5.7)
Mean Plasma Glucose: 126 mg/dL — ABNORMAL HIGH (ref <117)

## 2013-11-07 MED ORDER — ALPRAZOLAM 0.5 MG PO TABS: 0.5000 mg | ORAL_TABLET | Freq: Every evening | ORAL | Status: DC | PRN

## 2013-11-07 NOTE — Patient Instructions (Signed)
Please complete lab work prior to leaving. You will be contacted re: your bone density. Start xanax as needed before bed to help you sleep. Follow up in 3 months.

## 2013-11-07 NOTE — Progress Notes (Signed)
Subjective:    Patient ID: Sarah [name], female    DOB: December 23, 1931, 80 y.o.   MRN: [id number]  HPI  [name] is a 80 yr old female who presents today for follow up.  1) HTN- she is maintained on amlodipine.  BP Readings from Last 3 Encounters:  11/07/13 122/72  09/13/13 90/52  05/09/13 112/74   2) Borderline DM-  Lab Results  Component Value Date   HGBA1C 6.0* 05/16/2013   3) Hyperlipidemia-  She is maintained on atorvastatin.   Lab Results  Component Value Date   CHOL 202* 05/09/2013   HDL 74 05/09/2013   LDLCALC 109* 05/09/2013   TRIG 94 05/09/2013   CHOLHDL 2.7 05/09/2013   4) elevated LFT's- she was evaluated by GI.  MR/MRICP noted extrahepatic biliary ductal dilatation but normal hepatic ducts, no mass or adenopathy.  Pt will continue to follow  With GI for repeat MRI/MRCP in 1 years and serial liver enzymes.    5) Insomnia-  Has been taking benadryl 50mg  at bedtime.  Has had issues falling and staying asleep.  Reports that she drinks coffee in the AM.  Decaf soda in the afternoon.  She does not nap during the day.  She has tv on timer in her bedroom.  Watches tv/solitaire/puzzles at bedtime.    Declines tetanus, declines zostavax, reports historical zostavax, unsure of year. Review of Systems    see HPI  Past Medical History  Diagnosis Date  . Hypertension   . Thyroid disease     hyperthyroidism  . Hyperlipidemia   . Sleep apnea     History   Social History  . Marital Status: Divorced    Spouse Name: N/A    Number of Children: 2  . Years of Education: N/A   Occupational History  . retired    Social History Main Topics  . Smoking status: Current Every Day Smoker -- 1.50 packs/day  . Smokeless tobacco: Never Used  . Alcohol Use: No  . Drug Use: Not on file  . Sexual Activity: Not on file   Other Topics Concern  . Not on file   Social History Narrative   Moved from [place], [place] 2009    Past Surgical History  Procedure Laterality  Date  . Abdominal hysterectomy  1965  . Hemorroidectomy  1957  . Breast lumpectomy  1966, 1971    Family History  Problem Relation Age of Onset  . Diabetes    . Hyperlipidemia Neg Hx   . Heart attack Neg Hx   . Hypertension Neg Hx   . Sudden death Neg Hx   . Colon cancer      grandson    No Known Allergies  Current Outpatient Prescriptions on File Prior to Visit  Medication Sig Dispense Refill  . acetaminophen (TYLENOL) 500 MG tablet Take 500 mg by mouth 2 (two) times daily as needed.      Marland Kitchen amLODipine (NORVASC) 5 MG tablet TAKE 1 TABLET (5 MG TOTAL) BY MOUTH DAILY.  30 tablet  3  . aspirin 81 MG tablet Take 81 mg by mouth daily.        Marland Kitchen atorvastatin (LIPITOR) 20 MG tablet TAKE 1 TABLET (20 MG TOTAL) BY MOUTH DAILY.  30 tablet  4  . bisacodyl (BISACODYL) 5 MG EC tablet Take 5 mg by mouth daily as needed.      . Cholecalciferol (VITAMIN D3) 2000 UNITS TABS Take 1 tablet by mouth daily.        Marland Kitchen  glucosamine-chondroitin 500-400 MG tablet Take 1 tablet by mouth 2 (two) times daily.      . mirabegron ER (MYRBETRIQ) 50 MG TB24 Take 50 mg by mouth daily.      . polycarbophil (FIBERCON) 625 MG tablet Take 625 mg by mouth daily.        . traMADol (ULTRAM) 50 MG tablet TAKE 1 TABLET BY MOUTH EVERY 8 HOURS AS NEEDED FOR BACK OR SHOULDER PAIN  90 tablet  0   No current facility-administered medications on file prior to visit.    BP 122/72  Pulse 97  Temp(Src) 98.4 F (36.9 C) (Oral)  Resp 16  Ht 5' 2.25" (1.581 m)  Wt 105 lb (47.628 kg)  BMI 19.05 kg/m2  SpO2 98%    Objective:   Physical Exam  Constitutional: She is oriented to person, place, and time. She appears well-developed and well-nourished. No distress.  HENT:  Head: Normocephalic and atraumatic.  Mouth/Throat: No oropharyngeal exudate.  Eyes: No scleral icterus.  Cardiovascular: Normal rate and regular rhythm.   No murmur heard. Pulmonary/Chest: Effort normal and breath sounds normal. No respiratory distress. She  has no wheezes. She has no rales. She exhibits no tenderness.  Musculoskeletal: She exhibits no edema.  Neurological: She is alert and oriented to person, place, and time.  Psychiatric: She has a normal mood and affect. Her behavior is normal. Judgment and thought content normal.          Assessment & Plan:

## 2013-11-07 NOTE — Telephone Encounter (Signed)
Relevant patient education assigned to patient using Emmi. ° °

## 2013-11-07 NOTE — Assessment & Plan Note (Signed)
BP stable on current meds.  Obtain bmet. 

## 2013-11-07 NOTE — Assessment & Plan Note (Signed)
Continue statin, obtain follow up lft.

## 2013-11-07 NOTE — Progress Notes (Signed)
Pre visit review using our clinic review tool, if applicable. No additional management support is needed unless otherwise documented below in the visit note. 

## 2013-11-07 NOTE — Assessment & Plan Note (Signed)
Trial of low dose xanax.

## 2013-11-14 ENCOUNTER — Ambulatory Visit (INDEPENDENT_AMBULATORY_CARE_PROVIDER_SITE_OTHER)
Admission: RE | Admit: 2013-11-14 | Discharge: 2013-11-14 | Disposition: A | Discharge status: Home / Self Care | Payer: Medicare Other | Source: Ambulatory Visit | Attending: Family | Admitting: Family

## 2013-11-14 DIAGNOSIS — Z13820 Encounter for screening for osteoporosis: Secondary | ICD-10-CM

## 2013-11-15 ENCOUNTER — Telehealth: Payer: Self-pay | Admitting: *Deleted

## 2013-11-15 MED ORDER — ALPRAZOLAM 0.5 MG PO TABS: 0.5000 mg | ORAL_TABLET | Freq: Every evening | ORAL | Status: DC | PRN

## 2013-11-15 NOTE — Telephone Encounter (Signed)
I recommend only one tablet of alprazolam.  OK to give early refill this time, but will not refill early next month if she runs out.

## 2013-11-15 NOTE — Telephone Encounter (Signed)
Notified pt's daughter and called Rx to [facility] at [facility] [employer].

## 2013-11-15 NOTE — Telephone Encounter (Signed)
Received call from pt's daughter stating pt has been taking 2 alprazolam at bedtime instead of 1 as prescribed. Pt told her that 1 was not working. Pt will be out of medication soon. She told her daughter that she had called the pharmacy and they told her it would be ready to pick up today but the pharmacy denies talking with pt. Advised daughter to fix enough medication for the week and put the rest out of pt's reach and she voices understanding. She also notes that on Saturday pt asked her the same question repeatedly about buying her cigarettes. States this is not usual for pt and wonders if that may have been due to the increased dose of alprazolam that she has been taking. Please advise re: alprazolam refill and dose?

## 2013-11-21 ENCOUNTER — Telehealth: Payer: Self-pay | Admitting: Family

## 2013-11-21 MED ORDER — ALENDRONATE SODIUM 70 MG PO TABS: 70.0000 mg | ORAL_TABLET | ORAL | Status: DC

## 2013-11-21 NOTE — Telephone Encounter (Signed)
Bone density shows osteoporosis.  Add Caltrate + D 600mg  bid, add once weekly fosamax. Needs to quit smoking and get regular weight bearing exercise such as walking. Plan to repeat bone density in 2 yrs.

## 2013-11-22 ENCOUNTER — Telehealth: Payer: Self-pay

## 2013-11-22 NOTE — Telephone Encounter (Signed)
Pt was contacted VM was left for Pt's Dtr to return call concerning Mothers Test results

## 2013-11-23 ENCOUNTER — Encounter: Payer: Self-pay | Admitting: Family

## 2013-11-24 NOTE — Telephone Encounter (Signed)
See My chart message from pt. She requests that we contact her daughter with her most recent labs:  Sugar mildly elevated in the borderline diabetes range. Kidney function, liver function is normal.

## 2013-11-24 NOTE — Telephone Encounter (Signed)
Attempted to reach pt's daughter at wk # but they are gone for the day. Will try again later.

## 2013-11-27 ENCOUNTER — Telehealth: Payer: Self-pay | Admitting: *Deleted

## 2013-11-27 NOTE — Telephone Encounter (Signed)
Received call from pt's daughter letting us know that pt has been very difficult with her family over her alprazolam incident. Daughter states that pt is aware that she cannot get Rx filled until 12/04/13 but she is behaving "like an addict that needs her fix". Wanted [place] to be aware that pt has a history of alcohol addiction.  Please advise.

## 2013-11-27 NOTE — Telephone Encounter (Signed)
Notified pt's daughter and she voices understanding. 

## 2013-11-27 NOTE — Telephone Encounter (Signed)
Noted.  No further recs at this time.

## 2013-11-27 NOTE — Telephone Encounter (Signed)
Left message for pt's daughter, [name] to return my call.

## 2013-11-29 ENCOUNTER — Telehealth: Payer: Self-pay | Admitting: *Deleted

## 2013-11-29 NOTE — Telephone Encounter (Signed)
Message copied by Ronny Flurry on Wed Nov 29, 2013  9:47 AM ------      Message from: O'SULLIVAN, MELISSA      Created: Tue Nov 07, 2013  8:26 AM       Declines tetanus, reports historical zostavax, unsure of year. thanks ------

## 2013-11-29 NOTE — Telephone Encounter (Signed)
Health Maintenance updated / postponed.

## 2014-01-09 ENCOUNTER — Other Ambulatory Visit: Payer: Self-pay | Admitting: Family

## 2014-01-09 NOTE — Telephone Encounter (Signed)
Informed patient daughter of med refill and she scheduled appt.

## 2014-01-09 NOTE — Telephone Encounter (Signed)
Atorvastatin and amlodipine refills sent to pharmacy. Pt is due for follow up on October 7 or after. Please call pt's daughter to arrange appointment.

## 2014-01-10 ENCOUNTER — Telehealth: Payer: Self-pay | Admitting: *Deleted

## 2014-01-10 MED ORDER — ALPRAZOLAM 0.5 MG PO TABS: 0.5000 mg | ORAL_TABLET | Freq: Every evening | ORAL | Status: DC | PRN

## 2014-01-10 NOTE — Telephone Encounter (Signed)
Received fax request from [facility] for alprazolam.  Last Rx given 11/15/13, #30. Rx printed and forwarded to Provider for signature.

## 2014-01-10 NOTE — Telephone Encounter (Signed)
Rx was faxed to pharmacy at 3:45pm.

## 2014-02-07 ENCOUNTER — Telehealth: Payer: Self-pay | Admitting: Family

## 2014-02-07 MED ORDER — ALPRAZOLAM 0.5 MG PO TABS: 0.5000 mg | ORAL_TABLET | Freq: Every evening | ORAL | Status: DC | PRN

## 2014-02-07 NOTE — Telephone Encounter (Signed)
Last rx printed 01/10/14, #30.  Rx printed and forwarded to PRovider for signature.

## 2014-02-07 NOTE — Telephone Encounter (Signed)
Caller name: Zea Relation to pt: self Call back number: [phone] Pharmacy: [facility]  Reason for call:   Patient is requesting a new alprazolam rx

## 2014-02-07 NOTE — Telephone Encounter (Signed)
Rx faxed to pharmacy, notified pt's daughter.

## 2014-02-12 ENCOUNTER — Ambulatory Visit (INDEPENDENT_AMBULATORY_CARE_PROVIDER_SITE_OTHER): Payer: Medicare Other | Admitting: Family

## 2014-02-12 ENCOUNTER — Telehealth: Payer: Self-pay | Admitting: *Deleted

## 2014-02-12 ENCOUNTER — Encounter: Payer: Self-pay | Admitting: Family

## 2014-02-12 VITALS — BP 146/81 | HR 77 | Temp 98.8°F | Resp 16 | Ht 62.25 in | Wt 113.0 lb

## 2014-02-12 DIAGNOSIS — R7303 Prediabetes: Secondary | ICD-10-CM

## 2014-02-12 DIAGNOSIS — M810 Age-related osteoporosis without current pathological fracture: Secondary | ICD-10-CM

## 2014-02-12 DIAGNOSIS — G4700 Insomnia, unspecified: Secondary | ICD-10-CM

## 2014-02-12 DIAGNOSIS — R7309 Other abnormal glucose: Secondary | ICD-10-CM

## 2014-02-12 DIAGNOSIS — Z720 Tobacco use: Secondary | ICD-10-CM

## 2014-02-12 DIAGNOSIS — Z23 Encounter for immunization: Secondary | ICD-10-CM

## 2014-02-12 DIAGNOSIS — M8580 Other specified disorders of bone density and structure, unspecified site: Secondary | ICD-10-CM

## 2014-02-12 DIAGNOSIS — R739 Hyperglycemia, unspecified: Secondary | ICD-10-CM

## 2014-02-12 DIAGNOSIS — E785 Hyperlipidemia, unspecified: Secondary | ICD-10-CM

## 2014-02-12 LAB — VITAMIN D 25 HYDROXY (VIT D DEFICIENCY, FRACTURES): VITD: 48.06 ng/mL (ref 30.00–100.00)

## 2014-02-12 LAB — BASIC METABOLIC PANEL
BUN: 14 mg/dL (ref 6–23)
CALCIUM: 10.2 mg/dL (ref 8.4–10.5)
CO2: 26 meq/L (ref 19–32)
Chloride: 105 mEq/L (ref 96–112)
Creatinine, Ser: 0.8 mg/dL (ref 0.4–1.2)
GFR: 75.57 mL/min (ref >60.00)
Glucose, Bld: 114 mg/dL — ABNORMAL HIGH (ref 70–99)
Potassium: 5.1 mEq/L (ref 3.5–5.1)
SODIUM: 142 meq/L (ref 135–145)

## 2014-02-12 LAB — HEMOGLOBIN A1C: Hgb A1c MFr Bld: 5.9 % (ref 4.6–6.5)

## 2014-02-12 NOTE — Addendum Note (Signed)
Addended by: Harl Bowie on: 02/12/2014 09:29 AM   Modules accepted: Orders

## 2014-02-12 NOTE — Assessment & Plan Note (Signed)
On fosamax, will check vit d level.

## 2014-02-12 NOTE — Assessment & Plan Note (Signed)
Lipid stable.

## 2014-02-12 NOTE — Telephone Encounter (Signed)
Received email from pt's daughter. She is concerned about pt's dependence on Alprazolam.  Please advise.   She's addicted to the sleeping pill, any way of changing it?  She admitted on Saturday to relying on them - not good  [name]

## 2014-02-12 NOTE — Assessment & Plan Note (Signed)
Discussed smoking cessation, she is not motivated to quit

## 2014-02-12 NOTE — Assessment & Plan Note (Signed)
Stable with use of PRN xanax.

## 2014-02-12 NOTE — Telephone Encounter (Signed)
Reviewed patient's record.  Her daughter is not listed on her HIPPA.  I will address with the patient at her next visit.

## 2014-02-12 NOTE — Assessment & Plan Note (Signed)
Discussed diabetic diet, obtain follow up A1C.

## 2014-02-12 NOTE — Patient Instructions (Signed)
Please complete lab work prior to leaving. Work on quitting smoking, this is important for your health. Follow up in 3 months.

## 2014-02-12 NOTE — Progress Notes (Signed)
Pre visit review using our clinic review tool, if applicable. No additional management support is needed unless otherwise documented below in the visit note. 

## 2014-02-12 NOTE — Addendum Note (Signed)
Addended by: Harl Bowie on: 02/12/2014 10:25 AM   Modules accepted: Orders

## 2014-02-12 NOTE — Progress Notes (Signed)
Subjective:    Patient ID: Sarah [name], female    DOB: October 09, 1931, 80 y.o.   MRN: [id number]  HPI  Sarah [name] is a 80 yr old female who presents today for follow up.  1) HTN- maintained on amlodipine-  BP Readings from Last 3 Encounters:  02/12/14 146/81  11/07/13 122/72  09/13/13 90/52     2) Hyperlipidemia- on atorvastatin.  Lab Results  Component Value Date   LDLCALC 109* 05/09/2013     3) Hyperglycemia-diet controlled.   Lab Results  Component Value Date   HGBA1C 6.0* 11/07/2013   4) Insomnia- uses xanax as needed. Reports that she sleeps well.     Review of Systems See HPI  Past Medical History  Diagnosis Date  . Hypertension   . Thyroid disease     hyperthyroidism  . Hyperlipidemia   . Sleep apnea     History   Social History  . Marital Status: Divorced    Spouse Name: N/A    Number of Children: 2  . Years of Education: N/A   Occupational History  . retired    Social History Main Topics  . Smoking status: Current Every Day Smoker -- 1.50 packs/day  . Smokeless tobacco: Never Used  . Alcohol Use: No  . Drug Use: Not on file  . Sexual Activity: Not on file   Other Topics Concern  . Not on file   Social History Narrative   Moved from [place], [place] 2009    Past Surgical History  Procedure Laterality Date  . Abdominal hysterectomy  1965  . Hemorroidectomy  1957  . Breast lumpectomy  1966, 1971    Family History  Problem Relation Age of Onset  . Diabetes    . Hyperlipidemia Neg Hx   . Heart attack Neg Hx   . Hypertension Neg Hx   . Sudden death Neg Hx   . Colon cancer      grandson    No Known Allergies  Current Outpatient Prescriptions on File Prior to Visit  Medication Sig Dispense Refill  . acetaminophen (TYLENOL) 500 MG tablet Take 500 mg by mouth 2 (two) times daily as needed.      Marland Kitchen alendronate (FOSAMAX) 70 MG tablet Take 1 tablet (70 mg total) by mouth every 7 (seven) days. Take with a full glass of  water on an empty stomach.  4 tablet  11  . ALPRAZolam (XANAX) 0.5 MG tablet Take 1 tablet (0.5 mg total) by mouth at bedtime as needed for anxiety.  30 tablet  0  . amLODipine (NORVASC) 5 MG tablet TAKE 1 TABLET (5 MG TOTAL) BY MOUTH DAILY.  30 tablet  1  . aspirin 81 MG tablet Take 81 mg by mouth daily.        Marland Kitchen atorvastatin (LIPITOR) 20 MG tablet TAKE 1 TABLET (20 MG TOTAL) BY MOUTH DAILY.  30 tablet  1  . bisacodyl (BISACODYL) 5 MG EC tablet Take 5 mg by mouth daily as needed.      . Calcium Carbonate-Vitamin D (CALTRATE 600+D PO) Take 1 tablet by mouth 2 (two) times daily.      . Cholecalciferol (VITAMIN D3) 2000 UNITS TABS Take 1 tablet by mouth daily.        . diphenhydrAMINE (BENADRYL) 25 MG tablet Take 25 mg by mouth at bedtime as needed for sleep.      Marland Kitchen glucosamine-chondroitin 500-400 MG tablet Take 1 tablet by mouth 2 (two) times daily.      Marland Kitchen  mirabegron ER (MYRBETRIQ) 50 MG TB24 Take 50 mg by mouth daily.      . polycarbophil (FIBERCON) 625 MG tablet Take 625 mg by mouth daily.        . traMADol (ULTRAM) 50 MG tablet TAKE 1 TABLET BY MOUTH EVERY 8 HOURS AS NEEDED FOR BACK OR SHOULDER PAIN  90 tablet  0   No current facility-administered medications on file prior to visit.    BP 146/81  Pulse 77  Temp(Src) 98.8 F (37.1 C) (Oral)  Resp 16  Ht 5' 2.25" (1.581 m)  Wt 113 lb (51.256 kg)  BMI 20.51 kg/m2  SpO2 93%       Objective:   Physical Exam  Constitutional: She is oriented to person, place, and time. She appears well-developed and well-nourished. No distress.  HENT:  Head: Normocephalic and atraumatic.  Cardiovascular: Normal rate and regular rhythm.   No murmur heard. Pulmonary/Chest: Effort normal. No respiratory distress. She has no wheezes.  Diminished breath sounds throughtout  Musculoskeletal: She exhibits no edema.  Neurological: She is alert and oriented to person, place, and time.  Skin: Skin is warm and dry.  Psychiatric: She has a normal mood and  affect. Her behavior is normal. Judgment and thought content normal.          Assessment & Plan:

## 2014-02-13 ENCOUNTER — Encounter: Payer: Self-pay | Admitting: Family

## 2014-02-13 NOTE — Telephone Encounter (Signed)
Please see DPR scanned in to Media tab (01/31/14) granting access to daughter [name] and Curator, [name]. I have updated demographics to reflect authorization.

## 2014-02-13 NOTE — Telephone Encounter (Signed)
Notified pt's daughter via email.

## 2014-02-13 NOTE — Telephone Encounter (Signed)
Please let daughter know that I will discuss with her mom at her next visit.  We can consider decreasing her dose at that time or discussing other options.

## 2014-02-27 ENCOUNTER — Encounter: Payer: Self-pay | Admitting: Family

## 2014-03-01 ENCOUNTER — Encounter: Payer: Self-pay | Admitting: Family

## 2014-03-09 ENCOUNTER — Other Ambulatory Visit: Payer: Self-pay | Admitting: Family

## 2014-03-09 NOTE — Telephone Encounter (Signed)
Last given, #30 on 02/07/14.  Please advise.

## 2014-03-09 NOTE — Telephone Encounter (Signed)
Ok to send 30 tabs zero refills. 

## 2014-03-09 NOTE — Telephone Encounter (Signed)
Sarah [name] [id number] [facility]  Sarah [name] called needing a refill on her ALPRAZolam [name]) 0.5 MG tablet, she would like for her daughter to pick up when she get off work at lunch time.

## 2014-03-09 NOTE — Telephone Encounter (Signed)
Rx called to [place] at [facility]. Notified pt's daughter.

## 2014-03-19 ENCOUNTER — Other Ambulatory Visit: Payer: Self-pay | Admitting: Family

## 2014-04-09 ENCOUNTER — Telehealth: Payer: Self-pay

## 2014-04-09 NOTE — Telephone Encounter (Signed)
Please advise 

## 2014-04-09 NOTE — Telephone Encounter (Signed)
Pt is requesting refill on Alprazolam. Sarah Pt.   Last OV: 02/12/2014 Last Fill: 03/09/2014 # 30 0RF UDS: None  Please advise.

## 2014-04-09 NOTE — Telephone Encounter (Signed)
Giving recent notes concerning possible dependency on medication, I will defer refill to [facility].  If she ok's first then I will fill. Please forward this message to [facility] after reading.

## 2014-04-10 MED ORDER — ALPRAZOLAM 0.5 MG PO TABS: ORAL_TABLET | ORAL | Status: DC

## 2014-04-10 NOTE — Addendum Note (Signed)
Addended by: Wilfrid Lund on: 04/10/2014 10:02 AM   Modules accepted: Orders

## 2014-04-10 NOTE — Telephone Encounter (Signed)
Faxed to [facility].

## 2014-04-10 NOTE — Telephone Encounter (Signed)
Printed and signed by [name].

## 2014-04-10 NOTE — Telephone Encounter (Signed)
Ok to send 30 tabs zero refills. 

## 2014-05-08 ENCOUNTER — Ambulatory Visit (INDEPENDENT_AMBULATORY_CARE_PROVIDER_SITE_OTHER): Payer: Medicare Other | Admitting: Physician Assistant

## 2014-05-08 ENCOUNTER — Encounter: Payer: Self-pay | Admitting: Physician Assistant

## 2014-05-08 ENCOUNTER — Other Ambulatory Visit: Payer: Self-pay | Admitting: *Deleted

## 2014-05-08 VITALS — BP 104/69 | HR 86 | Temp 98.0°F | Resp 16 | Ht 62.25 in | Wt 122.0 lb

## 2014-05-08 DIAGNOSIS — M545 Low back pain, unspecified: Secondary | ICD-10-CM

## 2014-05-08 MED ORDER — CELECOXIB 100 MG PO CAPS: 100.0000 mg | ORAL_CAPSULE | Freq: Every day | ORAL | Status: DC

## 2014-05-08 MED ORDER — ALPRAZOLAM 0.5 MG PO TABS: ORAL_TABLET | ORAL | Status: DC

## 2014-05-08 NOTE — Patient Instructions (Signed)
Please take celebrex once daily as directed.   Continue [employer].  Avoid heavy lifting or overexertion.  If you are having any residual symptoms, you will need an x-ray.

## 2014-05-08 NOTE — Telephone Encounter (Signed)
Pt's daughter called requesting refill of pt's alprazolam. Last refill 04/10/14. Advised pt rx will be addressed tomorrow. Rx printed and forwarded to Provider for signature.

## 2014-05-09 ENCOUNTER — Telehealth: Payer: Self-pay | Admitting: Family

## 2014-05-09 DIAGNOSIS — M545 Low back pain, unspecified: Secondary | ICD-10-CM

## 2014-05-09 NOTE — Telephone Encounter (Signed)
Done

## 2014-05-09 NOTE — Telephone Encounter (Signed)
Caller name: [name] Relation to TH:YHOOILNZ Call back number: [phone] Pharmacy:  Reason for call: Wants to talk to you about her mother.

## 2014-05-09 NOTE — Telephone Encounter (Signed)
Per VO from provider [facility] to inform caller that Mr. Sarah [name] is Not willing to prescribe stronger pain medication d/t addictive issues discussed at [place] 01.05.16, patient can take the Celebrex twice daily and can request to have Imaging [name] to see if order for Rehab is validated from results/SLS

## 2014-05-09 NOTE — Telephone Encounter (Signed)
LMOM with contact name and number for return call RE: questions concerning her mother's OV 01.05.16 and requested that she leave more detailed message if unavailable to speak with her at that time/SLS

## 2014-05-09 NOTE — Telephone Encounter (Signed)
Rx faxed to [facility].

## 2014-05-09 NOTE — Telephone Encounter (Signed)
Sarah [name] is returning your call  Please call her.  Pain pill prescribed yesterday is not working  Can Mom try a tens unit from rehab or a stronger pain med

## 2014-05-09 NOTE — Telephone Encounter (Signed)
Please call patient's daughter (has access to [facility]) regarding her concerns.

## 2014-05-09 NOTE — Telephone Encounter (Signed)
Spoke with pt's daughter, she would like to get order for xray to be completed and requests that we call her once order has been placed. She can be reached at wk# [phone].  Please advise.

## 2014-05-09 NOTE — Telephone Encounter (Signed)
Notified pts daughter

## 2014-05-09 NOTE — Addendum Note (Signed)
Addended by: Raiford Noble on: 05/09/2014 03:14 PM   Modules accepted: Orders

## 2014-05-10 ENCOUNTER — Ambulatory Visit (HOSPITAL_BASED_OUTPATIENT_CLINIC_OR_DEPARTMENT_OTHER)
Admission: RE | Admit: 2014-05-10 | Discharge: 2014-05-10 | Disposition: A | Discharge status: Home / Self Care | Payer: Medicare Other | Source: Ambulatory Visit | Attending: Physician Assistant | Admitting: Physician Assistant

## 2014-05-10 DIAGNOSIS — M545 Low back pain, unspecified: Secondary | ICD-10-CM

## 2014-05-10 DIAGNOSIS — W19XXXA Unspecified fall, initial encounter: Secondary | ICD-10-CM | POA: Insufficient documentation

## 2014-05-11 ENCOUNTER — Other Ambulatory Visit: Payer: Self-pay | Admitting: Physician Assistant

## 2014-05-11 DIAGNOSIS — M545 Low back pain, unspecified: Secondary | ICD-10-CM

## 2014-05-12 NOTE — Progress Notes (Signed)
Patient presents to clinic today with her daughter and granddaughter complaining of intermittent left-sided low back pain x 1.5 weeks after falling from her chair.  Denies trauma to head.  Denies bruising or decreased ROM. Daughter endorses patient states the pain is only present in the evening. Patient states she needs one of those strong "pain pills" to make her better.  Daughter and granddaughter illicit patient with history of pain killer addiction.  Patient does not confirm or deny.   Past Medical History  Diagnosis Date  . Hypertension   . Thyroid disease     hyperthyroidism  . Hyperlipidemia   . Sleep apnea     Current Outpatient Prescriptions on File Prior to Visit  Medication Sig Dispense Refill  . acetaminophen (TYLENOL) 500 MG tablet Take 500 mg by mouth 2 (two) times daily as needed.    Marland Kitchen alendronate (FOSAMAX) 70 MG tablet Take 1 tablet (70 mg total) by mouth every 7 (seven) days. Take with a full glass of water on an empty stomach. 4 tablet 11  . amLODipine (NORVASC) 5 MG tablet TAKE 1 TABLET (5 MG TOTAL) BY MOUTH DAILY. 30 tablet 2  . aspirin 81 MG tablet Take 81 mg by mouth daily.      Marland Kitchen atorvastatin (LIPITOR) 20 MG tablet TAKE 1 TABLET (20 MG TOTAL) BY MOUTH DAILY. 30 tablet 2  . bisacodyl (BISACODYL) 5 MG EC tablet Take 5 mg by mouth daily as needed.    . Calcium Carbonate-Vitamin D (CALTRATE 600+D PO) Take 1 tablet by mouth 2 (two) times daily.    . Cholecalciferol (VITAMIN D3) 2000 UNITS TABS Take 1 tablet by mouth daily.      . diphenhydrAMINE (BENADRYL) 25 MG tablet Take 25 mg by mouth at bedtime as needed for sleep.    Marland Kitchen glucosamine-chondroitin 500-400 MG tablet Take 1 tablet by mouth 2 (two) times daily.    . mirabegron ER (MYRBETRIQ) 50 MG TB24 Take 50 mg by mouth daily.    . polycarbophil (FIBERCON) 625 MG tablet Take 625 mg by mouth daily.       No current facility-administered medications on file prior to visit.    No Known Allergies  Family History    Problem Relation Age of Onset  . Diabetes    . Hyperlipidemia Neg Hx   . Heart attack Neg Hx   . Hypertension Neg Hx   . Sudden death Neg Hx   . Colon cancer      grandson    History   Social History  . Marital Status: Divorced    Spouse Name: N/A    Number of Children: 2  . Years of Education: N/A   Occupational History  . retired    Social History Main Topics  . Smoking status: Current Every Day Smoker -- 1.50 packs/day  . Smokeless tobacco: Never Used  . Alcohol Use: No  . Drug Use: None  . Sexual Activity: None   Other Topics Concern  . None   Social History Narrative   Moved from [place], [place] 2009    Review of Systems - See HPI.  All other ROS are negative.  BP 104/69 mmHg  Pulse 86  Temp(Src) 98 F (36.7 C) (Oral)  Resp 16  Ht 5' 2.25" (1.581 m)  Wt 122 lb (55.339 kg)  BMI 22.14 kg/m2  SpO2 97%  Physical Exam  Constitutional: She is oriented to person, place, and time and well-developed, well-nourished, and in no distress.  HENT:  Head: Normocephalic.  Cardiovascular: Normal rate, regular rhythm, normal heart sounds and intact distal pulses.   Pulmonary/Chest: Effort normal and breath sounds normal. No respiratory distress. She has no wheezes. She has no rales. She exhibits no tenderness.  Musculoskeletal:       Cervical back: Normal.       Thoracic back: Normal.       Lumbar back: She exhibits pain. She exhibits no tenderness, no bony tenderness and no spasm.  Neurological: She is alert and oriented to person, place, and time.  Skin: Skin is warm and dry. No rash noted.    Recent Results (from the past 2160 hour(s))  HgB A1c     Status: None   Collection Time: 02/12/14 10:26 AM  Result Value Ref Range   Hgb A1c MFr Bld 5.9 4.6 - 6.5 %    Comment: Glycemic Control Guidelines for People with Diabetes:Non Diabetic:  <6%Goal of Therapy: <7%Additional Action Suggested:  >8%   Vit D  25 hydroxy (rtn osteoporosis monitoring)     Status:  None   Collection Time: 02/12/14 10:26 AM  Result Value Ref Range   VITD 48.06 30.00 - 100.00 ng/mL  Basic Metabolic Panel (BMET)     Status: Abnormal   Collection Time: 02/12/14 10:26 AM  Result Value Ref Range   Sodium 142 135 - 145 mEq/L   Potassium 5.1 3.5 - 5.1 mEq/L   Chloride 105 96 - 112 mEq/L   CO2 26 19 - 32 mEq/L   Glucose, Bld 114 (H) 70 - 99 mg/dL   BUN 14 6 - 23 mg/dL   Creatinine, Ser 0.8 0.4 - 1.2 mg/dL   Calcium 10.2 8.4 - 10.5 mg/dL   GFR 75.57 >60.00 mL/min    Assessment/Plan: Back pain No pain at time of interview.  Exam within normal limits. Patient demanding "pain pill" from start of interview.  Has history of pain pill abuse per daughter and granddaughter.  There is note on her recent UDS screen that she is a high-risk and needs frequent screening.  UDS without abnormal findings. Will begin Celebrex 100 mg up to BID over the next week.  Avoid heavy lifting or overexertions.  Giving age and risk for fracture, recommended x-ray but patient declines.

## 2014-05-12 NOTE — Assessment & Plan Note (Signed)
No pain at time of interview.  Exam within normal limits. Patient demanding "pain pill" from start of interview.  Has history of pain pill abuse per daughter and granddaughter.  There is note on her recent UDS screen that she is a high-risk and needs frequent screening.  UDS without abnormal findings. Will begin Celebrex 100 mg up to BID over the next week.  Avoid heavy lifting or overexertions.  Giving age and risk for fracture, recommended x-ray but patient declines.

## 2014-05-14 ENCOUNTER — Encounter: Payer: Self-pay | Admitting: Family

## 2014-05-14 NOTE — Telephone Encounter (Signed)
See additional mychart message

## 2014-05-14 NOTE — Telephone Encounter (Signed)
Attempted work # [phone] and received voicemail.  Will try later.

## 2014-05-14 NOTE — Telephone Encounter (Signed)
Melissa FYI:  Spoke with pt's daughter.  She states tens unit will cost pt $100. Wants to hold off on purchasing unit. Referral was placed by PA, Hassell Done for PT but pt's daughter states pt saw Leonette Most years ago for PT and pt would not continue the exercises with family at home. States she feels pt will not go through with PT referral. Pt talked with her brother over the weekend and was told he got a shot of tramadol years ago and it "cured" his back pain. Feels like pt will ask for tramadol rx. Daughter states she just doesn't want her mother placed on anything that could be addictive.  Pt has f/u with PCP on 05/15/14.

## 2014-05-15 ENCOUNTER — Ambulatory Visit (INDEPENDENT_AMBULATORY_CARE_PROVIDER_SITE_OTHER): Payer: Medicare Other | Admitting: Family

## 2014-05-15 ENCOUNTER — Encounter: Payer: Self-pay | Admitting: Family

## 2014-05-15 VITALS — BP 116/78 | HR 107 | Temp 97.8°F | Resp 16 | Ht 62.25 in | Wt 120.2 lb

## 2014-05-15 DIAGNOSIS — Z23 Encounter for immunization: Secondary | ICD-10-CM

## 2014-05-15 DIAGNOSIS — M545 Low back pain, unspecified: Secondary | ICD-10-CM

## 2014-05-15 DIAGNOSIS — E785 Hyperlipidemia, unspecified: Secondary | ICD-10-CM

## 2014-05-15 DIAGNOSIS — E0590 Thyrotoxicosis, unspecified without thyrotoxic crisis or storm: Secondary | ICD-10-CM | POA: Diagnosis not present

## 2014-05-15 DIAGNOSIS — R7303 Prediabetes: Secondary | ICD-10-CM

## 2014-05-15 DIAGNOSIS — G4733 Obstructive sleep apnea (adult) (pediatric): Secondary | ICD-10-CM

## 2014-05-15 DIAGNOSIS — I10 Essential (primary) hypertension: Secondary | ICD-10-CM | POA: Diagnosis not present

## 2014-05-15 DIAGNOSIS — R7309 Other abnormal glucose: Secondary | ICD-10-CM

## 2014-05-15 DIAGNOSIS — G4700 Insomnia, unspecified: Secondary | ICD-10-CM

## 2014-05-15 DIAGNOSIS — Z720 Tobacco use: Secondary | ICD-10-CM

## 2014-05-15 DIAGNOSIS — Z79899 Other long term (current) drug therapy: Secondary | ICD-10-CM | POA: Diagnosis not present

## 2014-05-15 LAB — LIPID PANEL
CHOL/HDL RATIO: 3
Cholesterol: 224 mg/dL — ABNORMAL HIGH (ref 0–200)
HDL: 80.3 mg/dL (ref >39.00)
LDL Cholesterol: 124 mg/dL — ABNORMAL HIGH (ref 0–99)
NonHDL: 143.7
TRIGLYCERIDES: 98 mg/dL (ref 0.0–149.0)
VLDL: 19.6 mg/dL (ref 0.0–40.0)

## 2014-05-15 LAB — TSH: TSH: 0.88 u[IU]/mL (ref 0.35–4.50)

## 2014-05-15 MED ORDER — METHYLPREDNISOLONE 4 MG PO KIT: PACK | ORAL | Status: DC

## 2014-05-15 MED ORDER — KETOROLAC TROMETHAMINE 30 MG/ML IJ SOLN
30.0000 mg | Freq: Once | INTRAMUSCULAR | Status: AC
  Administered 2014-05-15: 30 mg via INTRAMUSCULAR

## 2014-05-15 NOTE — Assessment & Plan Note (Signed)
Uncontrolled. Will give IM toradol here in the office, to be followed by medrol dose pak.

## 2014-05-15 NOTE — Assessment & Plan Note (Signed)
Diagnosis is noted in chart. Pt and family deny known hx. [place] daughter does not note snoring. [place] daughter does not think pt will follow through with sleep study but will discuss further with pt and let me know if she is agreeable.

## 2014-05-15 NOTE — Assessment & Plan Note (Signed)
BP is stable. Continue current meds.  °

## 2014-05-15 NOTE — Addendum Note (Signed)
Addended by: Kelle Darting A on: 05/15/2014 11:24 AM   Modules accepted: Orders

## 2014-05-15 NOTE — Patient Instructions (Addendum)
Please complete lab work prior to leaving. They will also perform a UDS. Start medrol dose pak for back pain. I would like you to have a sleep study at some point when you are willing- please let me know if you decide you would like to proceed with the sleeps study. Follow up in 1 month.

## 2014-05-15 NOTE — Assessment & Plan Note (Signed)
Tolerating statin, obtain flp, continue statin.

## 2014-05-15 NOTE — Assessment & Plan Note (Signed)
Counseled on smoking cessation- + wheezing noted today- medrol dose pak should help this.

## 2014-05-15 NOTE — Progress Notes (Signed)
Pre visit review using our clinic review tool, if applicable. No additional management support is needed unless otherwise documented below in the visit note. 

## 2014-05-15 NOTE — Assessment & Plan Note (Signed)
Last A1C mildly elevated, plan to repeat next visit.

## 2014-05-15 NOTE — Assessment & Plan Note (Signed)
Uncontrolled due to back pain, she continues xanax.  Obtain follow up UDS.

## 2014-05-15 NOTE — Telephone Encounter (Signed)
Spoke with pt's daughter. She states pt has never had sleep study and that diagnosis of sleep apnea is inaccurate.

## 2014-05-15 NOTE — Progress Notes (Signed)
Subjective:    Patient ID: Sarah [name], female    DOB: February 11, 1932, 81 y.o.   MRN: [id number]  HPI  Sarah [name] is an 81 yr old female who presents today for follow up of multiple medical problems.  1) HTN-  Patient is currently maintained on the following medications for blood pressure: amlodipine 5mg . Patient reports good compliance with blood pressure medications. Patient denies chest pain, or swelling. Last 3 blood pressure readings in our office are as follows:   BP Readings from Last 3 Encounters:  05/15/14 116/78  05/08/14 104/69  02/12/14 146/81   2) Hyperglycemia-   Lab Results  Component Value Date   HGBA1C 5.9 02/12/2014   3) Hyperlipidemia-  Patient is currently maintained on the following medication for hyperlipidemia: lipitor Last lipid panel as follows:   Lab Results  Component Value Date   CHOL 202* 05/09/2013   HDL 74 05/09/2013   LDLCALC 109* 05/09/2013   TRIG 94 05/09/2013   CHOLHDL 2.7 05/09/2013   Patient denies myalgia. Patient reports good compliance with low fat/low cholesterol diet.   4) Back Pain- reports L low back pain not improving with celebrex.  Reports that her pain is 10/10.  Non radiating, brings her to tears.  Symptoms started about 3.5 weeks ago.  Had a fall fell out of a recliner.  5) insomnia- maintained on prn xanax.  Not sleeping well due to pain.  Last dose was last night  6) Hx of OSA- no snoring, denies hx of sleep study, denies hx or sleep apnea.    Lab Results  Component Value Date   TSH 0.732 10/14/2012      Review of Systems See HPI  Past Medical History  Diagnosis Date  . Hypertension   . Thyroid disease     hyperthyroidism  . Hyperlipidemia   . Sleep apnea     History   Social History  . Marital Status: Divorced    Spouse Name: N/A    Number of Children: 2  . Years of Education: N/A   Occupational History  . retired    Social History Main Topics  . Smoking status: Current Every Day  Smoker -- 1.50 packs/day  . Smokeless tobacco: Never Used  . Alcohol Use: No  . Drug Use: Not on file  . Sexual Activity: Not on file   Other Topics Concern  . Not on file   Social History Narrative   Moved from [place], [place] 2009    Past Surgical History  Procedure Laterality Date  . Abdominal hysterectomy  1965  . Hemorroidectomy  1957  . Breast lumpectomy  1966, 1971    Family History  Problem Relation Age of Onset  . Diabetes    . Hyperlipidemia Neg Hx   . Heart attack Neg Hx   . Hypertension Neg Hx   . Sudden death Neg Hx   . Colon cancer      grandson    No Known Allergies  Current Outpatient Prescriptions on File Prior to Visit  Medication Sig Dispense Refill  . acetaminophen (TYLENOL) 500 MG tablet Take 500 mg by mouth 2 (two) times daily as needed.    Marland Kitchen alendronate (FOSAMAX) 70 MG tablet Take 1 tablet (70 mg total) by mouth every 7 (seven) days. Take with a full glass of water on an empty stomach. 4 tablet 11  . ALPRAZolam (XANAX) 0.5 MG tablet TAKE 1 TABLET BY MOUTH AT BEDTIME AS NEEDED FOR ANXIETY 30 tablet  0  . amLODipine (NORVASC) 5 MG tablet TAKE 1 TABLET (5 MG TOTAL) BY MOUTH DAILY. 30 tablet 2  . aspirin 81 MG tablet Take 81 mg by mouth daily.      Marland Kitchen atorvastatin (LIPITOR) 20 MG tablet TAKE 1 TABLET (20 MG TOTAL) BY MOUTH DAILY. 30 tablet 2  . bisacodyl (BISACODYL) 5 MG EC tablet Take 5 mg by mouth daily as needed.    . Calcium Carbonate-Vitamin D (CALTRATE 600+D PO) Take 1 tablet by mouth 2 (two) times daily.    . celecoxib (CELEBREX) 100 MG capsule Take 1 capsule (100 mg total) by mouth daily. 15 capsule 0  . Cholecalciferol (VITAMIN D3) 2000 UNITS TABS Take 1 tablet by mouth daily.      . diphenhydrAMINE (BENADRYL) 25 MG tablet Take 25 mg by mouth at bedtime as needed for sleep.    Marland Kitchen glucosamine-chondroitin 500-400 MG tablet Take 1 tablet by mouth 2 (two) times daily.    . mirabegron ER (MYRBETRIQ) 50 MG TB24 Take 50 mg by mouth daily.    .  polycarbophil (FIBERCON) 625 MG tablet Take 625 mg by mouth daily.       No current facility-administered medications on file prior to visit.    BP 116/78 mmHg  Pulse 107  Temp(Src) 97.8 F (36.6 C) (Oral)  Resp 16  Ht 5' 2.25" (1.581 m)  Wt 120 lb 3.2 oz (54.522 kg)  BMI 21.81 kg/m2  SpO2 97%        Objective:   Physical Exam  Constitutional: She is oriented to person, place, and time. She appears well-developed and well-nourished. No distress.  HENT:  Head: Normocephalic and atraumatic.  Cardiovascular: Normal rate and regular rhythm.   No murmur heard. Pulmonary/Chest: Effort normal. She has wheezes.  Musculoskeletal: She exhibits no edema.  Neurological: She is alert and oriented to person, place, and time.  Skin: Skin is warm and dry.  Psychiatric: She has a normal mood and affect. Her behavior is normal. Judgment and thought content normal.          Assessment & Plan:

## 2014-05-16 ENCOUNTER — Telehealth: Payer: Self-pay | Admitting: Family

## 2014-05-16 ENCOUNTER — Encounter: Payer: Self-pay | Admitting: Family

## 2014-05-16 DIAGNOSIS — E785 Hyperlipidemia, unspecified: Secondary | ICD-10-CM

## 2014-05-16 MED ORDER — ATORVASTATIN CALCIUM 40 MG PO TABS: 40.0000 mg | ORAL_TABLET | Freq: Every day | ORAL | Status: DC

## 2014-05-16 NOTE — Telephone Encounter (Signed)
Left detailed message on voicemail and to call back Friday to scheduled lab appt.

## 2014-05-16 NOTE — Telephone Encounter (Signed)
Cholesterol remains mildly elevated. Increase atorvastatin to 40mg , repeat FLP in 6 weeks. Dx hyperlipidemia.

## 2014-05-18 NOTE — Telephone Encounter (Signed)
Lab appt scheduled for 06/29/14 at 9:30am. Lab order entered.  Daughter reports that pt continues to have back pain. Wonders if pt should go through with PT?

## 2014-05-18 NOTE — Telephone Encounter (Signed)
Hold off on PT for now. If symptoms are not improved in 1 week, will order MRI.

## 2014-05-21 NOTE — Telephone Encounter (Signed)
Spoke with patient's daughter who verbalized understanding.  She stated that pain was not relieved by Tylenol like it usually is and she will call back in one week or if symptoms worsen or fail to improve to schedule an appointment.    eal

## 2014-05-22 ENCOUNTER — Encounter: Payer: Self-pay | Admitting: Family

## 2014-05-22 DIAGNOSIS — M545 Low back pain: Secondary | ICD-10-CM

## 2014-05-23 ENCOUNTER — Telehealth: Payer: Self-pay | Admitting: Family

## 2014-05-23 NOTE — Telephone Encounter (Signed)
See my chart message

## 2014-05-24 ENCOUNTER — Telehealth: Payer: Self-pay | Admitting: Family

## 2014-05-24 MED ORDER — TRAMADOL HCL 50 MG PO TABS: 50.0000 mg | ORAL_TABLET | Freq: Three times a day (TID) | ORAL | Status: DC | PRN

## 2014-05-24 NOTE — Telephone Encounter (Signed)
Daughter reports that her mother's pain has been severe in back. She is scheduled for mri, requests something for pain, ibuprofen not working.

## 2014-05-28 ENCOUNTER — Telehealth: Payer: Self-pay | Admitting: Family

## 2014-05-28 NOTE — Telephone Encounter (Signed)
Spoke with daughter 1/21 who reports that she has not been giving the patient any xanax and that is why her uds is negative.

## 2014-06-02 ENCOUNTER — Ambulatory Visit (HOSPITAL_BASED_OUTPATIENT_CLINIC_OR_DEPARTMENT_OTHER)
Admission: RE | Admit: 2014-06-02 | Discharge: 2014-06-02 | Disposition: A | Discharge status: Home / Self Care | Payer: Medicare Other | Source: Ambulatory Visit | Attending: Family | Admitting: Family

## 2014-06-02 DIAGNOSIS — I4891 Unspecified atrial fibrillation: Secondary | ICD-10-CM | POA: Diagnosis not present

## 2014-06-02 DIAGNOSIS — F17210 Nicotine dependence, cigarettes, uncomplicated: Secondary | ICD-10-CM | POA: Diagnosis not present

## 2014-06-02 DIAGNOSIS — M479 Spondylosis, unspecified: Secondary | ICD-10-CM | POA: Insufficient documentation

## 2014-06-02 DIAGNOSIS — M5432 Sciatica, left side: Secondary | ICD-10-CM

## 2014-06-02 DIAGNOSIS — Z7901 Long term (current) use of anticoagulants: Secondary | ICD-10-CM | POA: Diagnosis not present

## 2014-06-02 DIAGNOSIS — I5031 Acute diastolic (congestive) heart failure: Secondary | ICD-10-CM | POA: Diagnosis not present

## 2014-06-02 DIAGNOSIS — I5041 Acute combined systolic (congestive) and diastolic (congestive) heart failure: Secondary | ICD-10-CM | POA: Diagnosis not present

## 2014-06-02 DIAGNOSIS — Z720 Tobacco use: Secondary | ICD-10-CM | POA: Diagnosis not present

## 2014-06-02 DIAGNOSIS — M4806 Spinal stenosis, lumbar region: Secondary | ICD-10-CM | POA: Insufficient documentation

## 2014-06-02 DIAGNOSIS — D649 Anemia, unspecified: Secondary | ICD-10-CM | POA: Diagnosis not present

## 2014-06-02 DIAGNOSIS — R312 Other microscopic hematuria: Secondary | ICD-10-CM | POA: Diagnosis not present

## 2014-06-02 DIAGNOSIS — Z885 Allergy status to narcotic agent status: Secondary | ICD-10-CM | POA: Diagnosis not present

## 2014-06-02 DIAGNOSIS — R0602 Shortness of breath: Secondary | ICD-10-CM | POA: Diagnosis not present

## 2014-06-02 DIAGNOSIS — I340 Nonrheumatic mitral (valve) insufficiency: Secondary | ICD-10-CM | POA: Diagnosis not present

## 2014-06-02 DIAGNOSIS — R918 Other nonspecific abnormal finding of lung field: Secondary | ICD-10-CM | POA: Diagnosis not present

## 2014-06-02 DIAGNOSIS — I10 Essential (primary) hypertension: Secondary | ICD-10-CM | POA: Diagnosis not present

## 2014-06-02 DIAGNOSIS — I429 Cardiomyopathy, unspecified: Secondary | ICD-10-CM | POA: Diagnosis not present

## 2014-06-02 DIAGNOSIS — I447 Left bundle-branch block, unspecified: Secondary | ICD-10-CM | POA: Diagnosis not present

## 2014-06-02 DIAGNOSIS — M545 Low back pain: Secondary | ICD-10-CM

## 2014-06-02 DIAGNOSIS — E785 Hyperlipidemia, unspecified: Secondary | ICD-10-CM | POA: Diagnosis not present

## 2014-06-02 DIAGNOSIS — R062 Wheezing: Secondary | ICD-10-CM | POA: Diagnosis not present

## 2014-06-02 DIAGNOSIS — E876 Hypokalemia: Secondary | ICD-10-CM | POA: Diagnosis not present

## 2014-06-03 ENCOUNTER — Telehealth: Payer: Self-pay | Admitting: Family

## 2014-06-03 ENCOUNTER — Inpatient Hospital Stay (HOSPITAL_BASED_OUTPATIENT_CLINIC_OR_DEPARTMENT_OTHER)
Admission: EM | Admit: 2014-06-03 | Discharge: 2014-06-06 | DRG: 308 | Disposition: A | Discharge status: Home / Self Care | Payer: Medicare Other | Attending: Cardiology | Admitting: Cardiology

## 2014-06-03 ENCOUNTER — Encounter (HOSPITAL_BASED_OUTPATIENT_CLINIC_OR_DEPARTMENT_OTHER): Payer: Self-pay | Admitting: *Deleted

## 2014-06-03 ENCOUNTER — Emergency Department (HOSPITAL_BASED_OUTPATIENT_CLINIC_OR_DEPARTMENT_OTHER): Payer: Medicare Other

## 2014-06-03 DIAGNOSIS — I10 Essential (primary) hypertension: Secondary | ICD-10-CM | POA: Diagnosis present

## 2014-06-03 DIAGNOSIS — I5041 Acute combined systolic (congestive) and diastolic (congestive) heart failure: Secondary | ICD-10-CM | POA: Diagnosis present

## 2014-06-03 DIAGNOSIS — M545 Low back pain: Secondary | ICD-10-CM

## 2014-06-03 DIAGNOSIS — I447 Left bundle-branch block, unspecified: Secondary | ICD-10-CM | POA: Diagnosis present

## 2014-06-03 DIAGNOSIS — R312 Other microscopic hematuria: Secondary | ICD-10-CM | POA: Diagnosis present

## 2014-06-03 DIAGNOSIS — E876 Hypokalemia: Secondary | ICD-10-CM | POA: Diagnosis present

## 2014-06-03 DIAGNOSIS — F17210 Nicotine dependence, cigarettes, uncomplicated: Secondary | ICD-10-CM | POA: Diagnosis present

## 2014-06-03 DIAGNOSIS — E785 Hyperlipidemia, unspecified: Secondary | ICD-10-CM | POA: Diagnosis present

## 2014-06-03 DIAGNOSIS — I340 Nonrheumatic mitral (valve) insufficiency: Secondary | ICD-10-CM | POA: Diagnosis present

## 2014-06-03 DIAGNOSIS — D649 Anemia, unspecified: Secondary | ICD-10-CM | POA: Diagnosis present

## 2014-06-03 DIAGNOSIS — Z7901 Long term (current) use of anticoagulants: Secondary | ICD-10-CM

## 2014-06-03 DIAGNOSIS — I4891 Unspecified atrial fibrillation: Principal | ICD-10-CM | POA: Diagnosis present

## 2014-06-03 DIAGNOSIS — Z885 Allergy status to narcotic agent status: Secondary | ICD-10-CM

## 2014-06-03 DIAGNOSIS — I429 Cardiomyopathy, unspecified: Secondary | ICD-10-CM | POA: Diagnosis present

## 2014-06-03 DIAGNOSIS — I519 Heart disease, unspecified: Secondary | ICD-10-CM

## 2014-06-03 DIAGNOSIS — R3129 Other microscopic hematuria: Secondary | ICD-10-CM | POA: Diagnosis present

## 2014-06-03 HISTORY — DX: Unspecified atrial fibrillation: I48.91

## 2014-06-03 HISTORY — DX: Anemia, unspecified: D64.9

## 2014-06-03 HISTORY — DX: Left bundle-branch block, unspecified: I44.7

## 2014-06-03 LAB — CBC WITH DIFFERENTIAL/PLATELET
BASOS ABS: 0.1 10*3/uL (ref 0.0–0.1)
Basophils Relative: 1 % (ref 0–1)
EOS ABS: 0.2 10*3/uL (ref 0.0–0.7)
EOS PCT: 2 % (ref 0–5)
HCT: 36.1 % (ref 36.0–46.0)
HEMOGLOBIN: 11.7 g/dL — AB (ref 12.0–15.0)
LYMPHS ABS: 2.1 10*3/uL (ref 0.7–4.0)
Lymphocytes Relative: 23 % (ref 12–46)
MCH: 32.6 pg (ref 26.0–34.0)
MCHC: 32.4 g/dL (ref 30.0–36.0)
MCV: 100.6 fL — ABNORMAL HIGH (ref 78.0–100.0)
MONO ABS: 0.9 10*3/uL (ref 0.1–1.0)
MONOS PCT: 10 % (ref 3–12)
NEUTROS ABS: 5.9 10*3/uL (ref 1.7–7.7)
Neutrophils Relative %: 64 % (ref 43–77)
Platelets: 272 10*3/uL (ref 150–400)
RBC: 3.59 MIL/uL — AB (ref 3.87–5.11)
RDW: 14.9 % (ref 11.5–15.5)
WBC: 9.2 10*3/uL (ref 4.0–10.5)

## 2014-06-03 LAB — BASIC METABOLIC PANEL
ANION GAP: 3 — AB (ref 5–15)
BUN: 24 mg/dL — AB (ref 6–23)
CO2: 27 mmol/L (ref 19–32)
CREATININE: 0.67 mg/dL (ref 0.50–1.10)
Calcium: 8.5 mg/dL (ref 8.4–10.5)
Chloride: 108 mmol/L (ref 96–112)
GFR calc non Af Amer: 81 mL/min — ABNORMAL LOW (ref >90)
GLUCOSE: 109 mg/dL — AB (ref 70–99)
Potassium: 4 mmol/L (ref 3.5–5.1)
SODIUM: 138 mmol/L (ref 135–145)

## 2014-06-03 LAB — URINALYSIS, ROUTINE W REFLEX MICROSCOPIC
BILIRUBIN URINE: NEGATIVE
GLUCOSE, UA: NEGATIVE mg/dL
Ketones, ur: NEGATIVE mg/dL
NITRITE: NEGATIVE
PH: 6.5 (ref 5.0–8.0)
Protein, ur: 30 mg/dL — AB
Specific Gravity, Urine: 1.024 (ref 1.005–1.030)
Urobilinogen, UA: 0.2 mg/dL (ref 0.0–1.0)

## 2014-06-03 LAB — URINE MICROSCOPIC-ADD ON

## 2014-06-03 LAB — TROPONIN I: Troponin I: <0.03 ng/mL (ref <0.031)

## 2014-06-03 LAB — BRAIN NATRIURETIC PEPTIDE: B NATRIURETIC PEPTIDE 5: 703.1 pg/mL — AB (ref 0.0–100.0)

## 2014-06-03 MED ORDER — NICOTINE 21 MG/24HR TD PT24
21.0000 mg | MEDICATED_PATCH | Freq: Once | TRANSDERMAL | Status: AC
  Administered 2014-06-03: 21 mg via TRANSDERMAL
  Filled 2014-06-03: qty 1

## 2014-06-03 MED ORDER — FENTANYL CITRATE 0.05 MG/ML IJ SOLN
50.0000 ug | Freq: Once | INTRAMUSCULAR | Status: AC
  Administered 2014-06-03: 50 ug via INTRAVENOUS
  Filled 2014-06-03: qty 2

## 2014-06-03 MED ORDER — LORAZEPAM 2 MG/ML IJ SOLN
1.0000 mg | Freq: Once | INTRAMUSCULAR | Status: AC
  Administered 2014-06-03: 1 mg via INTRAVENOUS
  Filled 2014-06-03: qty 1

## 2014-06-03 MED ORDER — DILTIAZEM LOAD VIA INFUSION
10.0000 mg | Freq: Once | INTRAVENOUS | Status: AC
  Administered 2014-06-03: 10 mg via INTRAVENOUS
  Filled 2014-06-03: qty 10

## 2014-06-03 MED ORDER — DILTIAZEM HCL 100 MG IV SOLR
5.0000 mg/h | INTRAVENOUS | Status: DC
  Administered 2014-06-03: 5 mg/h via INTRAVENOUS
  Administered 2014-06-04 (×2): 15 mg/h via INTRAVENOUS
  Filled 2014-06-03 (×2): qty 100

## 2014-06-03 NOTE — Telephone Encounter (Signed)
Please contact pt and let her know that her MRI shows severe arthritis changes and degenerative discs in her lower back.  I would like for her to meet with Neurosurgery for further evaluation.

## 2014-06-03 NOTE — ED Notes (Signed)
Pt c/o lower back pain, MRI yesterday. Pt is SOB and wheezing.

## 2014-06-03 NOTE — ED Notes (Signed)
MD at bedside. 

## 2014-06-03 NOTE — ED Notes (Signed)
Pt repeating "I'm leaving at 7:30. I'm out of here". Encouraged pt to stay for treatment. Family at bedside with pt encouraging her to stay for treatment

## 2014-06-03 NOTE — ED Notes (Signed)
Pt taken to xray from triage

## 2014-06-03 NOTE — ED Notes (Signed)
Patient attempted a urine sample by bedpan, but unable.

## 2014-06-03 NOTE — ED Provider Notes (Signed)
CSN: [id number]     Arrival date & time 06/03/14  1729 History  This chart was scribed for Arbie Cookey, MD by Tula Nakayama, ED Scribe. This patient was seen in room MH06/MH06 and the patient's care was started at 6:27 PM.    Chief Complaint  Patient presents with  . Shortness of Breath   Patient is a 81 y.o. female presenting with shortness of breath. The history is provided by the patient and a relative. No language interpreter was used.  Shortness of Breath Severity:  Moderate Onset quality:  Gradual Duration:  3 days Timing:  Intermittent Progression:  Unchanged Chronicity:  New Context: weather changes   Relieved by:  Rest Worsened by:  Weather changes Ineffective treatments:  Oxygen Associated symptoms: wheezing   Associated symptoms: no fever   Risk factors: tobacco use    HPI Comments: Sarah [name] is a 81 y.o. female accompanied by her daughter, with a history of HTN,  hyperlipidemia and sleep apnea, who presents to the Emergency Department complaining of intermittent wheezing and SOB that started 3 days ago. She states symptoms become worse with cold weather, movement and standing up, but improve with rest. Pt lives with her daughter. She does not use oxygen at home. Pt denies a history of cardiac problems. Pt does not want to be admitted today and states she prefers to go home to be with her cats and have a cigarette.   Pt also complains of lower back pain that started a few months ago after she fell. She takes pain medication prescribed by her PCP and uses a heating pad with some relief. Pt had an MRI ordered by her PCP yeaterday. She denies weakness, numbness, tingling, bladder or bowel incontinence and fever as associated symptoms.    Past Medical History  Diagnosis Date  . Hypertension   . Thyroid disease     hyperthyroidism  . Hyperlipidemia   . Sleep apnea    Past Surgical History  Procedure Laterality Date  . Abdominal hysterectomy  1965  .  Hemorroidectomy  1957  . Breast lumpectomy  1966, 1971   Family History  Problem Relation Age of Onset  . Diabetes    . Hyperlipidemia Neg Hx   . Heart attack Neg Hx   . Hypertension Neg Hx   . Sudden death Neg Hx   . Colon cancer      grandson   History  Substance Use Topics  . Smoking status: Current Every Day Smoker -- 1.50 packs/day  . Smokeless tobacco: Never Used  . Alcohol Use: No   OB History    No data available     Review of Systems  Constitutional: Negative for fever.  Respiratory: Positive for shortness of breath and wheezing.   Musculoskeletal: Positive for back pain.  Neurological: Negative for weakness and numbness.  All other systems reviewed and are negative.  Allergies  Morphine and related  Home Medications   Prior to Admission medications   Medication Sig Start Date End Date Taking? Authorizing Provider  acetaminophen (TYLENOL) 500 MG tablet Take 500 mg by mouth 2 (two) times daily as needed.    Historical Provider, MD  alendronate (FOSAMAX) 70 MG tablet Take 1 tablet (70 mg total) by mouth every 7 (seven) days. Take with a full glass of water on an empty stomach. 11/21/13   Debbrah Alar, NP  amLODipine (NORVASC) 5 MG tablet TAKE 1 TABLET (5 MG TOTAL) BY MOUTH DAILY. 03/19/14   Melissa  Inda Castle, NP  aspirin 81 MG tablet Take 81 mg by mouth daily.      Historical Provider, MD  atorvastatin (LIPITOR) 40 MG tablet Take 1 tablet (40 mg total) by mouth daily. 05/16/14   Debbrah Alar, NP  bisacodyl (BISACODYL) 5 MG EC tablet Take 5 mg by mouth daily as needed.    Historical Provider, MD  Calcium Carbonate-Vitamin D (CALTRATE 600+D PO) Take 1 tablet by mouth 2 (two) times daily.    Historical Provider, MD  celecoxib (CELEBREX) 100 MG capsule Take 1 capsule (100 mg total) by mouth daily. 05/08/14   Brunetta Jeans, PA-C  Cholecalciferol (VITAMIN D3) 2000 UNITS TABS Take 1 tablet by mouth daily.      Historical Provider, MD  diphenhydrAMINE  (BENADRYL) 25 MG tablet Take 25 mg by mouth at bedtime as needed for sleep.    Historical Provider, MD  glucosamine-chondroitin 500-400 MG tablet Take 1 tablet by mouth 2 (two) times daily.    Historical Provider, MD  methylPREDNISolone (MEDROL DOSEPAK) 4 MG tablet follow package directions 05/15/14   Debbrah Alar, NP  mirabegron ER (MYRBETRIQ) 50 MG TB24 Take 50 mg by mouth daily.    Historical Provider, MD  polycarbophil (FIBERCON) 625 MG tablet Take 625 mg by mouth daily.      Historical Provider, MD  traMADol (ULTRAM) 50 MG tablet Take 1 tablet (50 mg total) by mouth every 8 (eight) hours as needed. 05/24/14   Debbrah Alar, NP   BP 109/60 mmHg  Pulse 133  Temp(Src) 98 F (36.7 C) (Oral)  Resp 18  SpO2 93% Physical Exam  Constitutional: She is oriented to person, place, and time. She appears well-developed and well-nourished. No distress.  HENT:  Head: Normocephalic and atraumatic.  Mouth/Throat: Oropharynx is clear and moist.  Eyes: Conjunctivae are normal. Pupils are equal, round, and reactive to light. No scleral icterus.  Neck: Neck supple.  Cardiovascular: Normal heart sounds and intact distal pulses.  An irregularly irregular rhythm present. Tachycardia present.   No murmur heard. Pulmonary/Chest: Effort normal. No stridor. Tachypnea noted. No respiratory distress. She has rales (bibasilar, worse on left).  Abdominal: Soft. Bowel sounds are normal. She exhibits no distension. There is no tenderness.  Musculoskeletal: Normal range of motion.       Back:  Neurological: She is alert and oriented to person, place, and time.  Normal lower extremity strength bilaterally.  Skin: Skin is warm and dry. No rash noted.  Psychiatric: She has a normal mood and affect. Her behavior is normal.  Nursing note and vitals reviewed.   ED Course  CRITICAL CARE Performed by: Cyd Silence DAVID Authorized by: Cyd Silence DAVID Total critical care time: 40  minutes Critical care time was exclusive of separately billable procedures and treating other patients. Critical care was necessary to treat or prevent imminent or life-threatening deterioration of the following conditions: circulatory failure and cardiac failure. Critical care was time spent personally by me on the following activities: development of treatment plan with patient or surrogate, discussions with consultants, evaluation of patient's response to treatment, examination of patient, obtaining history from patient or surrogate, ordering and performing treatments and interventions, ordering and review of laboratory studies, ordering and review of radiographic studies, pulse oximetry, re-evaluation of patient's condition and review of old charts.   (including critical care time) DIAGNOSTIC STUDIES: Oxygen Saturation is 93% on [place], low by my interpretation.    COORDINATION OF CARE: 6:43 PM Discussed x-ray results and treatment plan  with pt at bedside. She agreed to plan.  Labs Review Labs Reviewed  CBC WITH DIFFERENTIAL/PLATELET - Abnormal; Notable for the following:    RBC 3.59 (*)    Hemoglobin 11.7 (*)    MCV 100.6 (*)    All other components within normal limits  BASIC METABOLIC PANEL - Abnormal; Notable for the following:    Glucose, Bld 109 (*)    BUN 24 (*)    GFR calc non Af Amer 81 (*)    Anion gap 3 (*)    All other components within normal limits  BRAIN NATRIURETIC PEPTIDE - Abnormal; Notable for the following:    B Natriuretic Peptide 703.1 (*)    All other components within normal limits  TROPONIN I  URINALYSIS, ROUTINE W REFLEX MICROSCOPIC    Imaging Review Dg Chest 2 View  06/03/2014   CLINICAL DATA:  Acute onset of shortness of breath and wheezing. Lower back pain. Initial encounter.  EXAM: CHEST  2 VIEW  COMPARISON:  Chest radiograph performed 12/07/2011  FINDINGS: Bibasilar airspace opacities likely reflect pulmonary edema, though pneumonia might have a  similar appearance. Underlying vascular congestion is noted. No definite pleural effusion or pneumothorax is seen.  The heart is borderline normal in size. No acute osseous abnormalities are identified. A metallic device is noted overlying the upper abdomen. There is mild degenerative change at the upper lumbar spine.  IMPRESSION: Bibasilar airspace opacities likely reflect pulmonary edema, though pneumonia might have a similar appearance. Underlying vascular congestion noted.   Electronically Signed   By: Garald Balding M.D.   On: 06/03/2014 18:04   All radiology studies independently viewed by me.       EKG Interpretation   Date/Time:  Sunday June 03 2014 18:08:11 EST Ventricular Rate:  132 PR Interval:    QRS Duration: 110 QT Interval:  326 QTC Calculation: 483 R Axis:   90 Text Interpretation:  Atrial fibrillation with rapid ventricular response  Rightward axis Low voltage QRS Cannot rule out Anteroseptal infarct , age  undetermined Abnormal ECG No old tracing to compare Confirmed by [facility]   MD, TREY ([id number]) on 06/03/2014 7:13:29 PM      MDM   Final diagnoses:  Atrial fibrillation with RVR    81 year old female who presents with left low back pain, which she has been evaluated for by her primary doctor., On arrival, found to be short of breath and tachycardic. EKG shows A. Fib with RVR. Treated with diltiazem bolus and drip. She has evidence of congestive heart failure, felt to be secondary to her tachycardia.He will be admitted by cardiology. Transfer to [employer].   I personally performed the services described in this documentation, which was scribed in my presence. The recorded information has been reviewed and is accurate.      Arbie Cookey, MD 06/03/14 [phone]

## 2014-06-04 ENCOUNTER — Encounter (HOSPITAL_COMMUNITY): Payer: Self-pay | Admitting: *Deleted

## 2014-06-04 ENCOUNTER — Observation Stay (HOSPITAL_COMMUNITY): Payer: Medicare Other | Admitting: Certified Registered"

## 2014-06-04 ENCOUNTER — Telehealth: Payer: Self-pay | Admitting: *Deleted

## 2014-06-04 ENCOUNTER — Encounter (HOSPITAL_COMMUNITY)
Admission: EM | Disposition: A | Discharge status: Home / Self Care | Payer: Self-pay | Source: Home / Self Care | Attending: Cardiology

## 2014-06-04 DIAGNOSIS — I4891 Unspecified atrial fibrillation: Secondary | ICD-10-CM | POA: Diagnosis not present

## 2014-06-04 DIAGNOSIS — I5031 Acute diastolic (congestive) heart failure: Secondary | ICD-10-CM | POA: Diagnosis not present

## 2014-06-04 DIAGNOSIS — I10 Essential (primary) hypertension: Secondary | ICD-10-CM

## 2014-06-04 DIAGNOSIS — I340 Nonrheumatic mitral (valve) insufficiency: Secondary | ICD-10-CM | POA: Diagnosis not present

## 2014-06-04 DIAGNOSIS — Z720 Tobacco use: Secondary | ICD-10-CM

## 2014-06-04 HISTORY — PX: TEE WITHOUT CARDIOVERSION: SHX5443

## 2014-06-04 HISTORY — PX: CARDIOVERSION: SHX1299

## 2014-06-04 LAB — CBC WITH DIFFERENTIAL/PLATELET
BASOS ABS: 0 10*3/uL (ref 0.0–0.1)
Basophils Relative: 1 % (ref 0–1)
EOS PCT: 3 % (ref 0–5)
Eosinophils Absolute: 0.2 10*3/uL (ref 0.0–0.7)
HCT: 31.9 % — ABNORMAL LOW (ref 36.0–46.0)
HEMOGLOBIN: 10.5 g/dL — AB (ref 12.0–15.0)
Lymphocytes Relative: 30 % (ref 12–46)
Lymphs Abs: 2.3 10*3/uL (ref 0.7–4.0)
MCH: 32.2 pg (ref 26.0–34.0)
MCHC: 32.9 g/dL (ref 30.0–36.0)
MCV: 97.9 fL (ref 78.0–100.0)
Monocytes Absolute: 0.8 10*3/uL (ref 0.1–1.0)
Monocytes Relative: 10 % (ref 3–12)
NEUTROS ABS: 4.3 10*3/uL (ref 1.7–7.7)
NEUTROS PCT: 56 % (ref 43–77)
Platelets: 243 10*3/uL (ref 150–400)
RBC: 3.26 MIL/uL — AB (ref 3.87–5.11)
RDW: 14.9 % (ref 11.5–15.5)
WBC: 7.6 10*3/uL (ref 4.0–10.5)

## 2014-06-04 LAB — BASIC METABOLIC PANEL
ANION GAP: 6 (ref 5–15)
BUN: 16 mg/dL (ref 6–23)
CO2: 26 mmol/L (ref 19–32)
CREATININE: 0.64 mg/dL (ref 0.50–1.10)
Calcium: 8.3 mg/dL — ABNORMAL LOW (ref 8.4–10.5)
Chloride: 108 mmol/L (ref 96–112)
GFR calc Af Amer: >90 mL/min (ref >90)
GFR calc non Af Amer: 82 mL/min — ABNORMAL LOW (ref >90)
Glucose, Bld: 106 mg/dL — ABNORMAL HIGH (ref 70–99)
Potassium: 3.8 mmol/L (ref 3.5–5.1)
SODIUM: 140 mmol/L (ref 135–145)

## 2014-06-04 LAB — TROPONIN I: Troponin I: <0.03 ng/mL (ref <0.031)

## 2014-06-04 LAB — MAGNESIUM: Magnesium: 2 mg/dL (ref 1.5–2.5)

## 2014-06-04 SURGERY — ECHOCARDIOGRAM, TRANSESOPHAGEAL
Anesthesia: Monitor Anesthesia Care

## 2014-06-04 MED ORDER — FUROSEMIDE 10 MG/ML IJ SOLN
60.0000 mg | Freq: Two times a day (BID) | INTRAMUSCULAR | Status: DC
  Administered 2014-06-05 – 2014-06-06 (×3): 60 mg via INTRAVENOUS
  Filled 2014-06-04 (×6): qty 6

## 2014-06-04 MED ORDER — DOCUSATE SODIUM 100 MG PO CAPS
100.0000 mg | ORAL_CAPSULE | Freq: Every day | ORAL | Status: DC
  Administered 2014-06-04 – 2014-06-06 (×3): 100 mg via ORAL
  Filled 2014-06-04 (×3): qty 1

## 2014-06-04 MED ORDER — AMLODIPINE BESYLATE 5 MG PO TABS: 5.0000 mg | ORAL_TABLET | Freq: Every day | ORAL | Status: DC

## 2014-06-04 MED ORDER — DIPHENHYDRAMINE HCL 25 MG PO CAPS
25.0000 mg | ORAL_CAPSULE | Freq: Every evening | ORAL | Status: DC | PRN
  Administered 2014-06-04 – 2014-06-05 (×3): 25 mg via ORAL
  Filled 2014-06-04 (×3): qty 1

## 2014-06-04 MED ORDER — SODIUM CHLORIDE 0.9 % IV SOLN
INTRAVENOUS | Status: DC
  Administered 2014-06-04: 15:00:00 via INTRAVENOUS

## 2014-06-04 MED ORDER — ENOXAPARIN SODIUM 60 MG/0.6ML ~~LOC~~ SOLN
60.0000 mg | Freq: Two times a day (BID) | SUBCUTANEOUS | Status: DC
  Administered 2014-06-04 (×2): 60 mg via SUBCUTANEOUS
  Filled 2014-06-04 (×3): qty 0.6

## 2014-06-04 MED ORDER — APIXABAN 2.5 MG PO TABS
2.5000 mg | ORAL_TABLET | Freq: Two times a day (BID) | ORAL | Status: DC
  Administered 2014-06-04: 2.5 mg via ORAL
  Filled 2014-06-04 (×2): qty 1

## 2014-06-04 MED ORDER — PROPOFOL INFUSION 10 MG/ML OPTIME
INTRAVENOUS | Status: DC | PRN
  Administered 2014-06-04: 100 ug/kg/min via INTRAVENOUS

## 2014-06-04 MED ORDER — BUTAMBEN-TETRACAINE-BENZOCAINE 2-2-14 % EX AERO
INHALATION_SPRAY | CUTANEOUS | Status: DC | PRN
  Administered 2014-06-04: 2 via TOPICAL

## 2014-06-04 MED ORDER — ATORVASTATIN CALCIUM 40 MG PO TABS
40.0000 mg | ORAL_TABLET | Freq: Every day | ORAL | Status: DC
  Administered 2014-06-04 – 2014-06-06 (×3): 40 mg via ORAL
  Filled 2014-06-04 (×3): qty 1

## 2014-06-04 MED ORDER — TRAMADOL HCL 50 MG PO TABS
50.0000 mg | ORAL_TABLET | Freq: Three times a day (TID) | ORAL | Status: DC | PRN
  Administered 2014-06-04 – 2014-06-06 (×5): 50 mg via ORAL
  Filled 2014-06-04 (×5): qty 1

## 2014-06-04 MED ORDER — FUROSEMIDE 10 MG/ML IJ SOLN
40.0000 mg | Freq: Once | INTRAMUSCULAR | Status: AC
  Administered 2014-06-04: 40 mg via INTRAVENOUS
  Filled 2014-06-04: qty 4

## 2014-06-04 MED ORDER — LACTATED RINGERS IV SOLN
INTRAVENOUS | Status: DC | PRN
  Administered 2014-06-04: 12:00:00 via INTRAVENOUS

## 2014-06-04 MED ORDER — APIXABAN 2.5 MG PO TABS
2.5000 mg | ORAL_TABLET | Freq: Two times a day (BID) | ORAL | Status: DC
  Administered 2014-06-04 – 2014-06-06 (×4): 2.5 mg via ORAL
  Filled 2014-06-04 (×5): qty 1

## 2014-06-04 MED ORDER — ACETAMINOPHEN 500 MG PO TABS
500.0000 mg | ORAL_TABLET | Freq: Two times a day (BID) | ORAL | Status: DC | PRN
  Administered 2014-06-04: 500 mg via ORAL
  Filled 2014-06-04: qty 1

## 2014-06-04 MED ORDER — DILTIAZEM HCL 30 MG PO TABS
30.0000 mg | ORAL_TABLET | Freq: Four times a day (QID) | ORAL | Status: DC
Start: 2014-06-04 — End: 2014-06-05
  Administered 2014-06-05 (×2): 30 mg via ORAL
  Filled 2014-06-04 (×6): qty 1

## 2014-06-04 MED ORDER — PROPOFOL 10 MG/ML IV BOLUS
INTRAVENOUS | Status: DC | PRN
  Administered 2014-06-04: 10 mg via INTRAVENOUS
  Administered 2014-06-04: 20 mg via INTRAVENOUS
  Administered 2014-06-04: 10 mg via INTRAVENOUS

## 2014-06-04 NOTE — Anesthesia Postprocedure Evaluation (Signed)
  Anesthesia Post-op Note  Patient: Sarah [name]  Procedure(s) Performed: Procedure(s): TRANSESOPHAGEAL ECHOCARDIOGRAM (TEE) (N/A) CARDIOVERSION (N/A)  Patient Location: PACU  Anesthesia Type:MAC  Level of Consciousness: awake and alert   Airway and Oxygen Therapy: Patient Spontanous Breathing  Post-op Pain: none  Post-op Assessment: Post-op Vital signs reviewed  Post-op Vital Signs: Reviewed  Last Vitals:  Filed Vitals:   06/04/14 1231  BP: 92/57  Pulse: 60  Temp:   Resp: 26    Complications: No apparent anesthesia complications

## 2014-06-04 NOTE — Interval H&P Note (Signed)
History and Physical Interval Note:  06/04/2014 11:47 AM  Sarah [name]  has presented today for surgery, with the diagnosis of a fib  The various methods of treatment have been discussed with the patient and family. After consideration of risks, benefits and other options for treatment, the patient has consented to  Procedure(s): TRANSESOPHAGEAL ECHOCARDIOGRAM (TEE) (N/A) CARDIOVERSION (N/A) as a surgical intervention .  The patient's history has been reviewed, patient examined, no change in status, stable for surgery.  I have reviewed the patient's chart and labs.  Questions were answered to the patient's satisfaction.     [name], Wonda Cheng

## 2014-06-04 NOTE — H&P (View-Only) (Signed)
   Had long discussion with the patient and her daughter.  The patient will stay for TEE/CV and wants to leave this afternoon after the procedure.  I explained that may not be possible.  The daughter assures me that the patient will not leave AMA.  We discussed longterm anticoagulation with Eliquis vs. Coumadin.  THey prefer eliquis.  She fell once a while back but it was a fluke occurrence and falling has not been an issue.  No other bleeding issues.    She is not willing to stop smoking.   On schedule for TEE/CV.   Jettie Booze, MD  06/04/2014  8:57 AM

## 2014-06-04 NOTE — Progress Notes (Signed)
ANTICOAGULATION CONSULT NOTE - Initial Consult  Pharmacy Consult for Lovenox Indication: atrial fibrillation  Allergies  Allergen Reactions  . Morphine And Related Nausea And Vomiting    Patient Measurements: Height: 5\' 2"  (157.5 cm) Weight: 125 lb 14.1 oz (57.1 kg) IBW/kg (Calculated) : 50.1  Vital Signs: Temp: 98.2 F (36.8 C) (01/31 2329) Temp Source: Oral (01/31 2329) BP: 108/56 mmHg (01/31 2329) Pulse Rate: 104 (01/31 2329)  Labs:  Recent Labs  06/03/14 1820  HGB 11.7*  HCT 36.1  PLT 272  CREATININE 0.67  TROPONINI <0.03    Estimated Creatinine Clearance: 44.4 mL/min (by C-G formula based on Cr of 0.67).   Medical History: Past Medical History  Diagnosis Date  . Hypertension   . Thyroid disease     hyperthyroidism  . Hyperlipidemia   . Sleep apnea     Medications:  Prescriptions prior to admission  Medication Sig Dispense Refill Last Dose  . Black Cohosh 540 MG CAPS Take 540 mg by mouth daily.   06/02/14 at 1800  . docusate sodium (COLACE) 100 MG capsule Take 100 mg by mouth daily.   06/03/2014 at 0900  . acetaminophen (TYLENOL) 500 MG tablet Take 500 mg by mouth 2 (two) times daily as needed.   Taking  . alendronate (FOSAMAX) 70 MG tablet Take 1 tablet (70 mg total) by mouth every 7 (seven) days. Take with a full glass of water on an empty stomach. 4 tablet 11 05/28/2014  . amLODipine (NORVASC) 5 MG tablet TAKE 1 TABLET (5 MG TOTAL) BY MOUTH DAILY. 30 tablet 2 06/03/2014 at 0900  . aspirin 81 MG tablet Take 81 mg by mouth daily.     06/03/2014 at 0900  . atorvastatin (LIPITOR) 40 MG tablet Take 1 tablet (40 mg total) by mouth daily. 30 tablet 3 06/03/2014 at 0900  . bisacodyl (BISACODYL) 5 MG EC tablet Take 5 mg by mouth daily as needed.   Taking  . Calcium Carbonate-Vitamin D (CALTRATE 600+D PO) Take 1 tablet by mouth 2 (two) times daily.   Taking  . celecoxib (CELEBREX) 100 MG capsule Take 1 capsule (100 mg total) by mouth daily. 15 capsule 0 Taking  .  Cholecalciferol (VITAMIN D3) 2000 UNITS TABS Take 1 tablet by mouth daily.     06/02/2014 at 1800  . diphenhydrAMINE (BENADRYL) 25 MG tablet Take 25 mg by mouth at bedtime as needed for sleep.   Taking  . glucosamine-chondroitin 500-400 MG tablet Take 1 tablet by mouth 2 (two) times daily.   06/03/2014 at 0900  . methylPREDNISolone (MEDROL DOSEPAK) 4 MG tablet follow package directions 21 tablet 0   . mirabegron ER (MYRBETRIQ) 50 MG TB24 Take 50 mg by mouth daily.   Taking  . polycarbophil (FIBERCON) 625 MG tablet Take 625 mg by mouth daily.     06/03/2014 at 0900  . traMADol (ULTRAM) 50 MG tablet Take 1 tablet (50 mg total) by mouth every 8 (eight) hours as needed. 20 tablet 0 06/03/2014 at 0900   Scheduled:  . nicotine  21 mg Transdermal Once   Infusions:  . diltiazem (CARDIZEM) infusion 15 mg/hr (06/04/14 0044)    Assessment: 81yo female c/o back pain, SOB, and wheezing, found to be in Afib w/ RVR, to begin Lovenox.  Goal of Therapy:  Anti-Xa level 0.6-1 units/ml 4hrs after LMWH dose given Monitor platelets by anticoagulation protocol: Yes   Plan:  Will begin Lovenox 60mg  SQ Q12H and monitor CBC; will f/u regarding ?long-term anticoag.  Wynona Neat, PharmD, BCPS  06/04/2014,12:55 AM

## 2014-06-04 NOTE — Care Management Note (Signed)
    Page 1 of 1   06/06/2014     1:51:17 PM CARE MANAGEMENT NOTE 06/06/2014  Patient:  Sarah [name], Sarah [name]   Account Number:  [id number]  Date Initiated:  06/04/2014  Documentation initiated by:  Mace Weinberg  Subjective/Objective Assessment:   pt adm on 06/03/14 with Afib.  PTA, pt resides at home with daughter.     Action/Plan:   Will follow for dc needs as pt progresses.   Anticipated DC Date:  06/05/2014   Anticipated DC Plan:  [place]  CM consult  Medication Assistance      Choice offered to / List presented to:             Status of service:  Completed, signed off Medicare Important Message given?  YES (If response is "NO", the following Medicare IM given date fields will be blank) Date Medicare IM given:  06/06/2014 Medicare IM given by:   Date Additional Medicare IM given:   Additional Medicare IM given by:    Discharge Disposition:    Per UR Regulation:  Reviewed for med. necessity/level of care/duration of stay  If discussed at [place] of Stay Meetings, dates discussed:    Comments:  06/06/14 Ellan Lambert, RN, BSN [phone] IM given by admission staff today.  Pt for dc; informed family of $45 copay for Eliquis.  Pt qualifies for O2, but she does not want it.  MD discussed this with her and her son, and she refuses O2 set up.  O2 sat dropped only briefly to 88, per Rn, and then returned to the 90s.  06/05/14 Ellan Lambert, RN, BSN [phone] Pt started on Eliquis.  Free trial card given to daughter. Copay information as follows:  PT COPAY WILL BE $45- PRIOR AUTH IS REQUIRED [phone]  MD/PA; please complete prior auth prior to dc.  Thanks.

## 2014-06-04 NOTE — Transfer of Care (Signed)
Immediate Anesthesia Transfer of Care Note  Patient: Sarah [name]  Procedure(s) Performed: Procedure(s): TRANSESOPHAGEAL ECHOCARDIOGRAM (TEE) (N/A) CARDIOVERSION (N/A)  Patient Location: Endoscopy Unit  Anesthesia Type:MAC  Level of Consciousness: awake  Airway & Oxygen Therapy: Patient Spontanous Breathing and Patient connected to nasal cannula oxygen  Post-op Assessment: Report given to RN, Post -op Vital signs reviewed and stable and Patient moving all extremities  Post vital signs: Reviewed and stable  Last Vitals:  Filed Vitals:   06/04/14 1212  BP: 95/46  Pulse: 25  Temp:   Resp: 25    Complications: No apparent anesthesia complications

## 2014-06-04 NOTE — Telephone Encounter (Signed)
Noted  

## 2014-06-04 NOTE — Plan of Care (Signed)
Problem: Phase I Progression Outcomes Goal: Ventricular heart rate < 120/min Outcome: Progressing Pt current HR between 80-120 on cardizem gtt 48m/hr Goal: Anticoagulation Therapy per MD order Outcome: Completed/Met Date Met:  06/04/14 Pt on lovenox Goal: Pain controlled with appropriate interventions Outcome: Progressing Pt on Tramadol for back pain with allergy to metoprolol, will continue to monitor. RRonnette Hila RN

## 2014-06-04 NOTE — Anesthesia Preprocedure Evaluation (Addendum)
Anesthesia Evaluation  Patient identified by MRN, date of birth, ID band Patient awake    Reviewed: Allergy & Precautions, NPO status , Patient's Chart, lab work & pertinent test results  Airway Mallampati: II  TM Distance: >3 FB Neck ROM: Full    Dental  (+) Dental Advisory Given, Chipped, Poor Dentition   Pulmonary sleep apnea , Current Smoker,          Cardiovascular hypertension, Pt. on medications + dysrhythmias     Neuro/Psych negative neurological ROS     GI/Hepatic negative GI ROS, Neg liver ROS,   Endo/Other  negative endocrine ROS  Renal/GU negative Renal ROS     Musculoskeletal   Abdominal   Peds  Hematology negative hematology ROS (+)   Anesthesia Other Findings   Reproductive/Obstetrics                            Anesthesia Physical Anesthesia Plan  ASA: III  Anesthesia Plan: MAC   Post-op Pain Management:    Induction: Intravenous  Airway Management Planned: Natural Airway and Nasal Cannula  Additional Equipment:   Intra-op Plan:   Post-operative Plan:   Informed Consent: I have reviewed the patients History and Physical, chart, labs and discussed the procedure including the risks, benefits and alternatives for the proposed anesthesia with the patient or authorized representative who has indicated his/her understanding and acceptance.   Dental advisory given  Plan Discussed with: CRNA, Anesthesiologist and Surgeon  Anesthesia Plan Comments:        Anesthesia Quick Evaluation

## 2014-06-04 NOTE — Telephone Encounter (Signed)
Received call from pt's daughter. She states pt is currently in the hospital. She is being treated for a-fib, fluid on lungs and COPD. Pt has had heart shocked twice to try and get it in rhythm without success. States they are wanting to do surgery to remove the fluid on her lungs and pt is refusing and is wanting to leave the hospital. Explained referral to neurosurgery is for consultation first and they will determine treatment plan for pt. Sarah [name] states she may bring FMLA forms for [place] to complete for her due to all of her mom's medical conditions as she is having to take pt to her appts.

## 2014-06-04 NOTE — Progress Notes (Signed)
Patient is wanting to go home. She stated that she will not stay here another night. She only came in for low back pain and left arm pain related to a previous fall. She does not want to stay here to get treatment for her Afib. Md came in this am to assess patient. Pt voiced her concerns about not wanting to stay at the hospital for treatment. Patient siad that she has had bad experiences in the hospital . Daughter states that patient hasn't been in the hospital in 40 years. Daughter brought patient to the ER because of her wheezing and SOB. Doctor will be back in to talk to patient and daughter.

## 2014-06-04 NOTE — Progress Notes (Signed)
Pt arrived to floor in NAD, A/Ox4. Pt on cardizem gtt at 15mg /hr. Pt HR between 90-120. Monitor tech notified. Admitting MD paged and arrived to bedside. Pt requesting pain medicine and sleep medicine. Pt educated on how to call RN and call bell within reach. Pt daughter to bedside. Will continue to monitor. Ronnette Hila, RN

## 2014-06-04 NOTE — H&P (Addendum)
Admission History and Physical     Patient ID: GWENN TEODORO, MRN: [id number], DOB: Dec 10, 1931 81 y.o. Date of Encounter: 06/04/2014, 1:20 AM  Primary Physician: Nance Pear., NP  Chief Complaint:  Short of breath  History of Present Illness: DHAMAR GREGORY is a 81 y.o. female with hypertension, hyperlipidemia, active smoker with 60+ pack year smoking history, who presented to the [facility] for shortness of breath, and was found to be in AFib with RVR.  No prior cardiac history.  Significant back pain with recent fall and increase in pain, not being well controlled on her current pain regimen.   She thinks she has been more SOB for the past 3 days.  Does not notice palpitations or chest pain.  Does not want to stay in hospital any longer than one night.  She lives at home with her daughter who is here with her today.  At the [facility] urgent care, she was started on diltiazem gtt, with good rate control.    Past Medical History  Diagnosis Date  . Hypertension   . Thyroid disease     hyperthyroidism  . Hyperlipidemia   . Sleep apnea      Past Surgical History  Procedure Laterality Date  . Abdominal hysterectomy  1965  . Hemorroidectomy  1957  . Breast lumpectomy  1966, 1971      Current Facility-Administered Medications  Medication Dose Route Frequency Provider Last Rate Last Dose  . acetaminophen (TYLENOL) tablet 500 mg  500 mg Oral BID PRN Stephani Police, MD      . atorvastatin (LIPITOR) tablet 40 mg  40 mg Oral Daily Stephani Police, MD      . diltiazem (CARDIZEM) 100 mg in dextrose 5 % 100 mL (1 mg/mL) infusion  5-15 mg/hr Intravenous Continuous Houston Siren III, MD 15 mL/hr at 06/04/14 0044 15 mg/hr at 06/04/14 0044  . diphenhydrAMINE (BENADRYL) capsule 25 mg  25 mg Oral QHS PRN Stephani Police, MD      . docusate sodium (COLACE) capsule 100 mg  100 mg Oral Daily Stephani Police, MD      . enoxaparin (LOVENOX) injection 60 mg  60 mg  Subcutaneous Q12H Rogue Bussing, [facility]      . nicotine (NICODERM CQ - dosed in mg/24 hours) patch 21 mg  21 mg Transdermal Once Houston Siren III, MD   21 mg at 06/03/14 1909  . traMADol (ULTRAM) tablet 50 mg  50 mg Oral Q8H PRN Stephani Police, MD          Allergies: Allergies  Allergen Reactions  . Morphine And Related Nausea And Vomiting     Social History:  The patient  reports that she has been smoking.  She has never used smokeless tobacco. She reports that she does not drink alcohol.   Family History:  The patient's family history includes Colon cancer in an other family member; Diabetes in an other family member. There is no history of Hyperlipidemia, Heart attack, Hypertension, or Sudden death.   ROS:  Please see the history of present illness. +lower back pain All other systems reviewed and negative.   Vital Signs: Blood pressure 108/56, pulse 104, temperature 98.2 F (36.8 C), temperature source Oral, resp. rate 20, height 5\' 2"  (1.575 m), weight 57.1 kg (125 lb 14.1 oz), SpO2 97 %.  PHYSICAL EXAM: General:  No acute distress HEENT: normal Lymph: no adenopathy Neck: no JVD Endocrine:  No thryomegaly Cardiac:  Irregularly  irregular without murmur. Lungs:  Scattered wheezing throughout. Abd: soft, nontender, no hepatomegaly Ext: no edema Musculoskeletal:  No deformities, BUE and BLE strength normal and equal Skin: warm and dry Neuro:  CNs 2-12 intact, no focal abnormalities noted Psych:  Normal affect   EKG:   A-fib with RVR, LBBB.  Labs:   Lab Results  Component Value Date   WBC 9.2 06/03/2014   HGB 11.7* 06/03/2014   HCT 36.1 06/03/2014   MCV 100.6* 06/03/2014   PLT 272 06/03/2014    Recent Labs Lab 06/03/14 1820  NA 138  K 4.0  CL 108  CO2 27  BUN 24*  CREATININE 0.67  CALCIUM 8.5  GLUCOSE 109*    Recent Labs  06/03/14 1820  TROPONINI <0.03   Lab Results  Component Value Date   CHOL 224* 05/15/2014   HDL 80.30 05/15/2014    LDLCALC 124* 05/15/2014   TRIG 98.0 05/15/2014   No results found for: DDIMER  Radiology/Studies:  CXR with mild pulmonary edema    ASSESSMENT AND PLAN:   1. AFib with RVR - New onset, and by symptoms has been going on for about 3 days - Continue rate control on diltiazem gtt - Anticoagulation with lovenox  - TEE cardioversion tomorrow    2. CHF - She has some pulmonary edema on CXR and BNP is elevated - She is stable on exam and vitals.  Will monitor for improvement with better rate control prior to starting lasix. - TTE vs. going straight to TEE tomorrow. - trend troponins given multiple risk factors for CAD.     Signed,  Stephani Police, MD 06/04/2014, 1:20 AM     I have examined the patient and reviewed assessment and plan and discussed with patient.  Agree with above as stated.    Had a long conversation with the patient. She does not want to be in the hospital. She has had bad experiences in the past in hospitals. She definitely wants to go home. I briefly discussed cardioversion with her. She insists that she would have to go home after that. She will not spend another night in the hospital. She feels that she is breathing better. She does not wear oxygen at home, but is requiring oxygen in the hospital.  Blood pressure stable as above. Heart rate running in the 90s to 110 range. Heart is irregularly irregular No wheezing Abdomen soft nontender Trace edema in bilateral lower extremities   A/P:  Given that she is an 81 year old woman with hypertension, her chads vasc score is 3; she would significantly decrease her stroke risk with anticoagulation.  It is not unreasonable to consider TEE cardioversion to try to keep her out of presumed diastolic heart failure. Would like to get some assessment of her ejection fraction, either with transthoracic echo or transesophageal echocardiogram. The patient at this point states she wants to leave the hospital by noon today.  The  daughter left the room but will be back shortly. I will talk to her about our recommendations for stroke prevention and a strategy of either rate control versus rhythm control. I assured the patient that we would not force her to do anything she did not want to do. We will make our recommendations and she can agree or disagree with whatever evaluation she wants.  The 2 reasons she points out for not wanting to be in the hospital is that she has had bad experiences in the past and secondly, her cats have a checkup  at the vet tomorrow.  She needs to stop smoking.  Shasha Buchbinder S.

## 2014-06-04 NOTE — Progress Notes (Signed)
   Had long discussion with the patient and her daughter.  The patient will stay for TEE/CV and wants to leave this afternoon after the procedure.  I explained that may not be possible.  The daughter assures me that the patient will not leave AMA.  We discussed longterm anticoagulation with Eliquis vs. Coumadin.  THey prefer eliquis.  She fell once a while back but it was a fluke occurrence and falling has not been an issue.  No other bleeding issues.    She is not willing to stop smoking.   On schedule for TEE/CV.   Jettie Booze, MD  06/04/2014  8:57 AM

## 2014-06-04 NOTE — Progress Notes (Signed)
  Echocardiogram Echocardiogram Transesophageal has been performed.  Philipp Deputy 06/04/2014, 12:18 PM

## 2014-06-04 NOTE — Telephone Encounter (Signed)
Left detailed message on home# and to call if any questions. 

## 2014-06-04 NOTE — Progress Notes (Signed)
UR completed 

## 2014-06-04 NOTE — CV Procedure (Signed)
    Transesophageal Echocardiogram Note  Sarah [name] [id number] [birth year]  Procedure: Transesophageal Echocardiogram Indications: atrial fib  Procedure Details Consent: Obtained Time Out: Verified patient identification, verified procedure, site/side was marked, verified correct patient position, special equipment/implants available, Radiology Safety Procedures followed,  medications/allergies/relevent history reviewed, required imaging and test results available.  Performed  Medications: Fentanyl:  Versed:  Propofol 105 mg total for TEE and cardioversion  Left Ventrical:  Mild - mod LV dysfunction. EF ~ 45%  Mitral Valve: mild MR   Aortic Valve: normal AV  Tricuspid Valve: mild - moderate TR   Pulmonic Valve: no significant PI  Left Atrium/ Left atrial appendage: no thrombus   Atrial septum: no PFO by color flow   Aorta: calcified   + pleural effusion   Complications: No apparent complications Patient did tolerate procedure well.     Cardioversion Note  Sarah [name] [id number] [birth year]  Procedure: DC Cardioversion Indications: atrial fib  Procedure Details Consent: Obtained Time Out: Verified patient identification, verified procedure, site/side was marked, verified correct patient position, special equipment/implants available, Radiology Safety Procedures followed,  medications/allergies/relevent history reviewed, required imaging and test results available.  Performed  The patient has been on adequate anticoagulation.  The patient received IV propofol  for sedation.  Synchronous cardioversion was performed at 120, 200  joules.  The cardioversion was unsuccessful .    Complications: No apparent complications Patient did tolerate procedure well.  Will start Eliquis   Ramond Dial., MD, [facility] 06/04/2014, 12:06 PM

## 2014-06-05 ENCOUNTER — Encounter (HOSPITAL_COMMUNITY): Payer: Self-pay | Admitting: Cardiovascular Disease

## 2014-06-05 DIAGNOSIS — I519 Heart disease, unspecified: Secondary | ICD-10-CM | POA: Diagnosis not present

## 2014-06-05 DIAGNOSIS — D649 Anemia, unspecified: Secondary | ICD-10-CM

## 2014-06-05 DIAGNOSIS — E876 Hypokalemia: Secondary | ICD-10-CM | POA: Diagnosis not present

## 2014-06-05 DIAGNOSIS — R312 Other microscopic hematuria: Secondary | ICD-10-CM | POA: Diagnosis present

## 2014-06-05 DIAGNOSIS — I447 Left bundle-branch block, unspecified: Secondary | ICD-10-CM | POA: Diagnosis present

## 2014-06-05 DIAGNOSIS — I429 Cardiomyopathy, unspecified: Secondary | ICD-10-CM | POA: Diagnosis present

## 2014-06-05 DIAGNOSIS — F17210 Nicotine dependence, cigarettes, uncomplicated: Secondary | ICD-10-CM | POA: Diagnosis present

## 2014-06-05 DIAGNOSIS — I5041 Acute combined systolic (congestive) and diastolic (congestive) heart failure: Secondary | ICD-10-CM | POA: Diagnosis not present

## 2014-06-05 DIAGNOSIS — Z720 Tobacco use: Secondary | ICD-10-CM | POA: Diagnosis not present

## 2014-06-05 DIAGNOSIS — Z885 Allergy status to narcotic agent status: Secondary | ICD-10-CM | POA: Diagnosis not present

## 2014-06-05 DIAGNOSIS — I10 Essential (primary) hypertension: Secondary | ICD-10-CM | POA: Diagnosis present

## 2014-06-05 DIAGNOSIS — E785 Hyperlipidemia, unspecified: Secondary | ICD-10-CM | POA: Diagnosis present

## 2014-06-05 DIAGNOSIS — Z7901 Long term (current) use of anticoagulants: Secondary | ICD-10-CM | POA: Diagnosis not present

## 2014-06-05 DIAGNOSIS — I340 Nonrheumatic mitral (valve) insufficiency: Secondary | ICD-10-CM | POA: Diagnosis present

## 2014-06-05 DIAGNOSIS — I4891 Unspecified atrial fibrillation: Secondary | ICD-10-CM | POA: Diagnosis not present

## 2014-06-05 LAB — BASIC METABOLIC PANEL
Anion gap: 6 (ref 5–15)
BUN: 13 mg/dL (ref 6–23)
CHLORIDE: 105 mmol/L (ref 96–112)
CO2: 29 mmol/L (ref 19–32)
Calcium: 8.1 mg/dL — ABNORMAL LOW (ref 8.4–10.5)
Creatinine, Ser: 0.84 mg/dL (ref 0.50–1.10)
GFR calc Af Amer: 74 mL/min — ABNORMAL LOW (ref >90)
GFR calc non Af Amer: 64 mL/min — ABNORMAL LOW (ref >90)
Glucose, Bld: 97 mg/dL (ref 70–99)
Potassium: 3.4 mmol/L — ABNORMAL LOW (ref 3.5–5.1)
SODIUM: 140 mmol/L (ref 135–145)

## 2014-06-05 MED ORDER — OFF THE BEAT BOOK
Freq: Once | Status: AC
  Administered 2014-06-05: 1
  Filled 2014-06-05: qty 1

## 2014-06-05 MED ORDER — METOPROLOL TARTRATE 50 MG PO TABS
50.0000 mg | ORAL_TABLET | Freq: Two times a day (BID) | ORAL | Status: DC
  Administered 2014-06-05 – 2014-06-06 (×3): 50 mg via ORAL
  Filled 2014-06-05 (×4): qty 1

## 2014-06-05 MED ORDER — POTASSIUM CHLORIDE CRYS ER 20 MEQ PO TBCR
40.0000 meq | EXTENDED_RELEASE_TABLET | Freq: Once | ORAL | Status: AC
  Administered 2014-06-05: 40 meq via ORAL
  Filled 2014-06-05: qty 2

## 2014-06-05 MED ORDER — AMIODARONE HCL 200 MG PO TABS
400.0000 mg | ORAL_TABLET | Freq: Every day | ORAL | Status: DC
  Administered 2014-06-05 – 2014-06-06 (×2): 400 mg via ORAL
  Filled 2014-06-05 (×2): qty 2

## 2014-06-05 NOTE — Progress Notes (Signed)
Patient: Sarah [name] / Admit Date: 06/03/2014 / Date of Encounter: 06/05/2014, 9:27 AM   Subjective: Feeling much better. Still insistent on going home. Reports +++urination. No orthopnea, dyspnea, CP. Rates 70s-120s on telemetry. DCCV failed to hold NSR yesterday. Has not been out of bed off oxygen yet.   Objective: Telemetry: AF 70s-140s Physical Exam: Blood pressure 124/50, pulse 96, temperature 98.3 F (36.8 C), temperature source Oral, resp. rate 18, height 5\' 2"  (1.575 m), weight 118 lb 13.3 oz (53.9 kg), SpO2 96 %. General: Well developed, well nourished WF in no acute distress. Hoarse voice which family states is chronic. Head: Normocephalic, atraumatic, sclera non-icteric, no xanthomas, nares are without discharge. Neck: Negative for carotid bruits. JVP not elevated. Lungs: Diminished throughout without wheezes, rales, or rhonchi. Breathing is unlabored. Heart: Irregularly irregular, controlled rate, S1 S2 without murmurs, rubs, or gallops.  Abdomen: Soft, non-tender, non-distended with normoactive bowel sounds. No rebound/guarding. Extremities: No clubbing or cyanosis. No edema. Distal pedal pulses are 2+ and equal bilaterally. Neuro: Alert and oriented X 3. Moves all extremities spontaneously. Psych:  Responds to questions appropriately with a normal affect.   Intake/Output Summary (Last 24 hours) at 06/05/14 0927 Last data filed at 06/05/14 0831  Gross per 24 hour  Intake   1975 ml  Output   2000 ml  Net    -25 ml    Inpatient Medications:  . apixaban  2.5 mg Oral BID  . atorvastatin  40 mg Oral Daily  . diltiazem  30 mg Oral 4 times per day  . docusate sodium  100 mg Oral Daily  . furosemide  60 mg Intravenous Q12H  . potassium chloride  40 mEq Oral Once   Infusions:  . sodium chloride 20 mL/hr at 06/04/14 1444    Labs:  Recent Labs  06/04/14 0301 06/05/14 0425  NA 140 140  K 3.8 3.4*  CL 108 105  CO2 26 29  GLUCOSE 106* 97  BUN 16 13    CREATININE 0.64 0.84  CALCIUM 8.3* 8.1*  MG 2.0  --    No results for input(s): AST, ALT, ALKPHOS, BILITOT, PROT, ALBUMIN in the last 72 hours.  Recent Labs  06/03/14 1820 06/04/14 0301  WBC 9.2 7.6  NEUTROABS 5.9 4.3  HGB 11.7* 10.5*  HCT 36.1 31.9*  MCV 100.6* 97.9  PLT 272 243    Recent Labs  06/03/14 1820 06/04/14 0301  TROPONINI <0.03 <0.03   Invalid input(s): POCBNP No results for input(s): HGBA1C in the last 72 hours.   Radiology/Studies:  Dg Chest 2 View  06/03/2014   CLINICAL DATA:  Acute onset of shortness of breath and wheezing. Lower back pain. Initial encounter.  EXAM: CHEST  2 VIEW  COMPARISON:  Chest radiograph performed 12/07/2011  FINDINGS: Bibasilar airspace opacities likely reflect pulmonary edema, though pneumonia might have a similar appearance. Underlying vascular congestion is noted. No definite pleural effusion or pneumothorax is seen.  The heart is borderline normal in size. No acute osseous abnormalities are identified. A metallic device is noted overlying the upper abdomen. There is mild degenerative change at the upper lumbar spine.  IMPRESSION: Bibasilar airspace opacities likely reflect pulmonary edema, though pneumonia might have a similar appearance. Underlying vascular congestion noted.   Electronically Signed   By: Garald Balding M.D.   On: 06/03/2014 18:04   Dg Lumbar Spine Complete  05/10/2014   CLINICAL DATA:  Low back pain following fall 2 days ago, initial encounter  EXAM: LUMBAR  SPINE - COMPLETE 4+ VIEW  COMPARISON:  12/07/2011  FINDINGS: Five lumbar type vertebral bodies are well visualized. Mild scoliosis is again noted of the thoracolumbar spine. Some compensatory degenerative changes are noted. Facet hypertrophic changes are seen. No compression deformities are noted. Diffuse aortic calcifications are seen.  IMPRESSION: Chronic changes without acute abnormality.   Electronically Signed   By: Inez Catalina M.D.   On: 05/10/2014 11:48   Mr  Lumbar Spine Wo Contrast  06/02/2014   CLINICAL DATA:  Low back pain with LEFT gluteal pain. No leg problems. No numbness or weakness. Lumbago. LEFT-sided sciatica.  EXAM: MRI LUMBAR SPINE WITHOUT CONTRAST  TECHNIQUE: Multiplanar, multisequence MR imaging of the lumbar spine was performed. No intravenous contrast was administered.  COMPARISON:  05/10/2014.  MRI 06/10/2013.  FINDINGS: The numbering convention used for this exam termed L5-S1 as the last intervertebral disc space. 5 lumbar type vertebral bodies on prior radiographs.  Alignment: Dextroconvex curve of the lumbar spine with the apex at L4. Grade I retrolisthesis at every level of the lumbar spine except for L4-L5 due to collapse of the disc space. Retrolisthesis measures 4 mm and under.  Vertebrae: Severe degenerative endplate changes at G2-R4 and L2-L3. Every level except L4-L5 shows degenerative endplate changes. No compression fracture. Discogenic marrow edema is present at L1-L2, L2-L3 and L3-L4.  Conus medullaris: Normal at L1.  Paraspinal tissues: Distended gallbladder. Unchanged renal cysts compared to prior MRI. Probable bilateral pleural effusions.  Disc levels:  Lower thoracic levels show disc desiccation. T10-T11 shows mild central stenosis. There is a central disc extrusion with caudal migration of disc material indenting the distal thoracic cord. Foramina appear patent. T11-T12 shows disc desiccation but no stenosis.  T12-L1:  Negative.  L1-L2: RIGHT eccentric broad-based posterior disc bulging and endplate osteophytes. RIGHT lateral protrusion is present. Mild facet arthrosis and ligamentum flavum redundancy.  L2-L3: Mild central stenosis associated with broad-based disc bulging and endplate osteophytes. Posterior ligamentum flavum redundancy is also present. Mild bilateral foraminal stenosis secondary to degenerative disc disease.  L3-L4: Moderate multifactorial central stenosis. LEFT eccentric broad-based posterior disc bulging. LEFT  foraminal disc extrusion with cranial migration. Moderate to severe LEFT foraminal stenosis potentially affecting the LEFT L3 nerve. LEFT subarticular stenosis. The RIGHT neural foramen appears patent. Bilateral facet arthrosis.  L4-L5: Moderate multifactorial central stenosis. Shallow broad-based disc bulging. Moderate LEFT foraminal stenosis due to disc bulging, scoliosis and facet arthrosis. Bilateral subarticular stenosis is present.  L5-S1: Severe disc degeneration with collapse of the disc space. There is moderate bilateral foraminal stenosis due to endplate spurring, loss of disc height and facet arthrosis. There is a focal disc extrusion in the RIGHT subarticular zone with caudal extension of disc material. This compresses the descending RIGHT S1 nerve. Mild encroachment on the descending LEFT S1 nerve but no compression. Central canal is patent.  IMPRESSION: Severe lumbar spondylosis detailed above. In this patient with LEFT-sided radicular symptoms, the foraminal stenosis at L3-L4 and L4-L5 along with subarticular stenosis at L3-L4 is potentially implicated. There is also severe L5-S1 disease eccentric to the RIGHT however the significance is unclear in this patient with LEFT-sided predominant symptoms by report.   Electronically Signed   By: Dereck Ligas M.D.   On: 06/02/2014 13:14     Assessment and Plan  1. Newly recognized atrial fibrillation with RVR 2. Acute combined CHF - EF 40-45% 3. Hypoxia - O2 sat 88% on RA 4. Anemia, normocytic, with small Hgb in urine 5. HTN 6. Hyperlipidemia  7. Ongoing tobacco abuse 8. LBBB  Rates continue to intermittently elevate at times. She reports she is feeling better - remains motivated for early discharge. Note LV dysfunction by echocardiogram - unclear at this point if ICM/NICM (IE tachy-mediated). I will discuss med regimen with MD. Diltiazem is not a great choice with her cardiomyopathy. Might consider switching to BB. I also do suspect component  of COPD exam, however, she is not currently wheezing. She tells me she is likely to smoke her next cigarette in the car on the way home despite urging to quit. We don't have a tremendous amount of BP to work with so I would focus efforts on rate control before adding ACEI.     Given hypoxia on admission (continuing to require [place]), will ask nursing to walk her this AM to assess HR and oxygen needs. My preference would be for her to stay to allow [place] to continue to titrate medications for improved HR control but as above, will d/w MD.   She denies any recent anginal sx but given cardiac risk factors may need ischemic workup at some point.  She also denies urinary symptoms - will need f/u of her CBC and UA. (Microscopic hematuria is listed in her chronic problem list.)  Signed, Dayna Dunn PA-C  I have examined the patient and reviewed assessment and plan and discussed with patient.  Agree with above as stated.  Start metoprolol 50 mg BID.  Start Amiodarone 400 mg daily as she may benefit from rhythm control.  Continue IV LAsix.  She wants to go home by 5 PM to feed her cats.  Her son is coming today and will help make her stay per the daughter.  Breathing appears better today after diuresis.    Ideally, would do ischemia w/u given lower EF but I don't think she will agree.  THe daughter also does not feel that the patient would agree to further testing.  There may also be a component of COPD.   Explained to the patient that she would have to be able to walk off of oxygen, maintain sats and reasonable HR control.    VARANASI,JAYADEEP S.

## 2014-06-05 NOTE — Progress Notes (Signed)
Pt ambulated 64ft this am. She did not go very far, before she stated that she needed to go back to the room to use the bathroom. Her sats were 91% on room air. Sats began to drop and went down to 87% on room air. Pt wanted to continue to walk after using the bathroom, but refused to walk with oxygen on. I did not walk patient again without oxygen. After getting back into bed, she did allow me to put oxygen back on. Pt stated that she was not going to stop smoking even if she had to be on oxygen at home. She did say that she would take the oxygen off while she smokes.

## 2014-06-05 NOTE — Progress Notes (Signed)
Pt ambulated in hallway without oxygen.  Sats resting were 97%. While walking she went as low as 88%, but she recovered quickly. Sats immediately went back up to 91% while on room air. After walking sats resting was 93% on room air. Heart rate stayed between 80's and 90's. Paged PA to give results.

## 2014-06-05 NOTE — Progress Notes (Signed)
UR completed 

## 2014-06-05 NOTE — Progress Notes (Signed)
SATURATION QUALIFICATIONS: (This note is used to comply with regulatory documentation for home oxygen)  Patient Saturations on Room Air at Rest = 91% Patient Saturations on Room Air while Ambulating = 87%  Patient Saturations on 2 Liters of oxygen while Ambulating =93%  Please briefly explain why patient needs home oxygen: sats are dropping with ambulation

## 2014-06-06 ENCOUNTER — Other Ambulatory Visit: Payer: Self-pay | Admitting: Physician Assistant

## 2014-06-06 ENCOUNTER — Encounter (HOSPITAL_COMMUNITY): Payer: Self-pay | Admitting: Physician Assistant

## 2014-06-06 DIAGNOSIS — I4891 Unspecified atrial fibrillation: Secondary | ICD-10-CM

## 2014-06-06 LAB — CBC
HCT: 37.4 % (ref 36.0–46.0)
Hemoglobin: 12.2 g/dL (ref 12.0–15.0)
MCH: 32.1 pg (ref 26.0–34.0)
MCHC: 32.6 g/dL (ref 30.0–36.0)
MCV: 98.4 fL (ref 78.0–100.0)
PLATELETS: 286 10*3/uL (ref 150–400)
RBC: 3.8 MIL/uL — AB (ref 3.87–5.11)
RDW: 14.3 % (ref 11.5–15.5)
WBC: 8.3 10*3/uL (ref 4.0–10.5)

## 2014-06-06 LAB — BASIC METABOLIC PANEL
Anion gap: 9 (ref 5–15)
BUN: 10 mg/dL (ref 6–23)
CALCIUM: 8.5 mg/dL (ref 8.4–10.5)
CO2: 31 mmol/L (ref 19–32)
CREATININE: 0.86 mg/dL (ref 0.50–1.10)
Chloride: 100 mmol/L (ref 96–112)
GFR calc non Af Amer: 62 mL/min — ABNORMAL LOW (ref >90)
GFR, EST AFRICAN AMERICAN: 72 mL/min — AB (ref >90)
Glucose, Bld: 89 mg/dL (ref 70–99)
POTASSIUM: 3.1 mmol/L — AB (ref 3.5–5.1)
SODIUM: 140 mmol/L (ref 135–145)

## 2014-06-06 MED ORDER — APIXABAN 2.5 MG PO TABS: 2.5000 mg | ORAL_TABLET | Freq: Two times a day (BID) | ORAL | Status: DC

## 2014-06-06 MED ORDER — POTASSIUM CHLORIDE CRYS ER 20 MEQ PO TBCR
40.0000 meq | EXTENDED_RELEASE_TABLET | Freq: Once | ORAL | Status: AC
  Administered 2014-06-06: 40 meq via ORAL
  Filled 2014-06-06: qty 2

## 2014-06-06 MED ORDER — POTASSIUM CHLORIDE ER 20 MEQ PO TBCR: 20.0000 meq | EXTENDED_RELEASE_TABLET | Freq: Every day | ORAL | Status: DC

## 2014-06-06 MED ORDER — METOPROLOL TARTRATE 50 MG PO TABS: 50.0000 mg | ORAL_TABLET | Freq: Two times a day (BID) | ORAL | Status: DC

## 2014-06-06 MED ORDER — AMIODARONE HCL 400 MG PO TABS: 400.0000 mg | ORAL_TABLET | Freq: Every day | ORAL | Status: DC

## 2014-06-06 MED ORDER — FUROSEMIDE 40 MG PO TABS: 40.0000 mg | ORAL_TABLET | Freq: Two times a day (BID) | ORAL | Status: DC

## 2014-06-06 NOTE — Discharge Summary (Signed)
Discharge Summary   Patient ID: AMMY LIENHARD,  MRN: [id number], DOB/AGE: [birth year] 81 y.o.  Admit date: 06/03/2014 Discharge date: 06/06/2014  Primary Care Provider: O'SULLIVAN,MELISSA S. Primary Cardiologist: New - seen by Dr. Irish Lack, however wish to establish care at [facility]  Discharge Diagnoses Principal Problem:   Atrial fibrillation with RVR Active Problems:   Essential hypertension   Tobacco user   Microscopic hematuria   LBBB (left bundle branch block)   Hypokalemia   Acute combined systolic and diastolic heart failure   LV dysfunction   Anemia   Allergies Allergies  Allergen Reactions  . Morphine And Related Nausea And Vomiting    Procedures  Transesophageal echocardiogram with cardioversion 06/04/2014  LV EF: 40% -  45%  ------------------------------------------------------------------- Indications:   Atrial fibrillation - 427.31.  ------------------------------------------------------------------- History:  PMH:  Atrial fibrillation.  ------------------------------------------------------------------- Study Conclusions  - Left ventricle: Systolic function was mildly to moderately reduced. The estimated ejection fraction was in the range of 40% to 45%. No evidence of thrombus. - Mitral valve: There was mild regurgitation. - Left atrium: No evidence of thrombus in the atrial cavity or appendage. There was spontaneous echo contrast (&quot;smoke&quot;).  Impressions:  - Successful cardioversion. No cardiac source of emboli was indentified.    [place]on is a 81 y.o. female with hypertension, hyperlipidemia, active smoker with 60+ pack year smoking history, who [facility] [place] [facility] for [facility]ness of breath, and was found to be in AFib with RVR. No prior cardiac history. She also endorse SOB for the past 3 days, although denies any palpitation or CP. On arrival, CXR  was consistent with pulmonary edema. BNP was elevated. She was placed on IV diuresis. As for her a-fib with RVR, she was started on IV diltiazem with good response and was placed on lovenox. Given her CHADS-Vasc score of 3, lovenox was discontinued and she was placed on low dose eliquis BID.   Patient initially did not wish to stay in the hospital as she had to rush home to feed her cat, however after discussing with the patient's daughter, she finally agreed to stay. She underwent TEE with cardioversion on the same day which showed EF 40-45%, no evidence of thrombus. She was shocked twice at 120 and 200 joule, unfortunately cardioversion was unsuccessful. She was seen on the follow day, however she continue to have intermittent fast HR. Patient states she will resume smoking after going home despite repeated urge for cessation. Given her mild LV dysfunction, her diltiazem was discontinued and she was placed on 50mg  BID metoprolol. She was ambulated on 06/05/2014 by nursing staff, her O2 sat did drop down to 87% on room air. She was diured further overnight.   She was seen in the morning of 06/06/2014, at which time she denies any significant shortness of breath. She walked without oxygen today and has been able to maintain her O2 saturation. She is deemed stable for discharge from cardiology perspective. She was transitioned to oral Lasix 40 mg twice a day along with 20 mEq of potassium daily. She wished to follow-[facility] which is closer to home. We will continue on the current regimen of amiodarone and metoprolol, after one month, she may be able to decrease her dose of amiodarone. We have discussed with health issues with her son, normally she will need ischemic workup for her newly diagnosed cardiomyopathy, however patient is not going to agree to anymore test  at this time. Although patient did qualify for home oxygen based on O2 saturation obtained by nursing staff on 06/05/2014, however she feels  better today and she is not interested in any home oxygen at this time. Her O2 saturation is improved after diuresis. He haven't given her further heart failure education including low-sodium diet, importance of diuretic and daily weight. We have discussed tobacco cessation with her, however she is not interested in quitting at this time and told the nurse she will continue to smoke even if she has to go on home oxygen.  Discharge Vitals Blood pressure 104/41, pulse 68, temperature 98.8 F (37.1 C), temperature source Oral, resp. rate 18, height 5\' 2"  (1.575 m), weight 113 lb 15.7 oz (51.7 kg), SpO2 96 %.  Filed Weights   06/04/14 0536 06/05/14 0629 06/06/14 0536  Weight: 125 lb 14.1 oz (57.1 kg) 118 lb 13.3 oz (53.9 kg) 113 lb 15.7 oz (51.7 kg)    Labs  CBC  Recent Labs  06/03/14 1820 06/04/14 0301 06/06/14 0500  WBC 9.2 7.6 8.3  NEUTROABS 5.9 4.3  --   HGB 11.7* 10.5* 12.2  HCT 36.1 31.9* 37.4  MCV 100.6* 97.9 98.4  PLT 272 243 063   Basic Metabolic Panel  Recent Labs  06/04/14 0301 06/05/14 0425 06/06/14 0500  NA 140 140 140  K 3.8 3.4* 3.1*  CL 108 105 100  CO2 26 29 31   GLUCOSE 106* 97 89  BUN 16 13 10   CREATININE 0.64 0.84 0.86  CALCIUM 8.3* 8.1* 8.5  MG 2.0  --   --    Cardiac Enzymes  Recent Labs  06/03/14 1820 06/04/14 0301  TROPONINI <0.03 <0.03    Disposition  Pt is being discharged home today in good condition.  Follow-up Plans & Appointments      Follow-up Information    Follow up with Kirk Ruths, MD On 07/18/2014.   Specialty:  Cardiology   Why:  Above address is where Dr. Stanford Breed works most of the time. Your appointment is at [facility] 3rd floor. [place] information:   [place] [address] [phone]       Please follow up.   Why:  Patient also need 1 week BMET to check her renal function, closest lab would be [place] [facility] on the 3rd floor of [facility].       Discharge Medications    Medication List    STOP taking these medications        amLODipine 5 MG tablet  Commonly known as:  NORVASC     aspirin 81 MG tablet      TAKE these medications        acetaminophen 500 MG tablet  Commonly known as:  TYLENOL  Take 500 mg by mouth 2 (two) times daily as needed.     alendronate 70 MG tablet  Commonly known as:  FOSAMAX  Take 1 tablet (70 mg total) by mouth every 7 (seven) days. Take with a full glass of water on an empty stomach.     ALPRAZolam 0.5 MG tablet  Commonly known as:  XANAX  Take 0.5 mg by mouth daily.     amiodarone 400 MG tablet  Commonly known as:  PACERONE  Take 1 tablet (400 mg total) by mouth daily.     apixaban 2.5 MG Tabs tablet  Commonly known as:  ELIQUIS  Take 1 tablet (2.5 mg total) by mouth 2 (two) times daily.     atorvastatin 40 MG tablet  Commonly known as:  LIPITOR  Take 1 tablet (40 mg total) by mouth daily.     bisacodyl 5 MG EC tablet  Generic drug:  bisacodyl  Take 5 mg by mouth daily as needed.     Black Cohosh 540 MG Caps  Take 540 mg by mouth daily.     CALTRATE 600+D PO  Take 1 tablet by mouth 2 (two) times daily.     celecoxib 100 MG capsule  Commonly known as:  CELEBREX  Take 1 capsule (100 mg total) by mouth daily.     diphenhydrAMINE 25 MG tablet  Commonly known as:  BENADRYL  Take 25 mg by mouth at bedtime as needed for sleep.     docusate sodium 100 MG capsule  Commonly known as:  COLACE  Take 100 mg by mouth daily.     furosemide 40 MG tablet  Commonly known as:  LASIX  Take 1 tablet (40 mg total) by mouth 2 (two) times daily.     glucosamine-chondroitin 500-400 MG tablet  Take 1 tablet by mouth 2 (two) times daily.     metoprolol 50 MG tablet  Commonly known as:  LOPRESSOR  Take 1 tablet (50 mg total) by mouth 2 (two) times daily.     polycarbophil 625 MG tablet  Commonly known as:  FIBERCON  Take 625 mg by mouth  daily.     Potassium Chloride ER 20 MEQ Tbcr  Take 20 mEq by mouth daily. Take daily on the same day of taking lasix     traMADol 50 MG tablet  Commonly known as:  ULTRAM  Take 1 tablet (50 mg total) by mouth every 8 (eight) hours as needed.     Vitamin D3 2000 UNITS Tabs  Take 1 tablet by mouth daily.        Outstanding Labs/Studies  1 week BMET at [facility]  Duration of Discharge Encounter   Greater than 30 minutes including physician time.  Hilbert Corrigan PA-C Pager: [phone] 06/06/2014, 1:29 PM   I have examined the patient and reviewed assessment and plan and discussed with patient.  Agree with above as stated.  Please see my note from today regarding anticoagulation, rate/rhythm control and heart failure management.   Dyneisha Murchison S.

## 2014-06-06 NOTE — Progress Notes (Signed)
Echocardiogram 2D Echocardiogram has been performed.  Sarah [name] 06/06/2014, 7:57 AM

## 2014-06-06 NOTE — Progress Notes (Signed)
Prior auth for Eliquis approved after my phone call.  [id number]  Jettie Booze, MD

## 2014-06-06 NOTE — Progress Notes (Signed)
SUBJECTIVE:  She feels better. She walked without oxygen today. She has been maintaining her oxygen saturations.  OBJECTIVE:   Vitals:   Filed Vitals:   06/05/14 1629 06/05/14 2036 06/06/14 0536 06/06/14 0849  BP:  116/61 106/52 104/41  Pulse:  94 57 68  Temp:  97.4 F (36.3 C) 98.4 F (36.9 C) 98.8 F (37.1 C)  TempSrc:  Oral Oral Oral  Resp:  18 16 18   Height:      Weight:   113 lb 15.7 oz (51.7 kg)   SpO2: 97% 98% 95% 96%   I&O's:   Intake/Output Summary (Last 24 hours) at 06/06/14 1101 Last data filed at 06/06/14 9563  Gross per 24 hour  Intake    960 ml  Output   3001 ml  Net  -2041 ml   TELEMETRY: Reviewed telemetry pt in normal sinus rhythm, with intermittent atrial fibrillation:     PHYSICAL EXAM General: Well developed, well nourished, in no acute distress Head:   Normal cephalic and atramatic  Lungs:   Mild bilateral wheezing, improved. Heart:   HRRR S1 S2  No JVD.   Abdomen: abdomen soft and non-tender Msk:  Back normal,  Normal strength and tone for age. Extremities:   Trace lower extremity edema.   Neuro: Alert and oriented. Psych:  Normal affect, responds appropriately Skin: No rash   LABS: Basic Metabolic Panel:  Recent Labs  06/04/14 0301 06/05/14 0425 06/06/14 0500  NA 140 140 140  K 3.8 3.4* 3.1*  CL 108 105 100  CO2 26 29 31   GLUCOSE 106* 97 89  BUN 16 13 10   CREATININE 0.64 0.84 0.86  CALCIUM 8.3* 8.1* 8.5  MG 2.0  --   --    Liver Function Tests: No results for input(s): AST, ALT, ALKPHOS, BILITOT, PROT, ALBUMIN in the last 72 hours. No results for input(s): LIPASE, AMYLASE in the last 72 hours. CBC:  Recent Labs  06/03/14 1820 06/04/14 0301 06/06/14 0500  WBC 9.2 7.6 8.3  NEUTROABS 5.9 4.3  --   HGB 11.7* 10.5* 12.2  HCT 36.1 31.9* 37.4  MCV 100.6* 97.9 98.4  PLT 272 243 286   Cardiac Enzymes:  Recent Labs  06/03/14 1820 06/04/14 0301  TROPONINI <0.03 <0.03   BNP: Invalid input(s): POCBNP D-Dimer: No  results for input(s): DDIMER in the last 72 hours. Hemoglobin A1C: No results for input(s): HGBA1C in the last 72 hours. Fasting Lipid Panel: No results for input(s): CHOL, HDL, LDLCALC, TRIG, CHOLHDL, LDLDIRECT in the last 72 hours. Thyroid Function Tests: No results for input(s): TSH, T4TOTAL, T3FREE, THYROIDAB in the last 72 hours.  Invalid input(s): FREET3 Anemia Panel: No results for input(s): VITAMINB12, FOLATE, FERRITIN, TIBC, IRON, RETICCTPCT in the last 72 hours. Coag Panel:   No results found for: INR, PROTIME  RADIOLOGY: Dg Chest 2 View  06/03/2014   CLINICAL DATA:  Acute onset of shortness of breath and wheezing. Lower back pain. Initial encounter.  EXAM: CHEST  2 VIEW  COMPARISON:  Chest radiograph performed 12/07/2011  FINDINGS: Bibasilar airspace opacities likely reflect pulmonary edema, though pneumonia might have a similar appearance. Underlying vascular congestion is noted. No definite pleural effusion or pneumothorax is seen.  The heart is borderline normal in size. No acute osseous abnormalities are identified. A metallic device is noted overlying the upper abdomen. There is mild degenerative change at the upper lumbar spine.  IMPRESSION: Bibasilar airspace opacities likely reflect pulmonary edema, though pneumonia might have a similar  appearance. Underlying vascular congestion noted.   Electronically Signed   By: Garald Balding M.D.   On: 06/03/2014 18:04   Dg Lumbar Spine Complete  05/10/2014   CLINICAL DATA:  Low back pain following fall 2 days ago, initial encounter  EXAM: LUMBAR SPINE - COMPLETE 4+ VIEW  COMPARISON:  12/07/2011  FINDINGS: Five lumbar type vertebral bodies are well visualized. Mild scoliosis is again noted of the thoracolumbar spine. Some compensatory degenerative changes are noted. Facet hypertrophic changes are seen. No compression deformities are noted. Diffuse aortic calcifications are seen.  IMPRESSION: Chronic changes without acute abnormality.    Electronically Signed   By: Inez Catalina M.D.   On: 05/10/2014 11:48   Mr Lumbar Spine Wo Contrast  06/02/2014   CLINICAL DATA:  Low back pain with LEFT gluteal pain. No leg problems. No numbness or weakness. Lumbago. LEFT-sided sciatica.  EXAM: MRI LUMBAR SPINE WITHOUT CONTRAST  TECHNIQUE: Multiplanar, multisequence MR imaging of the lumbar spine was performed. No intravenous contrast was administered.  COMPARISON:  05/10/2014.  MRI 06/10/2013.  FINDINGS: The numbering convention used for this exam termed L5-S1 as the last intervertebral disc space. 5 lumbar type vertebral bodies on prior radiographs.  Alignment: Dextroconvex curve of the lumbar spine with the apex at L4. Grade I retrolisthesis at every level of the lumbar spine except for L4-L5 due to collapse of the disc space. Retrolisthesis measures 4 mm and under.  Vertebrae: Severe degenerative endplate changes at X8-P3 and L2-L3. Every level except L4-L5 shows degenerative endplate changes. No compression fracture. Discogenic marrow edema is present at L1-L2, L2-L3 and L3-L4.  Conus medullaris: Normal at L1.  Paraspinal tissues: Distended gallbladder. Unchanged renal cysts compared to prior MRI. Probable bilateral pleural effusions.  Disc levels:  Lower thoracic levels show disc desiccation. T10-T11 shows mild central stenosis. There is a central disc extrusion with caudal migration of disc material indenting the distal thoracic cord. Foramina appear patent. T11-T12 shows disc desiccation but no stenosis.  T12-L1:  Negative.  L1-L2: RIGHT eccentric broad-based posterior disc bulging and endplate osteophytes. RIGHT lateral protrusion is present. Mild facet arthrosis and ligamentum flavum redundancy.  L2-L3: Mild central stenosis associated with broad-based disc bulging and endplate osteophytes. Posterior ligamentum flavum redundancy is also present. Mild bilateral foraminal stenosis secondary to degenerative disc disease.  L3-L4: Moderate multifactorial  central stenosis. LEFT eccentric broad-based posterior disc bulging. LEFT foraminal disc extrusion with cranial migration. Moderate to severe LEFT foraminal stenosis potentially affecting the LEFT L3 nerve. LEFT subarticular stenosis. The RIGHT neural foramen appears patent. Bilateral facet arthrosis.  L4-L5: Moderate multifactorial central stenosis. Shallow broad-based disc bulging. Moderate LEFT foraminal stenosis due to disc bulging, scoliosis and facet arthrosis. Bilateral subarticular stenosis is present.  L5-S1: Severe disc degeneration with collapse of the disc space. There is moderate bilateral foraminal stenosis due to endplate spurring, loss of disc height and facet arthrosis. There is a focal disc extrusion in the RIGHT subarticular zone with caudal extension of disc material. This compresses the descending RIGHT S1 nerve. Mild encroachment on the descending LEFT S1 nerve but no compression. Central canal is patent.  IMPRESSION: Severe lumbar spondylosis detailed above. In this patient with LEFT-sided radicular symptoms, the foraminal stenosis at L3-L4 and L4-L5 along with subarticular stenosis at L3-L4 is potentially implicated. There is also severe L5-S1 disease eccentric to the RIGHT however the significance is unclear in this patient with LEFT-sided predominant symptoms by report.   Electronically Signed   By: Arvilla Market.D.  On: 06/02/2014 13:14      ASSESSMENT: Sarah [name]:    1) atrial fibrillation: Continue Eliquis for stroke prevention. Prior authorization was completed earlier today. Continue current regimen of amiodarone and metoprolol. After about a month, she may be able to decrease her dosage of amiodarone.  2) combined systolic and diastolic heart failure: She is now more euvolemic. Her breathing is better. We'll transition her to oral Lasix, 40 mg by mouth twice a day. First dose to be taken first thing in the morning. Second dose to be taken after lunch. Would also send home  with potassium 20 mEq daily.  3) she wants to follow-up in [facility] which is closer to her home.  4) discussed her health issues with her son. The patient does have a cardiomyopathy. Normally, we would work her up for coronary artery disease. The patient is not going to voluntarily have any more tests. Of note, she also qualifies for home oxygen based on results from yesterday. Since she feels better, she is not interested in home oxygen. Her oxygen saturations have continued to improve with diuresis. This may normalize.  5) she will need heart failure education including low sodium diet, importance of diuretics and daily weights.  Jettie Booze, MD  06/06/2014  11:01 AM

## 2014-06-06 NOTE — Discharge Instructions (Addendum)
Information on my medicine - ELIQUIS (apixaban)  This medication education was reviewed with me or my healthcare representative as part of my discharge preparation.   Why was Eliquis prescribed for you? Eliquis was prescribed for you to reduce the risk of a blood clot forming that can cause a stroke if you have a medical condition called atrial fibrillation (a type of irregular heartbeat).  What do You need to know about Eliquis ? Take your Eliquis TWICE DAILY - one tablet in the morning and one tablet in the evening with or without food. If you have difficulty swallowing the tablet whole please discuss with your pharmacist how to take the medication safely.  Take Eliquis exactly as prescribed by your doctor and DO NOT stop taking Eliquis without talking to the doctor who prescribed the medication.  Stopping may increase your risk of developing a stroke.  Refill your prescription before you run out.  After discharge, you should have regular check-up appointments with your healthcare provider that is prescribing your Eliquis.  In the future your dose may need to be changed if your kidney function or weight changes by a significant amount or as you get older.  What do you do if you miss a dose? If you miss a dose, take it as soon as you remember on the same day and resume taking twice daily.  Do not take more than one dose of ELIQUIS at the same time to make up a missed dose.  Important Safety Information A possible side effect of Eliquis is bleeding. You should call your healthcare provider right away if you experience any of the following: ? Bleeding from an injury or your nose that does not stop. ? Unusual colored urine (red or dark brown) or unusual colored stools (red or black). ? Unusual bruising for unknown reasons. ? A serious fall or if you hit your head (even if there is no bleeding).  Some medicines may interact with Eliquis and might increase your risk of bleeding or  clotting while on Eliquis. To help avoid this, consult your healthcare provider or pharmacist prior to using any new prescription or non-prescription medications, including herbals, vitamins, non-steroidal anti-inflammatory drugs (NSAIDs) and supplements.  This website has more information on Eliquis (apixaban): http://www.eliquis.com/eliquis/home Atrial Fibrillation Atrial fibrillation is a type of irregular heart rhythm (arrhythmia). During atrial fibrillation, the upper chambers of the heart (atria) quiver continuously in a chaotic pattern. This causes an irregular and often rapid heart rate.  Atrial fibrillation is the result of the heart becoming overloaded with disorganized signals that tell it to beat. These signals are normally released one at a time by a part of the right atrium called the sinoatrial node. They then travel from the atria to the lower chambers of the heart (ventricles), causing the atria and ventricles to contract and pump blood as they pass. In atrial fibrillation, parts of the atria outside of the sinoatrial node also release these signals. This results in two problems. First, the atria receive so many signals that they do not have time to fully contract. Second, the ventricles, which can only receive one signal at a time, beat irregularly and out of rhythm with the atria.  There are three types of atrial fibrillation:   Paroxysmal. Paroxysmal atrial fibrillation starts suddenly and stops on its own within a week.  Persistent. Persistent atrial fibrillation lasts for more than a week. It may stop on its own or with treatment.  Permanent. Permanent atrial fibrillation does not  go away. Episodes of atrial fibrillation may lead to permanent atrial fibrillation. Atrial fibrillation can prevent your heart from pumping blood normally. It increases your risk of stroke and can lead to heart failure.  CAUSES   Heart conditions, including a heart attack, heart failure, coronary  artery disease, and heart valve conditions.   Inflammation of the sac that surrounds the heart (pericarditis).  Blockage of an artery in the lungs (pulmonary embolism).  Pneumonia or other infections.  Chronic lung disease.  Thyroid problems, especially if the thyroid is overactive (hyperthyroidism).  Caffeine, excessive alcohol use, and use of some illegal drugs.   Use of some medicines, including certain decongestants and diet pills.  Heart surgery.   Birth defects.  Sometimes, no cause can be found. When this happens, the atrial fibrillation is called lone atrial fibrillation. The risk of complications from atrial fibrillation increases if you have lone atrial fibrillation and you are age 18 years or older. RISK FACTORS  Heart failure.  Coronary artery disease.  Diabetes mellitus.   High blood pressure (hypertension).   Obesity.   Other arrhythmias.   Increased age. SIGNS AND SYMPTOMS   A feeling that your heart is beating rapidly or irregularly.   A feeling of discomfort or pain in your chest.   Shortness of breath.   Sudden light-headedness or weakness.   Getting tired easily when exercising.   Urinating more often than normal (mainly when atrial fibrillation first begins).  In paroxysmal atrial fibrillation, symptoms may start and suddenly stop. DIAGNOSIS  Your health care provider may be able to detect atrial fibrillation when taking your pulse. Your health care provider may have you take a test called an ambulatory electrocardiogram (ECG). An ECG records your heartbeat patterns over a 24-hour period. You may also have other tests, such as:  Transthoracic echocardiogram (TTE). During echocardiography, sound waves are used to evaluate how blood flows through your heart.  Transesophageal echocardiogram (TEE).  Stress test. There is more than one type of stress test. If a stress test is needed, ask your health care provider about which type is  best for you.  Chest X-ray exam.  Blood tests.  Computed tomography (CT). TREATMENT  Treatment may include:  Treating any underlying conditions. For example, if you have an overactive thyroid, treating the condition may correct atrial fibrillation.  Taking medicine. Medicines may be given to control a rapid heart rate or to prevent blood clots, heart failure, or a stroke.  Having a procedure to correct the rhythm of the heart:  Electrical cardioversion. During electrical cardioversion, a controlled, low-energy shock is delivered to the heart through your skin. If you have chest pain, very low blood pressure, or sudden heart failure, this procedure may need to be done as an emergency.  Catheter ablation. During this procedure, heart tissues that send the signals that cause atrial fibrillation are destroyed.  Surgical ablation. During this surgery, thin lines of heart tissue that carry the abnormal signals are destroyed. This procedure can either be an open-heart surgery or a minimally invasive surgery. With the minimally invasive surgery, small cuts are made to access the heart instead of a large opening.  Pulmonary venous isolation. During this surgery, tissue around the veins that carry blood from the lungs (pulmonary veins) is destroyed. This tissue is thought to carry the abnormal signals. HOME CARE INSTRUCTIONS   Take medicines only as directed by your health care provider. Some medicines can make atrial fibrillation worse or recur.  If blood thinners  were prescribed by your health care provider, take them exactly as directed. Too much blood-thinning medicine can cause bleeding. If you take too little, you will not have the needed protection against stroke and other problems.  Perform blood tests at home if directed by your health care provider. Perform blood tests exactly as directed.  Quit smoking if you smoke.  Do not drink alcohol.  Do not drink caffeinated beverages such  as coffee, soda, and some teas. You may drink decaffeinated coffee, soda, or tea.   Maintain a healthy weight.Do not use diet pills unless your health care provider approves. They may make heart problems worse.   Follow diet instructions as directed by your health care provider.  Exercise regularly as directed by your health care provider.  Keep all follow-up visits as directed by your health care provider. This is important. PREVENTION  The following substances can cause atrial fibrillation to recur:   Caffeinated beverages.  Alcohol.  Certain medicines, especially those used for breathing problems.  Certain herbs and herbal medicines, such as those containing ephedra or ginseng.  Illegal drugs, such as cocaine and amphetamines. Sometimes medicines are given to prevent atrial fibrillation from recurring. Proper treatment of any underlying condition is also important in helping prevent recurrence.  SEEK MEDICAL CARE IF:  You notice a change in the rate, rhythm, or strength of your heartbeat.  You suddenly begin urinating more frequently.  You tire more easily when exerting yourself or exercising. SEEK IMMEDIATE MEDICAL CARE IF:   You have chest pain, abdominal pain, sweating, or weakness.  You feel nauseous.  You have shortness of breath.  You suddenly have swollen feet and ankles.  You feel dizzy.  Your face or limbs feel numb or weak.  You have a change in your vision or speech. MAKE SURE YOU:   Understand these instructions.  Will watch your condition.  Will get help right away if you are not doing well or get worse. Document Released: 04/20/2005 Document Revised: 09/04/2013 Document Reviewed: 05/31/2012 [facility] Patient Information 2015 [place], [place]. This information is not intended to replace advice given to you by your health care provider. Make sure you discuss any questions you have with your health care provider.  Avoid salty food, limited amount  of fluid intake to < 2L per day.  Weigh your self daily, call cardiology if weight increase by more than 3 lbs overnight or 5lbs in a single week.

## 2014-06-07 ENCOUNTER — Telehealth: Payer: Self-pay | Admitting: Cardiology

## 2014-06-12 ENCOUNTER — Encounter: Payer: Self-pay | Admitting: Family

## 2014-06-12 MED ORDER — TRAMADOL HCL 50 MG PO TABS: 50.0000 mg | ORAL_TABLET | Freq: Three times a day (TID) | ORAL | Status: DC | PRN

## 2014-06-12 NOTE — Telephone Encounter (Signed)
Rx placed at front desk for pick up and pt's daughter notified.

## 2014-06-12 NOTE — Addendum Note (Signed)
Addended by: Kelle Darting A on: 06/12/2014 04:15 PM   Modules accepted: Orders

## 2014-06-13 ENCOUNTER — Other Ambulatory Visit: Payer: Self-pay | Admitting: Cardiology

## 2014-06-13 DIAGNOSIS — E876 Hypokalemia: Secondary | ICD-10-CM | POA: Diagnosis not present

## 2014-06-13 LAB — BASIC METABOLIC PANEL
BUN: 23 mg/dL (ref 6–23)
CALCIUM: 10.2 mg/dL (ref 8.4–10.5)
CHLORIDE: 102 meq/L (ref 96–112)
CO2: 28 mEq/L (ref 19–32)
Creat: 1.09 mg/dL (ref 0.50–1.10)
GLUCOSE: 114 mg/dL — AB (ref 70–99)
POTASSIUM: 5.3 meq/L (ref 3.5–5.3)
Sodium: 143 mEq/L (ref 135–145)

## 2014-06-13 NOTE — Telephone Encounter (Signed)
Closed encounter °

## 2014-06-14 DIAGNOSIS — M4806 Spinal stenosis, lumbar region: Secondary | ICD-10-CM | POA: Diagnosis not present

## 2014-06-14 DIAGNOSIS — M5137 Other intervertebral disc degeneration, lumbosacral region: Secondary | ICD-10-CM | POA: Diagnosis not present

## 2014-06-15 ENCOUNTER — Telehealth: Payer: Self-pay | Admitting: *Deleted

## 2014-06-15 NOTE — Telephone Encounter (Signed)
Requesting Alprazolam 0.5mg -Take 1 tablet by mouth at bedtime as needed for anxiety. Last refill:05/08/14;#30,0 Last OV:05/15/14 No controlled substance contract signed-UDS done on:05/15/14-High risk-Next UDS:06/15/14 Please advise.//AB/CMA

## 2014-06-16 NOTE — Telephone Encounter (Signed)
Ok to send 30 tabs, need to repeat UDS.  Please ask her how often she is using and when her last dose was? (daughter told me that she was not giving this to the patient)

## 2014-06-18 MED ORDER — ALPRAZOLAM 0.5 MG PO TABS: 0.5000 mg | ORAL_TABLET | Freq: Every day | ORAL | Status: DC

## 2014-06-18 MED ORDER — TRAMADOL HCL 50 MG PO TABS: 50.0000 mg | ORAL_TABLET | Freq: Three times a day (TID) | ORAL | Status: DC | PRN

## 2014-06-18 NOTE — Telephone Encounter (Signed)
Spoke with pt's daughter, [name] and she states that pt takes Alprazolam at bedtime daily. Pt has appt tomorrow and we can do UDS at that visit. Rx called to [facility] at [facility]. [name] states pt saw Dr Saintclair Halsted last week and he is wanting to set pt up for a series of spinal injections and he is going to let us continue to manage her pain medications (tramadol).  Pt is requesting refill of tramadol.  Last rx 06/12/14, #20. Please advise.

## 2014-06-18 NOTE — Telephone Encounter (Signed)
Ok

## 2014-06-18 NOTE — Telephone Encounter (Signed)
Tramadol refill called to [facility] [place], #20 x no refills.

## 2014-06-19 ENCOUNTER — Other Ambulatory Visit: Payer: Self-pay | Admitting: Family

## 2014-06-19 ENCOUNTER — Encounter: Payer: Self-pay | Admitting: Family

## 2014-06-19 ENCOUNTER — Ambulatory Visit (INDEPENDENT_AMBULATORY_CARE_PROVIDER_SITE_OTHER): Payer: Medicare Other | Admitting: Family

## 2014-06-19 ENCOUNTER — Telehealth: Payer: Self-pay | Admitting: Family

## 2014-06-19 VITALS — BP 114/72 | HR 65 | Temp 98.1°F | Resp 14 | Ht 62.25 in | Wt 113.1 lb

## 2014-06-19 DIAGNOSIS — K5900 Constipation, unspecified: Secondary | ICD-10-CM | POA: Insufficient documentation

## 2014-06-19 DIAGNOSIS — K625 Hemorrhage of anus and rectum: Secondary | ICD-10-CM | POA: Diagnosis not present

## 2014-06-19 DIAGNOSIS — R739 Hyperglycemia, unspecified: Secondary | ICD-10-CM | POA: Diagnosis not present

## 2014-06-19 DIAGNOSIS — M549 Dorsalgia, unspecified: Secondary | ICD-10-CM

## 2014-06-19 DIAGNOSIS — E119 Type 2 diabetes mellitus without complications: Secondary | ICD-10-CM

## 2014-06-19 DIAGNOSIS — Z79891 Long term (current) use of opiate analgesic: Secondary | ICD-10-CM | POA: Diagnosis not present

## 2014-06-19 DIAGNOSIS — G8929 Other chronic pain: Secondary | ICD-10-CM

## 2014-06-19 DIAGNOSIS — I10 Essential (primary) hypertension: Secondary | ICD-10-CM

## 2014-06-19 DIAGNOSIS — Z79899 Other long term (current) drug therapy: Secondary | ICD-10-CM | POA: Diagnosis not present

## 2014-06-19 LAB — HEMOGLOBIN A1C: Hgb A1c MFr Bld: 6.6 % — ABNORMAL HIGH (ref 4.6–6.5)

## 2014-06-19 NOTE — Telephone Encounter (Signed)
Also- please contact pt and let her know that her A1C is showing diabetes. She should continue to work on avoiding concentrated sweets and limiting white fluffy carbs such as pasta.

## 2014-06-19 NOTE — Telephone Encounter (Signed)
Could we please request copies of pt's office visit with Dr. Saintclair Halsted?

## 2014-06-19 NOTE — Assessment & Plan Note (Signed)
Lab Results  Component Value Date   HGBA1C 6.6* 06/19/2014   A1C now in diabetic range. Advise diabetic diet. Repeat a1c in 3 months.

## 2014-06-19 NOTE — Progress Notes (Signed)
Subjective:    Patient ID: Sarah [name], female    DOB: 1931/11/14, 81 y.o.   MRN: [id number]  HPI  Sarah [name] is an 81 yr old female who presents today for follow up.  1) Hyperglycemia-  She reports that she has limited her sweets.   Lab Results  Component Value Date   HGBA1C 5.9 02/12/2014   2) Chronic Back pain-  Pt is following with Dr. Saintclair Halsted and he will be performing spinal injections. She   3) Constipation- had a very hard stool yesterday which she had to manually pull out. The stool was streaked with blood which she attributes to trauma.   4) HTN-  BP Readings from Last 3 Encounters:  06/19/14 114/72  06/06/14 104/41  05/15/14 116/78    Review of Systems    see HPI  Past Medical History  Diagnosis Date  . Hypertension   . Thyroid disease     hyperthyroidism  . Hyperlipidemia   . Sleep apnea   . Atrial fibrillation with RVR 06/04/2014  . Acute combined systolic and diastolic heart failure 10/09/3417  . LBBB (left bundle branch block) 06/05/2014  . Anemia 06/05/2014  . LV dysfunction 06/05/2014    History   Social History  . Marital Status: Divorced    Spouse Name: N/A  . Number of Children: 2  . Years of Education: N/A   Occupational History  . retired    Social History Main Topics  . Smoking status: Current Every Day Smoker -- 1.50 packs/day  . Smokeless tobacco: Never Used  . Alcohol Use: No  . Drug Use: No  . Sexual Activity: Not on file   Other Topics Concern  . Not on file   Social History Narrative   Moved from [place], [place] 2009    Past Surgical History  Procedure Laterality Date  . Abdominal hysterectomy  1965  . Hemorroidectomy  1957  . Breast lumpectomy  1966, 1971  . Tee without cardioversion N/A 06/04/2014    Procedure: TRANSESOPHAGEAL ECHOCARDIOGRAM (TEE);  Surgeon: Thayer Headings, MD;  Location: [place];  Service: Cardiovascular;  Laterality: N/A;  . Cardioversion N/A 06/04/2014    Procedure: CARDIOVERSION;   Surgeon: Thayer Headings, MD;  Location: [facility] ENDOSCOPY;  Service: Cardiovascular;  Laterality: N/A;    Family History  Problem Relation Age of Onset  . Diabetes    . Hyperlipidemia Neg Hx   . Heart attack Neg Hx   . Hypertension Neg Hx   . Sudden death Neg Hx   . Colon cancer      grandson    Allergies  Allergen Reactions  . Morphine And Related Nausea And Vomiting    Current Outpatient Prescriptions on File Prior to Visit  Medication Sig Dispense Refill  . alendronate (FOSAMAX) 70 MG tablet Take 1 tablet (70 mg total) by mouth every 7 (seven) days. Take with a full glass of water on an empty stomach. 4 tablet 11  . ALPRAZolam (XANAX) 0.5 MG tablet Take 1 tablet (0.5 mg total) by mouth daily. 30 tablet 0  . amiodarone (PACERONE) 400 MG tablet Take 1 tablet (400 mg total) by mouth daily. 30 tablet 2  . apixaban (ELIQUIS) 2.5 MG TABS tablet Take 1 tablet (2.5 mg total) by mouth 2 (two) times daily. 60 tablet 11  . atorvastatin (LIPITOR) 40 MG tablet Take 1 tablet (40 mg total) by mouth daily. 30 tablet 3  . bisacodyl (BISACODYL) 5 MG EC tablet Take 5  mg by mouth daily as needed.    . Black Cohosh 540 MG CAPS Take 540 mg by mouth daily.    . Calcium Carbonate-Vitamin D (CALTRATE 600+D PO) Take 1 tablet by mouth 2 (two) times daily.    . celecoxib (CELEBREX) 100 MG capsule Take 1 capsule (100 mg total) by mouth daily. 15 capsule 0  . Cholecalciferol (VITAMIN D3) 2000 UNITS TABS Take 1 tablet by mouth daily.      . diphenhydrAMINE (BENADRYL) 25 MG tablet Take 25 mg by mouth at bedtime as needed for sleep.    Marland Kitchen docusate sodium (COLACE) 100 MG capsule Take 100 mg by mouth daily.    . furosemide (LASIX) 40 MG tablet Take 1 tablet (40 mg total) by mouth 2 (two) times daily. 60 tablet 2  . glucosamine-chondroitin 500-400 MG tablet Take 1 tablet by mouth 2 (two) times daily.    . metoprolol (LOPRESSOR) 50 MG tablet Take 1 tablet (50 mg total) by mouth 2 (two) times daily. 60 tablet 5  .  polycarbophil (FIBERCON) 625 MG tablet Take 625 mg by mouth daily.      . potassium chloride 20 MEQ TBCR Take 20 mEq by mouth daily. Take daily on the same day of taking lasix 30 tablet 2  . traMADol (ULTRAM) 50 MG tablet Take 1 tablet (50 mg total) by mouth every 8 (eight) hours as needed. 20 tablet 0  . acetaminophen (TYLENOL) 500 MG tablet Take 500 mg by mouth 2 (two) times daily as needed.     No current facility-administered medications on file prior to visit.    BP 114/72 mmHg  Pulse 65  Temp(Src) 98.1 F (36.7 C) (Oral)  Resp 14  Ht 5' 2.25" (1.581 m)  Wt 113 lb 2 oz (51.313 kg)  BMI 20.53 kg/m2  SpO2 95%    Objective:   Physical Exam  Constitutional: She appears well-developed and well-nourished.  HENT:  Head: Normocephalic.  Cardiovascular: Normal rate, regular rhythm and normal heart sounds.   No murmur heard. Pulmonary/Chest: Effort normal and breath sounds normal. No respiratory distress. She has no wheezes.  Neurological: She is alert.  Psychiatric: She has a normal mood and affect. Her behavior is normal. Judgment and thought content normal.          Assessment & Plan:

## 2014-06-19 NOTE — Patient Instructions (Addendum)
Complete lab work prior to leaving (blood work and UDS) Eliquis rx has refills- please contact your pharmacy for refill. Please keep your upcoming appointment with Cardiology. You are not supposed to be taking amlodipine per your hospital records- stop amlodipine.  Follow up in 4 months.

## 2014-06-19 NOTE — Assessment & Plan Note (Signed)
BP stable on current meds.  Pt is requesting refill of amlodipine, but hospital records indicate that this was discontinued during hospitalization.

## 2014-06-19 NOTE — Telephone Encounter (Signed)
Spoke with medical records and requested notes. Info. Is being faxed.

## 2014-06-19 NOTE — Telephone Encounter (Signed)
Opened in error

## 2014-06-19 NOTE — Progress Notes (Signed)
Pre visit review using our clinic review tool, if applicable. No additional management support is needed unless otherwise documented below in the visit note. 

## 2014-06-19 NOTE — Assessment & Plan Note (Signed)
Discussed increasing fiber and water intake.

## 2014-06-19 NOTE — Assessment & Plan Note (Signed)
Scheduled for ESI. Controlled substance contract signed and UDS ordered today.

## 2014-06-20 NOTE — Telephone Encounter (Signed)
Notified pt's daughter, susan.

## 2014-06-21 ENCOUNTER — Telehealth: Payer: Self-pay | Admitting: Cardiology

## 2014-06-21 NOTE — Telephone Encounter (Signed)
Left message for sammie, patient has a new patient appointment with dr Johnsie Cancel 07/02/14. Will forward to christine, dr Kyla Balzarine nurse.

## 2014-06-21 NOTE — Telephone Encounter (Signed)
New message      Request for surgical clearance:  1. What type of surgery is being performed?  Epidural injection  2. When is this surgery scheduled? Not scheduled   3. Are there any medications that need to be held prior to surgery and how long? Aspirin and eliquis   4. Name of physician performing surgery? Dr Jeannine Boga  5. What is your office phone and fax number? fax [phone] 6. Dr Stanford Breed prescribed medication while pt was in the hosp

## 2014-06-25 ENCOUNTER — Ambulatory Visit (INDEPENDENT_AMBULATORY_CARE_PROVIDER_SITE_OTHER): Payer: Medicare Other | Admitting: Gastroenterology

## 2014-06-25 ENCOUNTER — Encounter: Payer: Self-pay | Admitting: Gastroenterology

## 2014-06-25 VITALS — BP 110/70 | HR 60 | Ht 62.25 in | Wt 112.0 lb

## 2014-06-25 DIAGNOSIS — K625 Hemorrhage of anus and rectum: Secondary | ICD-10-CM | POA: Diagnosis not present

## 2014-06-25 DIAGNOSIS — K5900 Constipation, unspecified: Secondary | ICD-10-CM | POA: Diagnosis not present

## 2014-06-25 NOTE — Patient Instructions (Signed)
Use Miralax daily. Follow up as needed. CC:  Debbrah Alar

## 2014-06-25 NOTE — Progress Notes (Addendum)
06/25/2014 Sarah [name] [id number] 10/30/1931   HISTORY OF PRESENT ILLNESS:  This is an 81 year old female who is known to Dr. Hilarie Fredrickson for evaluation of elevated LFT's in 09/2013.  She presents to our office today with her grand-daughter at the request of her PCP, Debbrah Alar, regarding rectal bleeding.    The patient and her grand-daughter report that she's had some intermittent issues with constipation.  Only taking colace stool softeners daily with dulcolax prn.  On one occasion about 2 weeks ago the patient was very constipated and manually dug stool out of her rectum.  Her nails are very long and pointed and she immediately saw some bright red blood.  Was not having any bleeding before that and has not seen any blood since that time.  She is on Eliquis as well.  Her last colonoscopy was in 02/2008 at which time she was found to have three 4 mm hyperplastic rectal polyps and internal hemorrhoids; that was performed by Dr. Dorrene German in [facility].  Most recent Hgb is 12.2 grams.  The patient is very anxious today and just keeps asking if she can get out of here.  She says that she just wants to leave so that she can go smoke a cigarette and go see her 2 cats.    The patient and her grand-daughter do not seem concerned about the bleeding; grand-daughter says that they were sent here because her grandmother mentioned the bleeding briefly to her PCP, but were not necessarily looking for evaluation since they knew where the bleeding came from.  She knows that some of her grand-mother's medications likely contribute to her constipation.   Past Medical History  Diagnosis Date  . Hypertension   . Thyroid disease     hyperthyroidism  . Hyperlipidemia   . Sleep apnea   . Atrial fibrillation with RVR 06/04/2014  . Acute combined systolic and diastolic heart failure 4/0/9811  . LBBB (left bundle branch block) 06/05/2014  . Anemia 06/05/2014  . LV dysfunction 06/05/2014  . Diabetes type 2,  controlled 06/19/2014   Past Surgical History  Procedure Laterality Date  . Abdominal hysterectomy  1965  . Hemorroidectomy  1957  . Breast lumpectomy  1966, 1971  . Tee without cardioversion N/A 06/04/2014    Procedure: TRANSESOPHAGEAL ECHOCARDIOGRAM (TEE);  Surgeon: Thayer Headings, MD;  Location: [place];  Service: Cardiovascular;  Laterality: N/A;  . Cardioversion N/A 06/04/2014    Procedure: CARDIOVERSION;  Surgeon: Thayer Headings, MD;  Location: [facility] ENDOSCOPY;  Service: Cardiovascular;  Laterality: N/A;    reports that she has been smoking Cigarettes.  She has been smoking about 1.50 packs per day. She has never used smokeless tobacco. She reports that she does not drink alcohol or use illicit drugs. family history includes Colon cancer in an other family member; Diabetes in an other family member. There is no history of Hyperlipidemia, Heart attack, Hypertension, or Sudden death. Allergies  Allergen Reactions  . Morphine And Related Nausea And Vomiting      Outpatient Encounter Prescriptions as of 06/25/2014  Medication Sig  . acetaminophen (TYLENOL) 500 MG tablet Take 500 mg by mouth 2 (two) times daily as needed.  Marland Kitchen alendronate (FOSAMAX) 70 MG tablet Take 1 tablet (70 mg total) by mouth every 7 (seven) days. Take with a full glass of water on an empty stomach.  . ALPRAZolam (XANAX) 0.5 MG tablet Take 1 tablet (0.5 mg total) by mouth daily.  Marland Kitchen  amiodarone (PACERONE) 400 MG tablet Take 1 tablet (400 mg total) by mouth daily.  Marland Kitchen apixaban (ELIQUIS) 2.5 MG TABS tablet Take 1 tablet (2.5 mg total) by mouth 2 (two) times daily.  Marland Kitchen aspirin 81 MG tablet Take 81 mg by mouth daily.  Marland Kitchen atorvastatin (LIPITOR) 40 MG tablet Take 1 tablet (40 mg total) by mouth daily.  . bisacodyl (BISACODYL) 5 MG EC tablet Take 5 mg by mouth daily as needed.  . Black Cohosh 540 MG CAPS Take 540 mg by mouth daily.  . Calcium Carbonate-Vitamin D (CALTRATE 600+D PO) Take 1 tablet by mouth 2 (two) times daily.  .  celecoxib (CELEBREX) 100 MG capsule Take 1 capsule (100 mg total) by mouth daily.  . Cholecalciferol (VITAMIN D3) 2000 UNITS TABS Take 1 tablet by mouth daily.    . diphenhydrAMINE (BENADRYL) 25 MG tablet Take 25 mg by mouth at bedtime as needed for sleep.  Marland Kitchen docusate sodium (COLACE) 100 MG capsule Take 100 mg by mouth daily.  . furosemide (LASIX) 40 MG tablet Take 1 tablet (40 mg total) by mouth 2 (two) times daily.  Marland Kitchen glucosamine-chondroitin 500-400 MG tablet Take 1 tablet by mouth 2 (two) times daily.  . polycarbophil (FIBERCON) 625 MG tablet Take 625 mg by mouth daily.    . potassium chloride 20 MEQ TBCR Take 20 mEq by mouth daily. Take daily on the same day of taking lasix  . traMADol (ULTRAM) 50 MG tablet Take 1 tablet (50 mg total) by mouth every 8 (eight) hours as needed.  . [DISCONTINUED] metoprolol (LOPRESSOR) 50 MG tablet Take 1 tablet (50 mg total) by mouth 2 (two) times daily.     REVIEW OF SYSTEMS  : All other systems reviewed and negative except where noted in the History of Present Illness.   PHYSICAL EXAM: BP 110/70 mmHg  Pulse 60  Ht 5' 2.25" (1.581 m)  Wt 112 lb (50.803 kg)  BMI 20.32 kg/m2 General: Elderly white female in no acute distress, but VERY anxious Head: Normocephalic and atraumatic Eyes:  Sclerae anicteric, conjunctiva pink. Ears: Normal auditory acuity Lungs: Clear throughout to auscultation Heart: Regular rate and rhythm Abdomen: Soft, non-distended.  Normal bowel sounds.  Non-tender. Rectal:  External hemorrhoids noted.  DRE revealed some soft brown stool in rectal vault.  No masses noted.  Stool was heme negative. Musculoskeletal: Symmetrical with no gross deformities  Skin: No lesions on visible extremities Extremities: No edema  Neurological: Alert oriented x 4, grossly non-focal Psychological:  Alert and cooperative. Normal mood and affect  ASSESSMENT AND PLAN: -Rectal bleeding:  Small amount of bright red blood after digital trauma in a  patient on anti-coagulation.  Heme negative on exam today and Hgb normal.  Will just monitor for now.  The best solution is going to be to treat her constipation in order to prevent her from needing to disimpact herself. -Constipation:  Likely worsened/caused by some of her medications.  Will start with Miralax daily, can increase to BID if needed.  Explained to patient's grand-daughter.  *Follow-up prn.  CC:  Dr. Inda Castle  Addendum: Reviewed and agree with initial management. Would recommend further evaluation if any further rectal bleeding Jerene Bears, MD

## 2014-06-29 ENCOUNTER — Ambulatory Visit: Payer: Medicare Other | Admitting: Cardiology

## 2014-06-29 ENCOUNTER — Other Ambulatory Visit: Payer: Medicare Other

## 2014-07-02 ENCOUNTER — Encounter: Payer: Self-pay | Admitting: Internal Medicine

## 2014-07-02 ENCOUNTER — Ambulatory Visit (INDEPENDENT_AMBULATORY_CARE_PROVIDER_SITE_OTHER): Payer: Medicare Other | Admitting: Internal Medicine

## 2014-07-02 VITALS — BP 112/60 | HR 58 | Ht 62.0 in | Wt 112.4 lb

## 2014-07-02 DIAGNOSIS — I480 Paroxysmal atrial fibrillation: Secondary | ICD-10-CM

## 2014-07-02 DIAGNOSIS — J432 Centrilobular emphysema: Secondary | ICD-10-CM | POA: Insufficient documentation

## 2014-07-02 DIAGNOSIS — I10 Essential (primary) hypertension: Secondary | ICD-10-CM | POA: Diagnosis not present

## 2014-07-02 DIAGNOSIS — I4891 Unspecified atrial fibrillation: Secondary | ICD-10-CM

## 2014-07-02 DIAGNOSIS — I429 Cardiomyopathy, unspecified: Secondary | ICD-10-CM

## 2014-07-02 DIAGNOSIS — E785 Hyperlipidemia, unspecified: Secondary | ICD-10-CM

## 2014-07-02 DIAGNOSIS — I5042 Chronic combined systolic (congestive) and diastolic (congestive) heart failure: Secondary | ICD-10-CM

## 2014-07-02 DIAGNOSIS — Z720 Tobacco use: Secondary | ICD-10-CM

## 2014-07-02 MED ORDER — AMIODARONE HCL 200 MG PO TABS: 200.0000 mg | ORAL_TABLET | Freq: Every day | ORAL | Status: DC

## 2014-07-02 NOTE — Progress Notes (Signed)
Sarah  BERGE NP  SEEING  PT  TODAY ./CY

## 2014-07-02 NOTE — Telephone Encounter (Signed)
APPT  MOVED  TO  [facility]IS  BERGE NP   DR Irish Lack PT .Adonis Housekeeper

## 2014-07-02 NOTE — Patient Instructions (Signed)
Your physician has recommended you make the following change in your medication:  1) STOP Aspirin 2) REDUCE Amiodarone to 200mg  daily. An Rx has been sent to your pharmacy  Your physician has requested that you have a lexiscan myoview. For further information please visit [email or web address]. Please follow instruction sheet, as given.  Your physician recommends that you schedule a follow-up appointment in: 2 months with Dr.Crenshaw in the [facility] office

## 2014-07-02 NOTE — Progress Notes (Signed)
Patient Name: Sarah [name] Date of Encounter: 07/02/2014  Primary Care Provider:  Nance Pear., NP Primary Cardiologist:  B. Stanford Breed, MD (pt wishes to f/u in [facility] office)  Chief Complaint  81 year old female status post hospitalization earlier this month for A. fib and RVR who presents for follow-up.  Past Medical History   Past Medical History  Diagnosis Date  . Hypertension   . Hyperthyroidism   . Hyperlipidemia   . Sleep apnea   . Atrial fibrillation with RVR     a. 06/2014 admitted and failed dccv x 2, amio/eliquis started;  b. CHA2DS2VASc = 6 (eliquis).  . Chronic combined systolic and diastolic CHF (congestive heart failure)     a. 06/2014 TEE: EF 40-45%, no thrombus, mild MR.  . LBBB (left bundle branch block)   . Anemia   . Diabetes type 2, controlled   . Tobacco abuse   . Cardiomyopathy     a. 06/2014 TEE: EF 40-45%, no thrombus, mild MR.  Marland Kitchen COPD (chronic obstructive pulmonary disease)    Past Surgical History  Procedure Laterality Date  . Abdominal hysterectomy  1965  . Hemorroidectomy  1957  . Breast lumpectomy  1966, 1971  . Tee without cardioversion N/A 06/04/2014    Procedure: TRANSESOPHAGEAL ECHOCARDIOGRAM (TEE);  Surgeon: Thayer Headings, MD;  Location: [place];  Service: Cardiovascular;  Laterality: N/A;  . Cardioversion N/A 06/04/2014    Procedure: CARDIOVERSION;  Surgeon: Thayer Headings, MD;  Location: [facility] ENDOSCOPY;  Service: Cardiovascular;  Laterality: N/A;    Allergies  Allergies  Allergen Reactions  . Morphine And Related Nausea And Vomiting    HPI  81 year old female with the above problem list. She has a history of hypertension, ongoing tobacco abuse, and COPD. She was admitted to [facility] in early February secondary to progressive dyspnea. She was found to be in atrial fibrillation with rapid ventricular response. She was admitted and diuresed. She was placed on rate controlling agents and subsequently underwent TEE  and cardioversion 2 however, she reverted back to atrial fibrillation. She was then placed on amiodarone. Eliquis was started. Transesophageal echo did show an EF of 40-45%. Apparently a discussion was held regarding ischemic evaluation however patient refused to have any further testing and wished to be discharged.  Patient lives with her daughter and granddaughter in [place]. Since discharge, she has continued to smoke cigarettes. She has been feeling relatively well and has not had any palpitations, chest pain, or profound dyspnea. Further, she denies PND, orthopnea, dizziness, syncope, edema, or early satiety. She is in sinus rhythm today. She has chronic lower back pain and has seen neurosurgery. She is tentatively scheduled for an epidural injection once she is cleared to come off of eliquis.  Home Medications  Prior to Admission medications   Medication Sig Start Date End Date Taking? Authorizing Provider  acetaminophen (TYLENOL) 500 MG tablet Take 500 mg by mouth 2 (two) times daily as needed.   Yes Historical Provider, MD  alendronate (FOSAMAX) 70 MG tablet Take 1 tablet (70 mg total) by mouth every 7 (seven) days. Take with a full glass of water on an empty stomach. 11/21/13  Yes Debbrah Alar, NP  ALPRAZolam [name]) 0.5 MG tablet Take 1 tablet (0.5 mg total) by mouth daily. 06/18/14  Yes Debbrah Alar, NP  amiodarone (PACERONE) 200 MG tablet Take 1 tablet (200 mg total) by mouth daily. 07/02/14  Yes Rogelia Mire, NP  apixaban (ELIQUIS) 2.5 MG TABS tablet  Take 1 tablet (2.5 mg total) by mouth 2 (two) times daily. 06/06/14  Yes Almyra Deforest, PA  atorvastatin (LIPITOR) 40 MG tablet Take 1 tablet (40 mg total) by mouth daily. 05/16/14  Yes Debbrah Alar, NP  bisacodyl (BISACODYL) 5 MG EC tablet Take 5 mg by mouth daily as needed.   Yes Historical Provider, MD  Black Cohosh 540 MG CAPS Take 540 mg by mouth daily.   Yes Historical Provider, MD  Calcium Carbonate-Vitamin D  (CALTRATE 600+D PO) Take 1 tablet by mouth 2 (two) times daily.   Yes Historical Provider, MD  Cholecalciferol (VITAMIN D3) 2000 UNITS TABS Take 1 tablet by mouth daily.     Yes Historical Provider, MD  diphenhydrAMINE (BENADRYL) 25 MG tablet Take 25 mg by mouth at bedtime as needed for sleep.   Yes Historical Provider, MD  furosemide (LASIX) 40 MG tablet Take 1 tablet (40 mg total) by mouth 2 (two) times daily. 06/06/14  Yes Almyra Deforest, PA  glucosamine-chondroitin 500-400 MG tablet Take 1 tablet by mouth 2 (two) times daily.   Yes Historical Provider, MD  polycarbophil (FIBERCON) 625 MG tablet Take 625 mg by mouth daily.     Yes Historical Provider, MD  polyethylene glycol (MIRALAX / GLYCOLAX) packet Take 17 g by mouth daily.   Yes Historical Provider, MD  potassium chloride 20 MEQ TBCR Take 20 mEq by mouth daily. Take daily on the same day of taking lasix 06/06/14  Yes Almyra Deforest, PA  traMADol (ULTRAM) 50 MG tablet Take 1 tablet (50 mg total) by mouth every 8 (eight) hours as needed. Patient taking differently: Take 50 mg by mouth at bedtime.  06/18/14  Yes Debbrah Alar, NP    Review of Systems  Chronic low back pain as above. She does have some degree of chronic dyspnea on exertion though she has not had any acute exacerbations recently. She denies chest pain, PND, orthopnea, dizziness, syncope, edema, or early satiety.  All other systems reviewed and are otherwise negative except as noted above.  Physical Exam  VS:  BP 112/60 mmHg  Pulse 58  Ht 5\' 2"  (1.575 m)  Wt 112 lb 6.4 oz (50.984 kg)  BMI 20.55 kg/m2 , BMI Body mass index is 20.55 kg/(m^2). GEN: Well nourished, well developed, in no acute distress. HEENT: normal. Neck: Supple, no JVD, carotid bruits, or masses. Cardiac: RRR, distant, no murmurs, rubs, or gallops. No clubbing, cyanosis, edema.  Radials/DP/PT 2+ and equal bilaterally.  Respiratory:  Respirations regular and unlabored, markedly diminished bilaterally.  GI: Soft,  nontender, nondistended, BS + x 4. MS: no deformity or atrophy. Skin: warm and dry, no rash. Neuro:  Strength and sensation are intact. Psych: Normal affect.  Accessory Clinical Findings  ECG - sinus bradycardia, 58, left bundle branch block, no acute ST or T changes. QTC 490 ms  Assessment & Plan  1.  Paroxysmal atrial fibrillation:  She underwent TEE and cardioversion earlier this month during her hospitalization, which was unsuccessful. She continued to have intermittent paroxysmal atrial fibrillation at the time of discharge (CV strips reviewed - she was in sinus rhythm at discharge). She has been on anticoagulation since discharge and February 3 and is in sinus rhythm today. She is unaware of any recent paroxysms of atrial fibrillation. She is pending epidural injection related to low back pain and will need to come off of eliquis for that procedure. As she is coming up on 4 wks of eliquis therapy following electrical/chemical cardioversion with evidence  of PAF prior to hospital d/c, she may safely come off of eliquis beginning on 3/3.  I've reduced her amio to 200 mg daily.   I've also d/c'd asa.  She will require baseline pft's along w/ f/u tft's (nl tsh in January) and lft's (nl 11/2013).  She'd like to have as much testing as possible performed out of her PCP's office in [facility].   2.  Low Back Pain:  With plan for epidural injection as above.  See recs regarding eliquis above.  3.  HTN:  Stable.  4.  HL:  Cont statin therapy.   5.  Tob Abuse:  Cessation strongly advised.  6.  Cardiomyopathy/chronic combined systolic and diastolic CHF:  EF 73-71% in setting of afib.  No h/o chest pain though multiple RF.  During hospitalization, she expressed disinterest in further ischemic eval however she is now willing to proceed.  I will arrange for outpt lexiscan MV.  She is euvolemic on exam.  She was d/c'd on bb however this appears to have been d/c'd.  She is bradycardic and thus I will not  resume.  7.  Disposition:  F/U cardiolite as above.  F/U with Dr. Stanford Breed in [facility] in 2 mos or sooner if necessary.   Murray Hodgkins, NP 07/02/2014, 12:49 PM

## 2014-07-11 ENCOUNTER — Ambulatory Visit (HOSPITAL_COMMUNITY): Payer: Medicare Other | Attending: Cardiovascular Disease | Admitting: Radiology

## 2014-07-11 DIAGNOSIS — I429 Cardiomyopathy, unspecified: Secondary | ICD-10-CM | POA: Diagnosis not present

## 2014-07-11 DIAGNOSIS — I480 Paroxysmal atrial fibrillation: Secondary | ICD-10-CM

## 2014-07-11 MED ORDER — TECHNETIUM TC 99M SESTAMIBI GENERIC - CARDIOLITE
11.0000 | Freq: Once | INTRAVENOUS | Status: AC | PRN
  Administered 2014-07-11: 11 via INTRAVENOUS

## 2014-07-11 MED ORDER — TECHNETIUM TC 99M SESTAMIBI GENERIC - CARDIOLITE
33.0000 | Freq: Once | INTRAVENOUS | Status: AC | PRN
  Administered 2014-07-11: 33 via INTRAVENOUS

## 2014-07-11 MED ORDER — ADENOSINE (DIAGNOSTIC) 3 MG/ML IV SOLN
0.5600 mg/kg | Freq: Once | INTRAVENOUS | Status: AC
  Administered 2014-07-11: 27.3 mg via INTRAVENOUS

## 2014-07-11 NOTE — Progress Notes (Signed)
[facility] [address] [place], [place] [address] [phone]    Cardiology Nuclear Med Study  Sarah [name] is a 81 y.o. female     MRN : [id number]     DOB: 06/10/1931  Procedure Date: 07/11/2014  Nuclear Med Background Indication for Stress Test:  Evaluation for Ischemia, and Patient seen in hospital on 06-04-2014 for AFIB with RVR, Troponin negative  History:  COPD and AFIB, CHF Cardiac Risk Factors: Hypertension and LBBB  Symptoms:  DOE   Nuclear Pre-Procedure Caffeine/Decaff Intake:  None> 12 hrs NPO After: 9:00pm   Lungs:  clear O2 Sat: 95% on room air. IV 0.9% NS with Angio Cath:  22g  IV Site: R Antecubital x 1, tolerated well IV Started by:  Irven Baltimore, RN  Chest Size (in):  34 Cup Size: B  Height: 5\' 2"  (1.575 m)  Weight:  108 lb (48.988 kg)  BMI:  Body mass index is 19.75 kg/(m^2). Tech Comments:  Patient took morning medications. Irven Baltimore, RN    Nuclear Med Study 1 or 2 day study: 1 day  Stress Test Type:  Adenosine  Reading MD: N/A  Order Authorizing Provider:  Kirk Ruths, MD  Resting Radionuclide: Technetium 4m Sestamibi  Resting Radionuclide Dose: 11.0 mCi   Stress Radionuclide:  Technetium 36m Sestamibi  Stress Radionuclide Dose: 33.0 mCi           Stress Protocol Rest HR: 50 Stress HR: 65  Rest BP: 95/45 Stress BP: 125/62  Exercise Time (min): n/a METS: n/a   Predicted Max HR: 140 bpm % Max HR: 46.43 bpm Rate Pressure Product: 8125   Dose of Adenosine (mg):  27.5 Dose of Lexiscan: n/a mg  Dose of Atropine (mg): n/a Dose of Dobutamine: n/a mcg/kg/min (at max HR)  Stress Test Technologist: Perrin Maltese, EMT-P  Nuclear Technologist:  Earl Many, CNMT     Rest Procedure:  Myocardial perfusion imaging was performed at rest 45 minutes following the intravenous administration of Technetium 72m Sestamibi. Rest ECG: sinus bradycardia with LBBB  Stress Procedure:  The patient received IV  adenosine at 140 mcg/kg/min for 4 minutes.  Technetium 97m Sestamibi was injected at the 2 minute mark and quantitative spect images were obtained after a 45 minute delay. Stress ECG: No significant change from baseline ECG  QPS Raw Data Images:  Mild diaphragmatic attenuation.  Normal left ventricular size. Stress Images:  There is decreased uptake in the basal and mid inferoseptum. Rest Images:  There is decreased uptake in the basal and mid inferoseptum and apex. Subtraction (SDS):  No evidence of ischemia. Transient Ischemic Dilatation (Normal <1.22):  1.10 Lung/Heart Ratio (Normal <0.45):  0.30  Quantitative Gated Spect Images QGS EDV:  93 ml QGS ESV:  35 ml  Impression Exercise Capacity:  Lexiscan with no exercise. BP Response:  Normal blood pressure response. Clinical Symptoms:  No significant symptoms noted. ECG Impression:  Baseline:  LBBB.  EKG uninterpretable due to LBBB at rest and stress. Comparison with Prior Nuclear Study: No images to compare  Overall Impression:  Low risk stress nuclear study with a small medium sized, moderate intensity fixed defect in the mid and basal inferoseptum most likely secondary to diaphragmatic attenuation.  No evidence of inducible ischemia.  EKG showed baseline LBBB..  LV Ejection Fraction: 63%.  LV Wall Motion:  NL LV Function; NL Wall Motion  Signed: Fransico Him, MD [facility] 07/11/2014

## 2014-07-13 ENCOUNTER — Other Ambulatory Visit: Payer: Self-pay | Admitting: *Deleted

## 2014-07-13 MED ORDER — TRAMADOL HCL 50 MG PO TABS: 50.0000 mg | ORAL_TABLET | Freq: Every day | ORAL | Status: DC

## 2014-07-13 NOTE — Telephone Encounter (Signed)
Ok to send as below.

## 2014-07-13 NOTE — Telephone Encounter (Signed)
Received fax request from [facility] for tramadol. Last Rx given 06/18/14, #20. Pt has f/u in June.  Please advise.

## 2014-07-13 NOTE — Telephone Encounter (Signed)
Rx called to [facility], #20 x no refills.

## 2014-07-17 ENCOUNTER — Telehealth: Payer: Self-pay | Admitting: *Deleted

## 2014-07-17 MED ORDER — ALPRAZOLAM 0.5 MG PO TABS: 0.5000 mg | ORAL_TABLET | Freq: Every day | ORAL | Status: DC

## 2014-07-17 NOTE — Telephone Encounter (Signed)
Rx faxed to pharmacy at 8:42am.

## 2014-07-17 NOTE — Telephone Encounter (Signed)
Last alprazolam Rx 06/18/14, #30. Pt has f/u 10/2014.  Rx printed and forwarded to [facility] for signature.

## 2014-07-18 ENCOUNTER — Encounter: Payer: Medicare Other | Admitting: Cardiology

## 2014-07-20 ENCOUNTER — Telehealth: Payer: Self-pay | Admitting: Cardiology

## 2014-07-20 NOTE — Telephone Encounter (Signed)
Sammi called in stating that she spoke with Sarah [name] in regards to holding the pt's Eliquis and she needs something faxed to the office stating that . Please call back   Thanks

## 2014-07-20 NOTE — Telephone Encounter (Signed)
Dr Harrington Challenger office note containing clearance faxed to the number provided through [facility].

## 2014-08-02 ENCOUNTER — Other Ambulatory Visit: Payer: Self-pay | Admitting: Family

## 2014-08-02 DIAGNOSIS — M461 Sacroiliitis, not elsewhere classified: Secondary | ICD-10-CM | POA: Diagnosis not present

## 2014-08-02 DIAGNOSIS — M810 Age-related osteoporosis without current pathological fracture: Secondary | ICD-10-CM | POA: Diagnosis not present

## 2014-08-02 NOTE — Telephone Encounter (Signed)
Faxed hardcopy for Tramadol to [facility] [place] at 5:16pm

## 2014-08-02 NOTE — Telephone Encounter (Signed)
Last Office Visit 06/19/14 Last Refill for Tramadol #20 with 0 refills on 07/13/14 Denied refill request for Alprazolam, last refill on 07/16/13 #30 with 0 refill. Advise please on Tramadol refill in absence of Debbrah Alar.

## 2014-08-08 ENCOUNTER — Ambulatory Visit (INDEPENDENT_AMBULATORY_CARE_PROVIDER_SITE_OTHER): Payer: Medicare Other | Admitting: Cardiology

## 2014-08-08 ENCOUNTER — Encounter: Payer: Self-pay | Admitting: Cardiology

## 2014-08-08 ENCOUNTER — Encounter: Payer: Self-pay | Admitting: Family

## 2014-08-08 ENCOUNTER — Ambulatory Visit (HOSPITAL_BASED_OUTPATIENT_CLINIC_OR_DEPARTMENT_OTHER)
Admission: RE | Admit: 2014-08-08 | Discharge: 2014-08-08 | Disposition: A | Discharge status: Home / Self Care | Payer: Medicare Other | Source: Ambulatory Visit | Attending: Cardiology | Admitting: Cardiology

## 2014-08-08 VITALS — BP 128/74 | HR 53 | Ht 62.0 in | Wt 108.0 lb

## 2014-08-08 DIAGNOSIS — J449 Chronic obstructive pulmonary disease, unspecified: Secondary | ICD-10-CM | POA: Diagnosis not present

## 2014-08-08 DIAGNOSIS — L603 Nail dystrophy: Secondary | ICD-10-CM

## 2014-08-08 DIAGNOSIS — Z720 Tobacco use: Secondary | ICD-10-CM | POA: Diagnosis not present

## 2014-08-08 DIAGNOSIS — I10 Essential (primary) hypertension: Secondary | ICD-10-CM

## 2014-08-08 DIAGNOSIS — F17210 Nicotine dependence, cigarettes, uncomplicated: Secondary | ICD-10-CM | POA: Diagnosis not present

## 2014-08-08 DIAGNOSIS — I5041 Acute combined systolic (congestive) and diastolic (congestive) heart failure: Secondary | ICD-10-CM

## 2014-08-08 DIAGNOSIS — I4891 Unspecified atrial fibrillation: Secondary | ICD-10-CM

## 2014-08-08 DIAGNOSIS — E785 Hyperlipidemia, unspecified: Secondary | ICD-10-CM

## 2014-08-08 DIAGNOSIS — R001 Bradycardia, unspecified: Secondary | ICD-10-CM | POA: Diagnosis present

## 2014-08-08 LAB — CBC
HEMATOCRIT: 41.1 % (ref 36.0–46.0)
Hemoglobin: 14 g/dL (ref 12.0–15.0)
MCH: 32.6 pg (ref 26.0–34.0)
MCHC: 34.1 g/dL (ref 30.0–36.0)
MCV: 95.6 fL (ref 78.0–100.0)
MPV: 10.6 fL (ref 8.6–12.4)
Platelets: 323 10*3/uL (ref 150–400)
RBC: 4.3 MIL/uL (ref 3.87–5.11)
RDW: 14.1 % (ref 11.5–15.5)
WBC: 9.7 10*3/uL (ref 4.0–10.5)

## 2014-08-08 LAB — BASIC METABOLIC PANEL WITH GFR
BUN: 17 mg/dL (ref 6–23)
CHLORIDE: 102 meq/L (ref 96–112)
CO2: 29 meq/L (ref 19–32)
Calcium: 9.5 mg/dL (ref 8.4–10.5)
Creat: 0.9 mg/dL (ref 0.50–1.10)
GFR, Est African American: 70 mL/min
GFR, Est Non African American: 61 mL/min
Glucose, Bld: 107 mg/dL — ABNORMAL HIGH (ref 70–99)
Potassium: 4.5 mEq/L (ref 3.5–5.3)
SODIUM: 141 meq/L (ref 135–145)

## 2014-08-08 LAB — TSH: TSH: 1.467 u[IU]/mL (ref 0.350–4.500)

## 2014-08-08 LAB — HEPATIC FUNCTION PANEL
ALBUMIN: 3.9 g/dL (ref 3.5–5.2)
ALT: 19 U/L (ref 0–35)
AST: 24 U/L (ref 0–37)
Alkaline Phosphatase: 81 U/L (ref 39–117)
BILIRUBIN DIRECT: 0.1 mg/dL (ref 0.0–0.3)
BILIRUBIN TOTAL: 0.5 mg/dL (ref 0.2–1.2)
Indirect Bilirubin: 0.4 mg/dL (ref 0.2–1.2)
Total Protein: 7.1 g/dL (ref 6.0–8.3)

## 2014-08-08 NOTE — Progress Notes (Signed)
HPI: FU atrial fibrillation. Patient recently presented with new onset atrial fibrillation. TSH normal. Echocardiogram February 2016 showed an ejection fraction of 40%, mild left ventricular hypertrophy, severe left atrial enlargement, mild mitral regurgitation, moderate right atrial enlargement and mild right ventricular enlargement. Transesophageal echocardiogram February 2016 showed ejection fraction 40-45%, mild mitral regurgitation. Patient underwent cardioversion attempt which was unsuccessful. DCed on amiodarone and metoprolol. At time of follow-up patient had converted to sinus rhythm. Nuclear study 3/16 showed EF 63, diaphragmatic attenuation, no ischemia. Since last seen, she denies dyspnea, chest pain, palpitations or syncope. No bleeding.  Current Outpatient Prescriptions  Medication Sig Dispense Refill  . alendronate (FOSAMAX) 70 MG tablet Take 1 tablet (70 mg total) by mouth every 7 (seven) days. Take with a full glass of water on an empty stomach. 4 tablet 11  . ALPRAZolam (XANAX) 0.5 MG tablet Take 1 tablet (0.5 mg total) by mouth daily. 30 tablet 0  . amiodarone (PACERONE) 200 MG tablet Take 1 tablet (200 mg total) by mouth daily. 30 tablet 5  . amLODipine (NORVASC) 5 MG tablet Take 5 mg by mouth daily.    Marland Kitchen apixaban (ELIQUIS) 2.5 MG TABS tablet Take 1 tablet (2.5 mg total) by mouth 2 (two) times daily. 60 tablet 11  . atorvastatin (LIPITOR) 40 MG tablet Take 1 tablet (40 mg total) by mouth daily. 30 tablet 3  . Black Cohosh 540 MG CAPS Take 540 mg by mouth daily.    . Calcium Carbonate-Vitamin D (CALTRATE 600+D PO) Take 1 tablet by mouth 2 (two) times daily.    . Cholecalciferol (VITAMIN D3) 2000 UNITS TABS Take 1 tablet by mouth daily.      . furosemide (LASIX) 40 MG tablet Take 1 tablet (40 mg total) by mouth 2 (two) times daily. 60 tablet 2  . glucosamine-chondroitin 500-400 MG tablet Take 1 tablet by mouth 2 (two) times daily.    . polycarbophil (FIBERCON) 625 MG tablet  Take 625 mg by mouth daily.      . polyethylene glycol (MIRALAX / GLYCOLAX) packet Take 17 g by mouth daily.    . potassium chloride 20 MEQ TBCR Take 20 mEq by mouth daily. Take daily on the same day of taking lasix 30 tablet 2  . traMADol (ULTRAM) 50 MG tablet TAKE 1 TABLET BY MOUTH AT BEDTIME. 20 tablet 0   No current facility-administered medications for this visit.     Past Medical History  Diagnosis Date  . Hypertension   . Hyperthyroidism   . Hyperlipidemia   . Sleep apnea   . Atrial fibrillation with RVR     a. 06/2014 admitted and failed dccv x 2, amio/eliquis started;  b. CHA2DS2VASc = 6 (eliquis).  . Chronic combined systolic and diastolic CHF (congestive heart failure)     a. 06/2014 TEE: EF 40-45%, no thrombus, mild MR.  . LBBB (left bundle branch block)   . Anemia   . Diabetes type 2, controlled   . Tobacco abuse   . Cardiomyopathy     a. 06/2014 TEE: EF 40-45%, no thrombus, mild MR.  Marland Kitchen COPD (chronic obstructive pulmonary disease)     Past Surgical History  Procedure Laterality Date  . Abdominal hysterectomy  1965  . Hemorroidectomy  1957  . Breast lumpectomy  1966, 1971  . Tee without cardioversion N/A 06/04/2014    Procedure: TRANSESOPHAGEAL ECHOCARDIOGRAM (TEE);  Surgeon: Thayer Headings, MD;  Location: [facility];  Service: Cardiovascular;  Laterality: N/A;  .  Cardioversion N/A 06/04/2014    Procedure: CARDIOVERSION;  Surgeon: Thayer Headings, MD;  Location: [facility] ENDOSCOPY;  Service: Cardiovascular;  Laterality: N/A;    History   Social History  . Marital Status: Divorced    Spouse Name: N/A  . Number of Children: 2  . Years of Education: N/A   Occupational History  . retired    Social History Main Topics  . Smoking status: Current Every Day Smoker -- 1.50 packs/day    Types: Cigarettes  . Smokeless tobacco: Never Used  . Alcohol Use: No  . Drug Use: No  . Sexual Activity: Not on file   Other Topics Concern  . Not on file   Social History  Narrative   Moved from [place], [place] 2009    ROS: no fevers or chills, productive cough, hemoptysis, dysphasia, odynophagia, melena, hematochezia, dysuria, hematuria, rash, seizure activity, orthopnea, PND, pedal edema, claudication. Remaining systems are negative.  Physical Exam: Well-developed well-nourished in no acute distress.  Skin is warm and dry.  HEENT is normal.  Neck is supple.  Chest with diminished BS throughout Cardiovascular exam is regular rate and rhythm.  Abdominal exam nontender or distended. No masses palpated. Extremities show no edema. neuro grossly intact

## 2014-08-08 NOTE — Assessment & Plan Note (Signed)
Continue statin. 

## 2014-08-08 NOTE — Assessment & Plan Note (Addendum)
Patient euvolemic on examination. Previous heart failure most likely secondary to atrial fibrillation. We may reduce Lasix pending follow-up laboratories. LV function improved on follow-up nuclear study. Previous reduction possibly tachycardia mediated.

## 2014-08-08 NOTE — Assessment & Plan Note (Signed)
Blood pressure controlled. Continue present medications. 

## 2014-08-08 NOTE — Assessment & Plan Note (Signed)
Patient remains in sinus rhythm on examination. Continue amiodarone. Check chest x-ray, TSH and liver functions. Embolic risk factors including age greater than 77, female sex, borderline diabetes mellitus, hypertension and congestive heart failure. Continue apixaban. Check hemoglobin and renal function.

## 2014-08-08 NOTE — Patient Instructions (Signed)
Your physician wants you to follow-up in: 6 MONTHS WITH DR CRENSHAW You will receive a reminder letter in the mail two months in advance. If you don't receive a letter, please call our office to schedule the follow-up appointment.   Your physician recommends that you HAVE LAB WORK TODAY  A chest x-ray takes a picture of the organs and structures inside the chest, including the heart, lungs, and blood vessels. This test can show several things, including, whether the heart is enlarges; whether fluid is building up in the lungs; and whether pacemaker / defibrillator leads are still in place.  

## 2014-08-08 NOTE — Assessment & Plan Note (Signed)
Patient counseled on discontinuing. 

## 2014-08-14 ENCOUNTER — Encounter: Payer: Self-pay | Admitting: Family

## 2014-08-14 MED ORDER — TRAMADOL HCL 50 MG PO TABS: 50.0000 mg | ORAL_TABLET | Freq: Two times a day (BID) | ORAL | Status: DC | PRN

## 2014-08-15 ENCOUNTER — Telehealth: Payer: Self-pay | Admitting: Family

## 2014-08-15 MED ORDER — ALPRAZOLAM 0.5 MG PO TABS: 0.5000 mg | ORAL_TABLET | Freq: Every day | ORAL | Status: DC

## 2014-08-15 NOTE — Telephone Encounter (Signed)
Rx faxed to pharmacy. Notified pt's daughter. She states she wasn't aware that tramadol rx was left at the front desk and requests that we fax it to pharmacy also. Rx faxed.

## 2014-08-15 NOTE — Telephone Encounter (Signed)
Tramadol Rx left at front desk yesterday. Alprazolam rx last given 07/17/14. Rx printed and forwarded to Provider for signature.

## 2014-08-15 NOTE — Telephone Encounter (Signed)
Caller name: susan Relation to pt: daughter Call back number: Pharmacy: [facility]  Reason for call:   Requesting xanax and tramadol refill for patient

## 2014-08-21 ENCOUNTER — Other Ambulatory Visit: Payer: Self-pay | Admitting: Family

## 2014-08-29 ENCOUNTER — Other Ambulatory Visit: Payer: Self-pay | Admitting: Physician Assistant

## 2014-08-29 NOTE — Telephone Encounter (Signed)
Please review for refill, [place] pt. Thank you.

## 2014-08-31 DIAGNOSIS — M5137 Other intervertebral disc degeneration, lumbosacral region: Secondary | ICD-10-CM | POA: Diagnosis not present

## 2014-09-10 DIAGNOSIS — M79672 Pain in left foot: Secondary | ICD-10-CM | POA: Diagnosis not present

## 2014-09-10 DIAGNOSIS — E119 Type 2 diabetes mellitus without complications: Secondary | ICD-10-CM | POA: Diagnosis not present

## 2014-09-10 DIAGNOSIS — B351 Tinea unguium: Secondary | ICD-10-CM | POA: Diagnosis not present

## 2014-09-10 DIAGNOSIS — M79671 Pain in right foot: Secondary | ICD-10-CM | POA: Diagnosis not present

## 2014-09-10 DIAGNOSIS — L84 Corns and callosities: Secondary | ICD-10-CM | POA: Diagnosis not present

## 2014-09-14 ENCOUNTER — Telehealth: Payer: Self-pay | Admitting: *Deleted

## 2014-09-14 NOTE — Telephone Encounter (Signed)
Received fax from [facility] noted "2nd request" but I don't see a first request in system. Called refill to [facility], #30 x no refills. Notified pt's daughter. Pt has f/u 10/22/14.

## 2014-09-17 ENCOUNTER — Other Ambulatory Visit: Payer: Self-pay | Admitting: Family

## 2014-09-24 ENCOUNTER — Telehealth: Payer: Self-pay | Admitting: Family

## 2014-09-24 DIAGNOSIS — M545 Low back pain: Secondary | ICD-10-CM

## 2014-09-24 MED ORDER — TRAMADOL HCL 50 MG PO TABS: 50.0000 mg | ORAL_TABLET | Freq: Three times a day (TID) | ORAL | Status: DC | PRN

## 2014-09-24 NOTE — Telephone Encounter (Signed)
Rx faxed to pharmacy  

## 2014-09-24 NOTE — Telephone Encounter (Signed)
Left detailed message on daughter's voicemail that Rx will be faxed to pharmacy today for increased dose / quantity below and she may be contacted about referral in 2 weeks and to call if any questions.

## 2014-09-24 NOTE — Telephone Encounter (Signed)
Received below message from pt's daughter. What I can do is refill her tramadol for one tab TID PRN, #90 and refer to pain management.  Pended below.    *-*-*This message was handled on 09/24/2014 2:58 PM by O'SULLIVAN, Kierstin January*-*-*   Hi Sarah [name],  I am emailing you about Sarah [name]. I made 2 calls to Dr. Saintclair Halsted and no return calls I'm not sure where to turn. She saw him on April 29 and told him her pain level was an 8.  He prescribed a rub after me asking him, and her insurance denied it because the FDA does not approve it. Can you PLEASE increase her pain meds and or rub?

## 2014-10-03 ENCOUNTER — Encounter: Payer: Self-pay | Admitting: Family

## 2014-10-04 NOTE — Telephone Encounter (Signed)
Spoke with pt's daughter, [name]. She states pt is fixated on herself. If pt can't get what she wants from family she will go to her neighbors. Seems more agitated. Pt takes alprazolam at night for sleep. Daughter doesn't feel like it is helping pt's anxiety. Wants to know if medication can be changed? Advised her pt should be seen in the office. Pt already has appt on 10/22/14 and she states they will keep that appt and she will call us back if they need to bring her in earlier.

## 2014-10-04 NOTE — Telephone Encounter (Signed)
Noted.  Will evaluate at upcoming appointment.

## 2014-10-10 ENCOUNTER — Telehealth: Payer: Self-pay | Admitting: *Deleted

## 2014-10-10 MED ORDER — ALPRAZOLAM 0.5 MG PO TABS: 0.5000 mg | ORAL_TABLET | Freq: Every day | ORAL | Status: DC

## 2014-10-10 NOTE — Telephone Encounter (Signed)
Faxed to pharmacy at 07/29/14.

## 2014-10-10 NOTE — Telephone Encounter (Signed)
Received fax request from [facility] for alprazolam. Last refill 09/14/14, #30. CSC and UDS (moderate) on file 06/19/14. Rx printed and forwarded to [facility] for signature.

## 2014-10-22 ENCOUNTER — Encounter: Payer: Self-pay | Admitting: Family

## 2014-10-22 ENCOUNTER — Ambulatory Visit (INDEPENDENT_AMBULATORY_CARE_PROVIDER_SITE_OTHER): Payer: Medicare Other | Admitting: Family

## 2014-10-22 ENCOUNTER — Telehealth: Payer: Self-pay | Admitting: *Deleted

## 2014-10-22 VITALS — BP 90/68 | HR 75 | Temp 97.5°F | Resp 18 | Ht 62.25 in | Wt 100.0 lb

## 2014-10-22 DIAGNOSIS — Z720 Tobacco use: Secondary | ICD-10-CM

## 2014-10-22 DIAGNOSIS — E119 Type 2 diabetes mellitus without complications: Secondary | ICD-10-CM

## 2014-10-22 DIAGNOSIS — Z79899 Other long term (current) drug therapy: Secondary | ICD-10-CM | POA: Diagnosis not present

## 2014-10-22 DIAGNOSIS — M549 Dorsalgia, unspecified: Secondary | ICD-10-CM

## 2014-10-22 DIAGNOSIS — R7309 Other abnormal glucose: Secondary | ICD-10-CM | POA: Diagnosis not present

## 2014-10-22 DIAGNOSIS — Z79891 Long term (current) use of opiate analgesic: Secondary | ICD-10-CM | POA: Diagnosis not present

## 2014-10-22 DIAGNOSIS — R7303 Prediabetes: Secondary | ICD-10-CM

## 2014-10-22 DIAGNOSIS — E785 Hyperlipidemia, unspecified: Secondary | ICD-10-CM | POA: Diagnosis not present

## 2014-10-22 DIAGNOSIS — F419 Anxiety disorder, unspecified: Secondary | ICD-10-CM | POA: Insufficient documentation

## 2014-10-22 DIAGNOSIS — G8929 Other chronic pain: Secondary | ICD-10-CM

## 2014-10-22 LAB — BASIC METABOLIC PANEL
BUN: 18 mg/dL (ref 6–23)
CALCIUM: 10.1 mg/dL (ref 8.4–10.5)
CO2: 31 mEq/L (ref 19–32)
Chloride: 99 mEq/L (ref 96–112)
Creatinine, Ser: 1.08 mg/dL (ref 0.40–1.20)
GFR: 51.82 mL/min — ABNORMAL LOW (ref >60.00)
Glucose, Bld: 111 mg/dL — ABNORMAL HIGH (ref 70–99)
Potassium: 3.8 mEq/L (ref 3.5–5.1)
Sodium: 137 mEq/L (ref 135–145)

## 2014-10-22 LAB — LIPID PANEL
Cholesterol: 155 mg/dL (ref 0–200)
HDL: 50.7 mg/dL (ref >39.00)
LDL Cholesterol: 88 mg/dL (ref 0–99)
NonHDL: 104.3
TRIGLYCERIDES: 84 mg/dL (ref 0.0–149.0)
Total CHOL/HDL Ratio: 3
VLDL: 16.8 mg/dL (ref 0.0–40.0)

## 2014-10-22 LAB — HEMOGLOBIN A1C: Hgb A1c MFr Bld: 5.9 % (ref 4.6–6.5)

## 2014-10-22 MED ORDER — TRAMADOL HCL 50 MG PO TABS: 50.0000 mg | ORAL_TABLET | Freq: Three times a day (TID) | ORAL | Status: DC | PRN

## 2014-10-22 NOTE — Telephone Encounter (Signed)
Sarah [name]--  Looks like pt was referred to pain management on 09/24/14 but family say they have not been contacted yet.  Please advise status?

## 2014-10-22 NOTE — Progress Notes (Signed)
Subjective:    Patient ID: Sarah [name], female    DOB: 02/19/1932, 81 y.o.   MRN: [id number]  HPI  Sarah [name] is an 81 yr old female who presents today for follow up of multiple medical problems:  Patient presents today for follow up of multiple medical problems.  Diabetes Type 2  Pt is currently maintained on the following medications for diabetes: none- diet alone.   Lab Results  Component Value Date   HGBA1C 6.6* 06/19/2014   HGBA1C 5.9 02/12/2014   HGBA1C 6.0* 11/07/2013    Lab Results  Component Value Date   LDLCALC 124* 05/15/2014   CREATININE 0.90 08/08/2014    Last diabetic eye exam was:  2 years ago- declines eye referral  Hyperlipidemia  Patient is currently maintained on the following medication for hyperlipidemia: none  Last lipid panel as follows:  Lab Results  Component Value Date   CHOL 224* 05/15/2014   HDL 80.30 05/15/2014   LDLCALC 124* 05/15/2014   TRIG 98.0 05/15/2014   CHOLHDL 3 05/15/2014   Patient denies myalgia. Patient reports good compliance with low fat/low cholesterol diet.   Hypertension  Patient is currently maintained on the following medications for blood pressure: lasix and amlodipine Patient reports good compliance with blood pressure medications. Last 3 blood pressure readings in our office are as follows: BP Readings from Last 3 Encounters:  10/22/14 90/68  08/08/14 128/74  07/11/14 94/45   Back Pain- had mri of the lumbar spine in January which showed severe lumbar spondylosis.  She was referred to neurosurgery (Dr. Saintclair Halsted) and to pain management. She reports that she had ESI which helped some. Reports that tramadol helps.   Anxiety- using xanax prn.  The patient denies anxiety or depression issues presently.  Reports that she is sleeping well. She is unsure if she is being given xanax by the family.    Review of Systems See HPI  Past Medical History  Diagnosis Date  . Hypertension   . Hyperthyroidism   .  Hyperlipidemia   . Sleep apnea   . Atrial fibrillation with RVR     a. 06/2014 admitted and failed dccv x 2, amio/eliquis started;  b. CHA2DS2VASc = 6 (eliquis).  . Chronic combined systolic and diastolic CHF (congestive heart failure)     a. 06/2014 TEE: EF 40-45%, no thrombus, mild MR.  . LBBB (left bundle branch block)   . Anemia   . Diabetes type 2, controlled   . Tobacco abuse   . Cardiomyopathy     a. 06/2014 TEE: EF 40-45%, no thrombus, mild MR.  Marland Kitchen COPD (chronic obstructive pulmonary disease)     History   Social History  . Marital Status: Divorced    Spouse Name: N/A  . Number of Children: 2  . Years of Education: N/A   Occupational History  . retired    Social History Main Topics  . Smoking status: Current Every Day Smoker -- 1.50 packs/day    Types: Cigarettes  . Smokeless tobacco: Never Used  . Alcohol Use: No  . Drug Use: No  . Sexual Activity: Not on file   Other Topics Concern  . Not on file   Social History Narrative   Moved from [place], [place] 2009    Past Surgical History  Procedure Laterality Date  . Abdominal hysterectomy  1965  . Hemorroidectomy  1957  . Breast lumpectomy  1966, 1971  . Tee without cardioversion N/A 06/04/2014    Procedure:  TRANSESOPHAGEAL ECHOCARDIOGRAM (TEE);  Surgeon: Thayer Headings, MD;  Location: [facility];  Service: Cardiovascular;  Laterality: N/A;  . Cardioversion N/A 06/04/2014    Procedure: CARDIOVERSION;  Surgeon: Thayer Headings, MD;  Location: [facility] ENDOSCOPY;  Service: Cardiovascular;  Laterality: N/A;    Family History  Problem Relation Age of Onset  . Diabetes    . Hyperlipidemia Neg Hx   . Heart attack Neg Hx   . Hypertension Neg Hx   . Sudden death Neg Hx   . Colon cancer      grandson    Allergies  Allergen Reactions  . Morphine And Related Nausea And Vomiting    Current Outpatient Prescriptions on File Prior to Visit  Medication Sig Dispense Refill  . alendronate (FOSAMAX) 70 MG tablet  Take 1 tablet (70 mg total) by mouth every 7 (seven) days. Take with a full glass of water on an empty stomach. 4 tablet 11  . ALPRAZolam (XANAX) 0.5 MG tablet Take 1 tablet (0.5 mg total) by mouth daily. 30 tablet 0  . amiodarone (PACERONE) 200 MG tablet Take 1 tablet (200 mg total) by mouth daily. 30 tablet 5  . apixaban (ELIQUIS) 2.5 MG TABS tablet Take 1 tablet (2.5 mg total) by mouth 2 (two) times daily. 60 tablet 11  . atorvastatin (LIPITOR) 40 MG tablet TAKE 1 TABLET (40 MG TOTAL) BY MOUTH DAILY. 30 tablet 5  . Black Cohosh 540 MG CAPS Take 540 mg by mouth daily.    . Calcium Carbonate-Vitamin D (CALTRATE 600+D PO) Take 1 tablet by mouth 2 (two) times daily.    . Cholecalciferol (VITAMIN D3) 2000 UNITS TABS Take 1 tablet by mouth daily.      . furosemide (LASIX) 40 MG tablet TAKE 1 TABLET BY MOUTH TWICE DAILY 60 tablet 2  . glucosamine-chondroitin 500-400 MG tablet Take 1 tablet by mouth 2 (two) times daily.    . polycarbophil (FIBERCON) 625 MG tablet Take 625 mg by mouth daily.      . polyethylene glycol (MIRALAX / GLYCOLAX) packet Take 17 g by mouth daily.    . potassium chloride SA (K-DUR,KLOR-CON) 20 MEQ tablet TAKE 1 TABLET BY MOUTH DAILY. TAKE AT THE SAME TIME TAKING LASIX (FUROSEMIDE) 30 tablet 2  . traMADol (ULTRAM) 50 MG tablet Take 1 tablet (50 mg total) by mouth 3 (three) times daily as needed. 90 tablet 0  . amLODipine (NORVASC) 5 MG tablet TAKE 1 TABLET (5 MG TOTAL) BY MOUTH DAILY. (Patient not taking: Reported on 10/22/2014) 30 tablet 2   No current facility-administered medications on file prior to visit.    BP 90/68 mmHg  Pulse 75  Temp(Src) 97.5 F (36.4 C) (Oral)  Resp 18  Ht 5' 2.25" (1.581 m)  Wt 100 lb (45.36 kg)  BMI 18.15 kg/m2  SpO2 96%       Objective:   Physical Exam  Constitutional: She appears well-developed and well-nourished.  Cardiovascular: Normal rate, regular rhythm and normal heart sounds.   No murmur heard. Pulmonary/Chest: Effort normal  and breath sounds normal. No respiratory distress. She has no wheezes.  Neurological: She is alert.  Repeated same question several times during her visit.   Psychiatric: She has a normal mood and affect. Her behavior is normal. Judgment and thought content normal.          Assessment & Plan:  Consider MMSE next visit.

## 2014-10-22 NOTE — Assessment & Plan Note (Signed)
Obtain A1C.  Diabetic diet.

## 2014-10-22 NOTE — Patient Instructions (Addendum)
Stop amlodipine.  Please schedule diabetic eye exam. Complete lab work prior to leaving. Including UDS. Follow up in 3 months.

## 2014-10-22 NOTE — Telephone Encounter (Signed)
Notified pts daughter

## 2014-10-22 NOTE — Assessment & Plan Note (Signed)
Obtain flp, discussed low cholesterol diet.

## 2014-10-22 NOTE — Progress Notes (Signed)
Pre visit review using our clinic review tool, if applicable. No additional management support is needed unless otherwise documented below in the visit note. 

## 2014-10-22 NOTE — Assessment & Plan Note (Signed)
Stable.  Monitor.  

## 2014-10-22 NOTE — Telephone Encounter (Signed)
Referral was sent to [facility], referral is still in review. It can take a couple of months before you hear back from any pain management. MD must review all records before they will give appointment.

## 2014-10-22 NOTE — Assessment & Plan Note (Signed)
Discussed diabetic diet. Obtain a1c, bmet.

## 2014-10-22 NOTE — Assessment & Plan Note (Signed)
Fair control with tramadol prn, awaiting pain clinic appointment. Continue tramadol prn.

## 2014-10-22 NOTE — Assessment & Plan Note (Signed)
Discussed cessation- pt is not motivated to quit.

## 2014-10-23 ENCOUNTER — Other Ambulatory Visit: Payer: Self-pay | Admitting: Family

## 2014-10-23 NOTE — Telephone Encounter (Signed)
Received request from [facility] for amlodipine. Rx was supposed to have been stopped when pt was discharged from the hospital but it appears she continued to get it refilled. Spoke with Monica at the pharmacy and asked to D/C rx from profile. Notified daughter at appt yesterday that pt should not be taking amlodipine.

## 2014-10-25 ENCOUNTER — Telehealth: Payer: Self-pay | Admitting: *Deleted

## 2014-10-25 NOTE — Telephone Encounter (Signed)
Pt's daughter dropped off a list of pt's current medications.  Reports that even though amlodipine is on the list it has now been stopped per previous instruction. I see that metoprolol tartrate 50mg  once daily is still on pt's home medication list; not on our med list.  Notified pt's daughter that pt should not be taking metoprolol. She will check medications at home and adjust.

## 2014-11-01 ENCOUNTER — Other Ambulatory Visit: Payer: Self-pay | Admitting: Family

## 2014-11-02 ENCOUNTER — Encounter: Payer: Self-pay | Admitting: Physician Assistant

## 2014-11-02 ENCOUNTER — Ambulatory Visit (INDEPENDENT_AMBULATORY_CARE_PROVIDER_SITE_OTHER): Payer: Medicare Other | Admitting: Physician Assistant

## 2014-11-02 VITALS — BP 130/52 | HR 57 | Temp 98.5°F | Ht 62.25 in | Wt 98.4 lb

## 2014-11-02 DIAGNOSIS — H01009 Unspecified blepharitis unspecified eye, unspecified eyelid: Secondary | ICD-10-CM | POA: Insufficient documentation

## 2014-11-02 DIAGNOSIS — H01006 Unspecified blepharitis left eye, unspecified eyelid: Secondary | ICD-10-CM | POA: Diagnosis not present

## 2014-11-02 MED ORDER — POLYMYXIN B-TRIMETHOPRIM 10000-0.1 UNIT/ML-% OP SOLN: 1.0000 [drp] | OPHTHALMIC | Status: DC

## 2014-11-02 NOTE — Progress Notes (Signed)
Pre visit review using our clinic review tool, if applicable. No additional management support is needed unless otherwise documented below in the visit note. 

## 2014-11-02 NOTE — Assessment & Plan Note (Signed)
Rx polytrim op. Hygiene measures reviewed.  Follow-up if symptoms are not resolving.

## 2014-11-02 NOTE — Patient Instructions (Signed)
Please use antibiotic drops as directed. Keep eye clean an dry. Do not scratch. Do not use the same washcloth on both eyes. Follow-up if symptoms are not resolving.  Blepharitis Blepharitis is a skin problem that makes your eyelids watery, red, puffy (swollen), crusty, scaly, or painful. It may also make your eyes itch. You may lose eyelashes. HOME CARE  Keep your hands clean.  Use a clean towel each time you dry your eyelids. Do not share towels or makeup with anyone.  Carefully wash your eyelids and eyelashes 2 times a day. Use warm water and baby shampoo or just water.  Wash your face and eyebrows at least once a day.  Hold a folded washcloth under warm water. Squeeze the water out. Put the warm washcloth on your eyes 2 times a day for 10 minutes, or as told by your doctor.  Apply medicated cream as told by your doctor.  Avoid rubbing your eyes.  Avoid wearing makeup until you get better.  Follow up with your doctor as told. GET HELP RIGHT AWAY IF:  Your pain, redness, or puffiness gets worse.  Your pain, redness, or puffiness spreads to other parts of your face.  Your vision changes, or you have pain when looking at lights or moving objects.  You have a fever.  You do not get better after 2 to 4 days. MAKE SURE YOU:  Understand these instructions.  Will watch your condition.  Will get help right away if you are not doing well or get worse. Document Released: 01/28/2008 Document Revised: 07/13/2011 Document Reviewed: 05/28/2010 [facility] Patient Information 2015 [place], [place]. This information is not intended to replace advice given to you by your health care provider. Make sure you discuss any questions you have with your health care provider.

## 2014-11-02 NOTE — Progress Notes (Signed)
Patient presents to clinic today c/o swelling and drainage from left upper eyelid x 3 days. Endorses scratchy feeling of eye but denies trauma to eye or vision change.  Denies fever, chills or URI symptoms.  Past Medical History  Diagnosis Date  . Hypertension   . Hyperthyroidism   . Hyperlipidemia   . Sleep apnea   . Atrial fibrillation with RVR     a. 06/2014 admitted and failed dccv x 2, amio/eliquis started;  b. CHA2DS2VASc = 6 (eliquis).  . Chronic combined systolic and diastolic CHF (congestive heart failure)     a. 06/2014 TEE: EF 40-45%, no thrombus, mild MR.  . LBBB (left bundle branch block)   . Anemia   . Diabetes type 2, controlled   . Tobacco abuse   . Cardiomyopathy     a. 06/2014 TEE: EF 40-45%, no thrombus, mild MR.  Marland Kitchen COPD (chronic obstructive pulmonary disease)     Current Outpatient Prescriptions on File Prior to Visit  Medication Sig Dispense Refill  . alendronate (FOSAMAX) 70 MG tablet Take 1 tablet (70 mg total) by mouth every 7 (seven) days. Take with a full glass of water on an empty stomach. 4 tablet 11  . ALPRAZolam (XANAX) 0.5 MG tablet Take 1 tablet (0.5 mg total) by mouth daily. 30 tablet 0  . amiodarone (PACERONE) 200 MG tablet Take 1 tablet (200 mg total) by mouth daily. 30 tablet 5  . apixaban (ELIQUIS) 2.5 MG TABS tablet Take 1 tablet (2.5 mg total) by mouth 2 (two) times daily. 60 tablet 11  . atorvastatin (LIPITOR) 40 MG tablet TAKE 1 TABLET (40 MG TOTAL) BY MOUTH DAILY. 30 tablet 5  . Black Cohosh 540 MG CAPS Take 540 mg by mouth daily.    . Calcium Carbonate-Vitamin D (CALTRATE 600+D PO) Take 1 tablet by mouth 2 (two) times daily.    . Cholecalciferol (VITAMIN D3) 2000 UNITS TABS Take 1 tablet by mouth daily.      . furosemide (LASIX) 40 MG tablet TAKE 1 TABLET BY MOUTH TWICE DAILY 60 tablet 2  . glucosamine-chondroitin 500-400 MG tablet Take 1 tablet by mouth 2 (two) times daily.    . polycarbophil (FIBERCON) 625 MG tablet Take 625 mg by mouth  daily.      . polyethylene glycol (MIRALAX / GLYCOLAX) packet Take 17 g by mouth daily.    . potassium chloride SA (K-DUR,KLOR-CON) 20 MEQ tablet TAKE 1 TABLET BY MOUTH DAILY. TAKE AT THE SAME TIME TAKING LASIX (FUROSEMIDE) 30 tablet 2  . traMADol (ULTRAM) 50 MG tablet Take 1 tablet (50 mg total) by mouth 3 (three) times daily as needed. 90 tablet 0   No current facility-administered medications on file prior to visit.    Allergies  Allergen Reactions  . Morphine And Related Nausea And Vomiting    Family History  Problem Relation Age of Onset  . Diabetes    . Hyperlipidemia Neg Hx   . Heart attack Neg Hx   . Hypertension Neg Hx   . Sudden death Neg Hx   . Colon cancer      grandson    History   Social History  . Marital Status: Divorced    Spouse Name: N/A  . Number of Children: 2  . Years of Education: N/A   Occupational History  . retired    Social History Main Topics  . Smoking status: Current Every Day Smoker -- 1.50 packs/day    Types: Cigarettes  . Smokeless  tobacco: Never Used  . Alcohol Use: No  . Drug Use: No  . Sexual Activity: Not on file   Other Topics Concern  . None   Social History Narrative   Moved from [place], [place] 2009   Review of Systems - See HPI.  All other ROS are negative.  BP 130/52 mmHg  Pulse 57  Temp(Src) 98.5 F (36.9 C) (Oral)  Ht 5' 2.25" (1.581 m)  Wt 98 lb 6.4 oz (44.634 kg)  BMI 17.86 kg/m2  SpO2 96%  Physical Exam  Constitutional: She is oriented to person, place, and time and well-developed, well-nourished, and in no distress.  HENT:  Head: Normocephalic and atraumatic.  Eyes:  Left eyelid swollen and red without mass or abscess. Discharge noted.  Neck: Neck supple.  Cardiovascular: Normal rate, regular rhythm, normal heart sounds and intact distal pulses.   Pulmonary/Chest: Effort normal and breath sounds normal. No respiratory distress. She has no wheezes. She has no rales. She exhibits no tenderness.    Lymphadenopathy:    She has no cervical adenopathy.  Neurological: She is alert and oriented to person, place, and time.  Skin: Skin is warm and dry. No rash noted.  Psychiatric: Affect normal.  Vitals reviewed.   Recent Results (from the past 2160 hour(s))  CBC     Status: None   Collection Time: 08/08/14  8:12 AM  Result Value Ref Range   WBC 9.7 4.0 - 10.5 K/uL   RBC 4.30 3.87 - 5.11 MIL/uL   Hemoglobin 14.0 12.0 - 15.0 g/dL   HCT 41.1 36.0 - 46.0 %   MCV 95.6 78.0 - 100.0 fL   MCH 32.6 26.0 - 34.0 pg   MCHC 34.1 30.0 - 36.0 g/dL   RDW 14.1 11.5 - 15.5 %   Platelets 323 150 - 400 K/uL   MPV 10.6 8.6 - 12.4 fL  BMP with Estimated GFR ([id number])     Status: Abnormal   Collection Time: 08/08/14  8:12 AM  Result Value Ref Range   Sodium 141 135 - 145 mEq/L   Potassium 4.5 3.5 - 5.3 mEq/L   Chloride 102 96 - 112 mEq/L   CO2 29 19 - 32 mEq/L   Glucose, Bld 107 (H) 70 - 99 mg/dL   BUN 17 6 - 23 mg/dL   Creat 0.90 0.50 - 1.10 mg/dL   Calcium 9.5 8.4 - 10.5 mg/dL   GFR, Est African American 70 mL/min   GFR, Est Non African American 61 mL/min    Comment:   The estimated GFR is a calculation valid for adults (>=66 years old) that uses the CKD-EPI algorithm to adjust for age and sex. It is   not to be used for children, pregnant women, hospitalized patients,    patients on dialysis, or with rapidly changing kidney function. According to the NKDEP, eGFR >89 is normal, 60-89 shows mild impairment, 30-59 shows moderate impairment, 15-29 shows severe impairment and <15 is ESRD.     TSH     Status: None   Collection Time: 08/08/14  8:12 AM  Result Value Ref Range   TSH 1.467 0.350 - 4.500 uIU/mL  Hepatic function panel     Status: None   Collection Time: 08/08/14  8:12 AM  Result Value Ref Range   Total Bilirubin 0.5 0.2 - 1.2 mg/dL   Bilirubin, Direct 0.1 0.0 - 0.3 mg/dL   Indirect Bilirubin 0.4 0.2 - 1.2 mg/dL   Alkaline Phosphatase 81 39 - 117  U/L   AST 24 0 - 37 U/L    ALT 19 0 - 35 U/L   Total Protein 7.1 6.0 - 8.3 g/dL   Albumin 3.9 3.5 - 5.2 g/dL  Basic metabolic panel     Status: Abnormal   Collection Time: 10/22/14  8:10 AM  Result Value Ref Range   Sodium 137 135 - 145 mEq/L   Potassium 3.8 3.5 - 5.1 mEq/L   Chloride 99 96 - 112 mEq/L   CO2 31 19 - 32 mEq/L   Glucose, Bld 111 (H) 70 - 99 mg/dL   BUN 18 6 - 23 mg/dL   Creatinine, Ser 1.08 0.40 - 1.20 mg/dL   Calcium 10.1 8.4 - 10.5 mg/dL   GFR 51.82 (L) >60.00 mL/min  Hemoglobin A1c     Status: None   Collection Time: 10/22/14  8:10 AM  Result Value Ref Range   Hgb A1c MFr Bld 5.9 4.6 - 6.5 %    Comment: Glycemic Control Guidelines for People with Diabetes:Non Diabetic:  <6%Goal of Therapy: <7%Additional Action Suggested:  >8%   Lipid panel     Status: None   Collection Time: 10/22/14  8:10 AM  Result Value Ref Range   Cholesterol 155 0 - 200 mg/dL    Comment: ATP III Classification       Desirable:  < 200 mg/dL               Borderline High:  200 - 239 mg/dL          High:  > = 240 mg/dL   Triglycerides 84.0 0.0 - 149.0 mg/dL    Comment: Normal:  <150 mg/dLBorderline High:  150 - 199 mg/dL   HDL 50.70 >39.00 mg/dL   VLDL 16.8 0.0 - 40.0 mg/dL   LDL Cholesterol 88 0 - 99 mg/dL   Total CHOL/HDL Ratio 3     Comment:                Men          Women1/2 Average Risk     3.4          3.3Average Risk          5.0          4.42X Average Risk          9.6          7.13X Average Risk          15.0          11.0                       NonHDL 104.30     Comment: NOTE:  Non-HDL goal should be 30 mg/dL higher than patient's LDL goal (i.e. LDL goal of < 70 mg/dL, would have non-HDL goal of < 100 mg/dL)   Assessment/Plan: Blepharitis Rx polytrim op. Hygiene measures reviewed.  Follow-up if symptoms are not resolving.

## 2014-11-06 ENCOUNTER — Telehealth: Payer: Self-pay | Admitting: Family

## 2014-11-06 NOTE — Telephone Encounter (Signed)
We have been filling Rx for pt every 30 days (2/15, 3/15, 4/13, 5/13, 6/8).  I believe pt's medications are prepared for her by her family? Left message on Susan's (daughter) voicemail to confirm if family prepares medication or if pt manages them herself.

## 2014-11-06 NOTE — Telephone Encounter (Signed)
Please let pt know that her last 2 UDS's show she is not using xanax.  Since she is not needing xanax, then I am going to remove this from her med list.

## 2014-11-07 ENCOUNTER — Encounter: Payer: Self-pay | Admitting: Physical Medicine & Rehabilitation

## 2014-11-07 NOTE — Telephone Encounter (Signed)
Notified pt's daughter. She requests 7am or 6:15pm appts due to her own work schedule and other appts for pt that she has had to take time off work.  Scheduled pt f/u for 11/19/14 at 6:15pm and advised daughter if pt has further weight loss that she should bring her in earlier and she voices understanding.

## 2014-11-07 NOTE — Telephone Encounter (Signed)
Notified pt's daughter, Sarah [name] that PCP would not refill alprazolam until pt is seen in the office again and daughter can be present with pt to discuss anxiety / concerns. She voices understanding and states that pt has been losing weight and will not drink nutritional supplements. Reports that PA, Hassell Done had previously recommended boost / ensure pudding but she has been unable to find this and wants to know what else we would recommend. Pt weighed 120 by our scales in January and is down to 98lbs per last OV on 11/02/14 with [facility].  Please advise.

## 2014-11-07 NOTE — Telephone Encounter (Signed)
She can try glucerna.   I would like to see her back in the office to discuss her weight loss and do some further evaluation.

## 2014-11-07 NOTE — Telephone Encounter (Signed)
Spoke with pt's daughter. She states that family prepares pt's medications except the tramadol and xanax and "pt makes sure she gets both of those". Per verbal from PCP, will not prescribe xanax going forward as it appears the patient is not taking them based on the last 2 UDS results.

## 2014-11-13 ENCOUNTER — Telehealth: Payer: Self-pay | Admitting: *Deleted

## 2014-11-13 NOTE — Telephone Encounter (Signed)
Received call from pt's daughter stating pt fell last night going up the steps from their garage to the house.  [place] last week going up front steps into house. She states she is trying to get rails put in place but may be some time before they are up (getting estimates).  She is requesting Rx for cane or any device you think would be best for pt. States pt will not agree to do PT.  States pt did not sustain injuries during the fall that they are aware of and she has an appt with [place] on Monday, 11/19/14. Please advise.

## 2014-11-13 NOTE — Telephone Encounter (Signed)
Per verbal from PCP, recommends standard cane. Concerned that a 3 or 4 prong can may be more unstable for pt. Rx placed at front desk. Notified pt's daughter. Copy sent for scanning.

## 2014-11-13 NOTE — Telephone Encounter (Signed)
See rx for cane.

## 2014-11-19 ENCOUNTER — Ambulatory Visit (INDEPENDENT_AMBULATORY_CARE_PROVIDER_SITE_OTHER): Payer: Medicare Other | Admitting: Family

## 2014-11-19 ENCOUNTER — Encounter: Payer: Self-pay | Admitting: Family

## 2014-11-19 VITALS — BP 90/50 | HR 48 | Temp 98.3°F | Resp 16 | Ht 62.0 in | Wt 99.4 lb

## 2014-11-19 DIAGNOSIS — G4700 Insomnia, unspecified: Secondary | ICD-10-CM | POA: Diagnosis not present

## 2014-11-19 DIAGNOSIS — R001 Bradycardia, unspecified: Secondary | ICD-10-CM

## 2014-11-19 DIAGNOSIS — R634 Abnormal weight loss: Secondary | ICD-10-CM | POA: Diagnosis not present

## 2014-11-19 NOTE — Progress Notes (Signed)
Subjective:    Patient ID: Sarah [name], female    DOB: 10-14-1931, 81 y.o.   MRN: [id number]  HPI  Sarah [name] is an 81 yr old female who presents today for follow up. She presents today with her daughter.  She has had some falls and went to PT but declines further PT and declines to use cane.  Weight loss-  Reports poor appetite.  Has lost 22 pounds since January.   Wt Readings from Last 3 Encounters:  11/19/14 99 lb 6.4 oz (45.088 kg)  11/02/14 98 lb 6.4 oz (44.634 kg)  10/22/14 100 lb (45.36 kg)   She stopped drinking V8, does not like glucerna, or boost.    Insomnia- Previously in the past when I have asked pt if she is taking alprazolam she says I do not know because family organizes her medicines.  Family has stated in the past that she takes alprazolam every night, however pt UDS negative for xanax on 3 separate occasions.    Review of Systems    see HPI  Past Medical History  Diagnosis Date  . Hypertension   . Hyperthyroidism   . Hyperlipidemia   . Sleep apnea   . Atrial fibrillation with RVR     a. 06/2014 admitted and failed dccv x 2, amio/eliquis started;  b. CHA2DS2VASc = 6 (eliquis).  . Chronic combined systolic and diastolic CHF (congestive heart failure)     a. 06/2014 TEE: EF 40-45%, no thrombus, mild MR.  . LBBB (left bundle branch block)   . Anemia   . Diabetes type 2, controlled   . Tobacco abuse   . Cardiomyopathy     a. 06/2014 TEE: EF 40-45%, no thrombus, mild MR.  Marland Kitchen COPD (chronic obstructive pulmonary disease)     History   Social History  . Marital Status: Divorced    Spouse Name: N/A  . Number of Children: 2  . Years of Education: N/A   Occupational History  . retired    Social History Main Topics  . Smoking status: Current Every Day Smoker -- 1.50 packs/day    Types: Cigarettes  . Smokeless tobacco: Never Used  . Alcohol Use: No  . Drug Use: No  . Sexual Activity: Not on file   Other Topics Concern  . Not on file    Social History Narrative   Moved from [place], [place] 2009    Past Surgical History  Procedure Laterality Date  . Abdominal hysterectomy  1965  . Hemorroidectomy  1957  . Breast lumpectomy  1966, 1971  . Tee without cardioversion N/A 06/04/2014    Procedure: TRANSESOPHAGEAL ECHOCARDIOGRAM (TEE);  Surgeon: Thayer Headings, MD;  Location: [facility];  Service: Cardiovascular;  Laterality: N/A;  . Cardioversion N/A 06/04/2014    Procedure: CARDIOVERSION;  Surgeon: Thayer Headings, MD;  Location: [facility] ENDOSCOPY;  Service: Cardiovascular;  Laterality: N/A;    Family History  Problem Relation Age of Onset  . Diabetes    . Hyperlipidemia Neg Hx   . Heart attack Neg Hx   . Hypertension Neg Hx   . Sudden death Neg Hx   . Colon cancer      grandson    Allergies  Allergen Reactions  . Morphine And Related Nausea And Vomiting    Current Outpatient Prescriptions on File Prior to Visit  Medication Sig Dispense Refill  . alendronate (FOSAMAX) 70 MG tablet TAKE 1 TABLET BY MOUTH EVERY 7 DAYS. TAKE WITH A FULL GLASS  OF WATER ON AN EMPTY STOMACH. 4 tablet 5  . amiodarone (PACERONE) 200 MG tablet Take 1 tablet (200 mg total) by mouth daily. 30 tablet 5  . apixaban (ELIQUIS) 2.5 MG TABS tablet Take 1 tablet (2.5 mg total) by mouth 2 (two) times daily. 60 tablet 11  . atorvastatin (LIPITOR) 40 MG tablet TAKE 1 TABLET (40 MG TOTAL) BY MOUTH DAILY. 30 tablet 5  . Black Cohosh 540 MG CAPS Take 540 mg by mouth daily.    . Calcium Carbonate-Vitamin D (CALTRATE 600+D PO) Take 1 tablet by mouth 2 (two) times daily.    . Cholecalciferol (VITAMIN D3) 2000 UNITS TABS Take 1 tablet by mouth daily.      . furosemide (LASIX) 40 MG tablet TAKE 1 TABLET BY MOUTH TWICE DAILY 60 tablet 2  . glucosamine-chondroitin 500-400 MG tablet Take 1 tablet by mouth 2 (two) times daily.    . metoprolol (LOPRESSOR) 50 MG tablet Take 1 tablet by mouth daily.    . polycarbophil (FIBERCON) 625 MG tablet Take 625 mg by  mouth daily.      . polyethylene glycol (MIRALAX / GLYCOLAX) packet Take 17 g by mouth daily.    . potassium chloride SA (K-DUR,KLOR-CON) 20 MEQ tablet TAKE 1 TABLET BY MOUTH DAILY. TAKE AT THE SAME TIME TAKING LASIX (FUROSEMIDE) 30 tablet 2  . traMADol (ULTRAM) 50 MG tablet Take 1 tablet (50 mg total) by mouth 3 (three) times daily as needed. 90 tablet 0  . trimethoprim-polymyxin b (POLYTRIM) ophthalmic solution Place 1 drop into the left eye every 4 (four) hours. X 7 days 10 mL 0   No current facility-administered medications on file prior to visit.    BP 90/50 mmHg  Pulse 48  Temp(Src) 98.3 F (36.8 C) (Oral)  Resp 16  Ht 5\' 2"  (1.575 m)  Wt 99 lb 6.4 oz (45.088 kg)  BMI 18.18 kg/m2  SpO2 98%    Objective:   Physical Exam  Constitutional: She appears well-developed and well-nourished.  Frail elderly appearing female  HENT:  Hoarse voice noted  Cardiovascular: Regular rhythm and normal heart sounds.  Bradycardia present.   No murmur heard. Pulmonary/Chest: Effort normal and breath sounds normal. No respiratory distress. She has no wheezes.  Neurological: She is alert.  Skin: Skin is warm and dry.  Psychiatric: She has a normal mood and affect. Her behavior is normal. Judgment and thought content normal.          Assessment & Plan:

## 2014-11-19 NOTE — Progress Notes (Signed)
Pre visit review using our clinic review tool, if applicable. No additional management support is needed unless otherwise documented below in the visit note. 

## 2014-11-19 NOTE — Patient Instructions (Addendum)
Please schedule lab work at the front desk. We will contact you about scheduling your CT scan. Add benadryl 25mg  once daily before bedtime. Add carnation instant breakfast before each meal. Follow up in 1 month.

## 2014-11-21 ENCOUNTER — Other Ambulatory Visit: Payer: Medicare Other

## 2014-11-22 ENCOUNTER — Other Ambulatory Visit (INDEPENDENT_AMBULATORY_CARE_PROVIDER_SITE_OTHER): Payer: Medicare Other

## 2014-11-22 ENCOUNTER — Encounter: Payer: Self-pay | Admitting: Physical Medicine & Rehabilitation

## 2014-11-22 DIAGNOSIS — E785 Hyperlipidemia, unspecified: Secondary | ICD-10-CM | POA: Diagnosis not present

## 2014-11-22 LAB — LIPID PANEL
CHOL/HDL RATIO: 2
Cholesterol: 154 mg/dL (ref 0–200)
HDL: 62.6 mg/dL (ref >39.00)
LDL CALC: 78 mg/dL (ref 0–99)
NONHDL: 91.4
Triglycerides: 66 mg/dL (ref 0.0–149.0)
VLDL: 13.2 mg/dL (ref 0.0–40.0)

## 2014-11-23 ENCOUNTER — Encounter: Payer: Self-pay | Admitting: Family

## 2014-11-25 ENCOUNTER — Telehealth: Payer: Self-pay | Admitting: Family

## 2014-11-25 DIAGNOSIS — R001 Bradycardia, unspecified: Secondary | ICD-10-CM | POA: Insufficient documentation

## 2014-11-25 MED ORDER — METOPROLOL TARTRATE 50 MG PO TABS: 25.0000 mg | ORAL_TABLET | Freq: Every day | ORAL | Status: AC

## 2014-11-25 NOTE — Assessment & Plan Note (Signed)
BP is low and so is her HR. Rec decrease metoprolol from 50mg  to 25mg  once daily.  It is possible that bradycardia/low BP could be contributing to her falls. See phone note.

## 2014-11-25 NOTE — Telephone Encounter (Signed)
Please contact daughter and let her know that pt's HR and BP was low at her visit and I would like them to decrease her metoprolol 50mg  mg to 1/2 tab (25mg ) once daily.

## 2014-11-25 NOTE — Assessment & Plan Note (Signed)
D/C xanax due to inconsistent drug screens. Advise benadryl hs prn.

## 2014-11-25 NOTE — Assessment & Plan Note (Signed)
Very concerning for possibility of underlying malignancy.  Advise CT chest/abdomen to further evaluate. OK to try carnation instant breakfast. Need to obtain Cr level prior to CT.

## 2014-11-26 ENCOUNTER — Other Ambulatory Visit: Payer: Self-pay | Admitting: Cardiology

## 2014-11-26 NOTE — Telephone Encounter (Signed)
Notified pt's daughter and she voices understanding. 

## 2014-11-26 NOTE — Telephone Encounter (Signed)
Attempted to reach daughter, work # busy. Will try again later.

## 2014-11-27 NOTE — Telephone Encounter (Signed)
Rx(s) sent to pharmacy electronically.  

## 2014-11-30 ENCOUNTER — Ambulatory Visit (HOSPITAL_BASED_OUTPATIENT_CLINIC_OR_DEPARTMENT_OTHER)
Admission: RE | Admit: 2014-11-30 | Discharge: 2014-11-30 | Disposition: A | Discharge status: Home / Self Care | Payer: Medicare Other | Source: Ambulatory Visit | Attending: Family | Admitting: Family

## 2014-11-30 ENCOUNTER — Encounter (HOSPITAL_BASED_OUTPATIENT_CLINIC_OR_DEPARTMENT_OTHER): Payer: Self-pay

## 2014-11-30 DIAGNOSIS — N281 Cyst of kidney, acquired: Secondary | ICD-10-CM | POA: Diagnosis not present

## 2014-11-30 DIAGNOSIS — R634 Abnormal weight loss: Secondary | ICD-10-CM | POA: Diagnosis present

## 2014-11-30 DIAGNOSIS — K868 Other specified diseases of pancreas: Secondary | ICD-10-CM | POA: Diagnosis not present

## 2014-11-30 DIAGNOSIS — J439 Emphysema, unspecified: Secondary | ICD-10-CM | POA: Diagnosis not present

## 2014-11-30 DIAGNOSIS — M549 Dorsalgia, unspecified: Secondary | ICD-10-CM | POA: Diagnosis not present

## 2014-11-30 MED ORDER — IOHEXOL 300 MG/ML  SOLN
100.0000 mL | Freq: Once | INTRAMUSCULAR | Status: AC | PRN
  Administered 2014-11-30: 100 mL via INTRAVENOUS

## 2014-12-01 ENCOUNTER — Telehealth: Payer: Self-pay | Admitting: Family

## 2014-12-01 DIAGNOSIS — R634 Abnormal weight loss: Secondary | ICD-10-CM

## 2014-12-01 NOTE — Telephone Encounter (Signed)
Please let pt know that CT overall looks good. There is some dilation of her common bile duct.  This may not be clinically significant, but given her recent weight loss, I would recommend that she meet with GI.  I have pended referral.

## 2014-12-03 ENCOUNTER — Encounter: Payer: Self-pay | Admitting: Family

## 2014-12-03 NOTE — Telephone Encounter (Signed)
Left detailed message on Susan's (daughter) voicemail and to call if any questions.

## 2014-12-03 NOTE — Telephone Encounter (Signed)
Attempted to reach pt's daughter but line busy. Will try again.

## 2014-12-04 NOTE — Telephone Encounter (Signed)
Spoke with pt's daughter and she voices understanding for GI referral and appt has already been scheduled.  Reports that recently pt was given Carvedilol twice a day instead of once a day and pt not due for refill for 10 more days. Advised daughter to notify cardiology as they have been managing this medication and she voices understanding.

## 2014-12-05 ENCOUNTER — Other Ambulatory Visit: Payer: Self-pay

## 2014-12-05 ENCOUNTER — Encounter (HOSPITAL_BASED_OUTPATIENT_CLINIC_OR_DEPARTMENT_OTHER): Payer: Self-pay

## 2014-12-05 ENCOUNTER — Emergency Department (HOSPITAL_BASED_OUTPATIENT_CLINIC_OR_DEPARTMENT_OTHER)
Admission: EM | Admit: 2014-12-05 | Discharge: 2014-12-05 | Disposition: A | Discharge status: Home / Self Care | Payer: Medicare Other | Attending: Emergency Medicine | Admitting: Emergency Medicine

## 2014-12-05 ENCOUNTER — Telehealth: Payer: Self-pay | Admitting: Family

## 2014-12-05 ENCOUNTER — Emergency Department (HOSPITAL_BASED_OUTPATIENT_CLINIC_OR_DEPARTMENT_OTHER): Payer: Medicare Other

## 2014-12-05 DIAGNOSIS — Y9389 Activity, other specified: Secondary | ICD-10-CM | POA: Insufficient documentation

## 2014-12-05 DIAGNOSIS — W01198A Fall on same level from slipping, tripping and stumbling with subsequent striking against other object, initial encounter: Secondary | ICD-10-CM | POA: Insufficient documentation

## 2014-12-05 DIAGNOSIS — I4891 Unspecified atrial fibrillation: Secondary | ICD-10-CM | POA: Diagnosis not present

## 2014-12-05 DIAGNOSIS — E119 Type 2 diabetes mellitus without complications: Secondary | ICD-10-CM | POA: Insufficient documentation

## 2014-12-05 DIAGNOSIS — Z720 Tobacco use: Secondary | ICD-10-CM | POA: Insufficient documentation

## 2014-12-05 DIAGNOSIS — Z8669 Personal history of other diseases of the nervous system and sense organs: Secondary | ICD-10-CM | POA: Diagnosis not present

## 2014-12-05 DIAGNOSIS — S2241XA Multiple fractures of ribs, right side, initial encounter for closed fracture: Secondary | ICD-10-CM | POA: Diagnosis not present

## 2014-12-05 DIAGNOSIS — J449 Chronic obstructive pulmonary disease, unspecified: Secondary | ICD-10-CM | POA: Insufficient documentation

## 2014-12-05 DIAGNOSIS — I10 Essential (primary) hypertension: Secondary | ICD-10-CM | POA: Insufficient documentation

## 2014-12-05 DIAGNOSIS — Z79899 Other long term (current) drug therapy: Secondary | ICD-10-CM | POA: Insufficient documentation

## 2014-12-05 DIAGNOSIS — Y92094 Garage of other non-institutional residence as the place of occurrence of the external cause: Secondary | ICD-10-CM | POA: Insufficient documentation

## 2014-12-05 DIAGNOSIS — E785 Hyperlipidemia, unspecified: Secondary | ICD-10-CM | POA: Diagnosis not present

## 2014-12-05 DIAGNOSIS — S2231XA Fracture of one rib, right side, initial encounter for closed fracture: Secondary | ICD-10-CM

## 2014-12-05 DIAGNOSIS — Y998 Other external cause status: Secondary | ICD-10-CM | POA: Diagnosis not present

## 2014-12-05 DIAGNOSIS — Z862 Personal history of diseases of the blood and blood-forming organs and certain disorders involving the immune mechanism: Secondary | ICD-10-CM | POA: Diagnosis not present

## 2014-12-05 DIAGNOSIS — S299XXA Unspecified injury of thorax, initial encounter: Secondary | ICD-10-CM | POA: Diagnosis present

## 2014-12-05 DIAGNOSIS — I5042 Chronic combined systolic (congestive) and diastolic (congestive) heart failure: Secondary | ICD-10-CM | POA: Insufficient documentation

## 2014-12-05 MED ORDER — AMIODARONE HCL 200 MG PO TABS: 200.0000 mg | ORAL_TABLET | Freq: Every day | ORAL | Status: DC

## 2014-12-05 MED ORDER — TRAMADOL HCL 50 MG PO TABS
50.0000 mg | ORAL_TABLET | Freq: Once | ORAL | Status: AC
  Administered 2014-12-05: 50 mg via ORAL
  Filled 2014-12-05: qty 1

## 2014-12-05 MED ORDER — HYDROCODONE-ACETAMINOPHEN 5-325 MG PO TABS: 1.0000 | ORAL_TABLET | ORAL | Status: DC | PRN

## 2014-12-05 NOTE — Telephone Encounter (Signed)
Patient's daughter called stating that she had mistakenly given her mother a double dose of Amiodarone for the past 2 weeks and has run out of medication.  I spoke with her pharm and gave a verbal authorization for a month's supply with 0 refills to get her back on schedule.

## 2014-12-05 NOTE — ED Notes (Signed)
Family at bedside. 

## 2014-12-05 NOTE — ED Provider Notes (Signed)
CSN: [id number]     Arrival date & time 12/05/14  1054 History   First MD Initiated Contact with Patient 12/05/14 1118     Chief Complaint  Patient presents with  . Fall     (Consider location/radiation/quality/duration/timing/severity/associated sxs/prior Treatment) HPI Comments: Patient with a history of hypertension, hyperlipidemia, atrial fibrillation on ELiquis, and COPD presents with a fall. She states she was out in the garage putting some ice cream in the freezer and she turned around too fast, falling onto a futon. She states she fell only because she was moving too fast. She denies any dizziness chest pain shortness of breath or other symptoms preceding the fall. She states she did not hit her head. She's very adamant that she did not hit her head on the ground. She denies any neck or back pain. She has constant throbbing pain to her right ribs. She has some bruising on her legs but no underlying pain. She denies any other injuries from the fall. She denies any shortness of breath.  Patient is a 81 y.o. female presenting with fall.  Fall Associated symptoms include chest pain. Pertinent negatives include no abdominal pain, no headaches and no shortness of breath.    Past Medical History  Diagnosis Date  . Hypertension   . Hyperlipidemia   . Sleep apnea   . Atrial fibrillation with RVR     a. 06/2014 admitted and failed dccv x 2, amio/eliquis started;  b. CHA2DS2VASc = 6 (eliquis).  . Chronic combined systolic and diastolic CHF (congestive heart failure)     a. 06/2014 TEE: EF 40-45%, no thrombus, mild MR.  . LBBB (left bundle branch block)   . Anemia   . Diabetes type 2, controlled   . Tobacco abuse   . Cardiomyopathy     a. 06/2014 TEE: EF 40-45%, no thrombus, mild MR.  Marland Kitchen COPD (chronic obstructive pulmonary disease)    Past Surgical History  Procedure Laterality Date  . Abdominal hysterectomy  1965  . Hemorroidectomy  1957  . Breast lumpectomy  1966, 1971  . Tee without  cardioversion N/A 06/04/2014    Procedure: TRANSESOPHAGEAL ECHOCARDIOGRAM (TEE);  Surgeon: Thayer Headings, MD;  Location: [place];  Service: Cardiovascular;  Laterality: N/A;  . Cardioversion N/A 06/04/2014    Procedure: CARDIOVERSION;  Surgeon: Thayer Headings, MD;  Location: [facility] ENDOSCOPY;  Service: Cardiovascular;  Laterality: N/A;   Family History  Problem Relation Age of Onset  . Diabetes    . Hyperlipidemia Neg Hx   . Heart attack Neg Hx   . Hypertension Neg Hx   . Sudden death Neg Hx   . Colon cancer      grandson   History  Substance Use Topics  . Smoking status: Current Every Day Smoker -- 1.50 packs/day    Types: Cigarettes  . Smokeless tobacco: Never Used  . Alcohol Use: No   OB History    No data available     Review of Systems  Constitutional: Negative for fever, chills, diaphoresis and fatigue.  HENT: Negative for congestion, rhinorrhea and sneezing.   Eyes: Negative.   Respiratory: Negative for cough, chest tightness and shortness of breath.   Cardiovascular: Positive for chest pain. Negative for leg swelling.  Gastrointestinal: Negative for nausea, vomiting, abdominal pain, diarrhea and blood in stool.  Genitourinary: Negative for frequency, hematuria, flank pain and difficulty urinating.  Musculoskeletal: Negative for back pain and arthralgias.  Skin: Negative for rash.  Neurological: Negative for dizziness, speech  difficulty, weakness, numbness and headaches.      Allergies  Morphine and related  Home Medications   Prior to Admission medications   Medication Sig Start Date End Date Taking? Authorizing Provider  alendronate (FOSAMAX) 70 MG tablet TAKE 1 TABLET BY MOUTH EVERY 7 DAYS. TAKE WITH A FULL GLASS OF WATER ON AN EMPTY STOMACH. 11/06/14   Debbrah Alar, NP  amiodarone (PACERONE) 200 MG tablet Take 1 tablet (200 mg total) by mouth daily. 12/05/14   Rogelia Mire, NP  apixaban (ELIQUIS) 2.5 MG TABS tablet Take 1 tablet (2.5 mg total) by  mouth 2 (two) times daily. 06/06/14   Almyra Deforest, PA  atorvastatin (LIPITOR) 40 MG tablet TAKE 1 TABLET (40 MG TOTAL) BY MOUTH DAILY. 09/18/14   Debbrah Alar, NP  Black Cohosh 540 MG CAPS Take 540 mg by mouth daily.    Historical Provider, MD  Calcium Carbonate-Vitamin D (CALTRATE 600+D PO) Take 1 tablet by mouth 2 (two) times daily.    Historical Provider, MD  Cholecalciferol (VITAMIN D3) 2000 UNITS TABS Take 1 tablet by mouth daily.      Historical Provider, MD  furosemide (LASIX) 40 MG tablet TAKE 1 TABLET BY MOUTH TWICE DAILY 11/27/14   Lelon Perla, MD  glucosamine-chondroitin 500-400 MG tablet Take 1 tablet by mouth 2 (two) times daily.    Historical Provider, MD  HYDROcodone-acetaminophen (NORCO/VICODIN) 5-325 MG per tablet Take 1-2 tablets by mouth every 4 (four) hours as needed. 12/05/14   Malvin Johns, MD  metoprolol (LOPRESSOR) 50 MG tablet Take 0.5 tablets (25 mg total) by mouth daily. 11/25/14   Debbrah Alar, NP  polycarbophil (FIBERCON) 625 MG tablet Take 625 mg by mouth daily.      Historical Provider, MD  polyethylene glycol (MIRALAX / GLYCOLAX) packet Take 17 g by mouth daily.    Historical Provider, MD  potassium chloride SA (K-DUR,KLOR-CON) 20 MEQ tablet TAKE 1 TABLET BY MOUTH DAILY. TAKE AT THE SAME TIME AS FUROSEMIDE 11/27/14   Lelon Perla, MD  traMADol (ULTRAM) 50 MG tablet Take 1 tablet (50 mg total) by mouth 3 (three) times daily as needed. 10/22/14   Debbrah Alar, NP  trimethoprim-polymyxin b (POLYTRIM) ophthalmic solution Place 1 drop into the left eye every 4 (four) hours. X 7 days 11/02/14   Brunetta Jeans, PA-C   BP 100/46 mmHg  Pulse 59  Temp(Src) 98.5 F (36.9 C) (Oral)  Resp 16  Ht 5\' 3"  (1.6 m)  Wt 99 lb (44.906 kg)  BMI 17.54 kg/m2  SpO2 93% Physical Exam  Constitutional: She is oriented to person, place, and time. She appears well-developed and well-nourished.  HENT:  Head: Normocephalic and atraumatic.  Eyes: Pupils are equal, round,  and reactive to light.  Neck: Normal range of motion. Neck supple.  No pain along the cervical thoracic or lumbosacral spine  Cardiovascular: Normal rate, regular rhythm and normal heart sounds.   Pulmonary/Chest: Effort normal and breath sounds normal. No respiratory distress. She has no wheezes. She has no rales. She exhibits tenderness (positive small bruising to the right lateral and posterior lower ribs.There is marked tenderness to this area. No crepitus.).  Abdominal: Soft. Bowel sounds are normal. There is no tenderness. There is no rebound and no guarding.  Musculoskeletal: Normal range of motion. She exhibits no edema.  There is small amount of bruising to the anterior aspects of both knees without underlying tenderness. There is no pain on palpation or range of motion of the  extremities including the hips.  Lymphadenopathy:    She has no cervical adenopathy.  Neurological: She is alert and oriented to person, place, and time.  Skin: Skin is warm and dry. No rash noted.  Psychiatric: She has a normal mood and affect.    ED Course  Procedures (including critical care time) Labs Review Labs Reviewed - No data to display  Imaging Review Dg Ribs Unilateral W/chest Right  12/05/2014   CLINICAL DATA:  Fall.  Right-sided rib pain.  EXAM: RIGHT RIBS AND CHEST - 3+ VIEW  COMPARISON:  06/03/2014.  12/07/2011 .  FINDINGS: Slightly displaced right posterior lateral eighth, ninth, and tenth rib fractures are present. No pneumothorax. Chronic interstitial lung disease noted. Stable tiny nodular opacity right pulmonary apex. Small right pleural effusion. Heart size stable.  IMPRESSION: 1. Slight displaced right posterior lateral eighth, ninth, and tenth rib fractures. No pneumothorax. Small right pleural effusion.  2.  Chronic interstitial lung disease.   Electronically Signed   By: Marcello Moores  Register   On: 12/05/2014 11:47     EKG Interpretation None      MDM   Final diagnoses:  Rib  fractures, right, closed, initial encounter    Patient presents after mechanical fall. She has 3 slightly displaced right rib fractures. There is no evidence of pneumothorax. She has underlying history of COPD. I strongly recommended that patient be admitted for observation. Patient is adamantly refusing this. I talk with the patient's daughter and her at length about the risk of developing a pulmonary contusion and respiratory failure that she is refusing admission. Her daughter states that she will keep a close eye on her at home and bring her back if her symptoms worsen. She is very adamant that she did not hit her head on the floor. Given this I did not do a CT scan of her head even though she is on Eliquis. She does not have any other apparent injuries. She was discharged with an incentive spirometer. She was given a prescription for Vicodin to use for pain.    Malvin Johns, MD 12/05/14 [phone]

## 2014-12-05 NOTE — ED Notes (Addendum)
Unwitnessed fall PTA.  States she was ambulating in the garage, slipped and fell onto concrete.  States she hit the right side of her body on a chair when she fell.  Abrasions on right foot and right knee, TTP on right lower rib area.  Pain increases with movement.  Per family, after fall pt was able to able ambulate, however was unsteady.  Denies striking head, LOC or neck pain.

## 2014-12-05 NOTE — ED Notes (Signed)
MD at bedside. 

## 2014-12-05 NOTE — Telephone Encounter (Signed)
Left msg to call back and schedule ER f/u

## 2014-12-05 NOTE — Discharge Instructions (Signed)

## 2014-12-05 NOTE — ED Notes (Signed)
Returned from radiology.  Family at bedside.

## 2014-12-05 NOTE — Telephone Encounter (Signed)
Please contact pt to arrange ER follow up within 1 week.

## 2014-12-06 ENCOUNTER — Telehealth: Payer: Self-pay | Admitting: Family

## 2014-12-06 NOTE — Telephone Encounter (Signed)
Daughter called and says that mother is in a lot of pain and therefore has been taking hydrocodone, but has been taking med without food.  This has upset her stomach and patient has been throwing up.  Per daughter, the patient has not been experiencing any other symptoms (chest pain, shortness of breath, weakness, signs of bleeding) and patient refuses to go to the ER.  Daughter wants to know if there is anything else patient can do to prevent upset stomach other than eat before or with taking pain medication.   Please advise.

## 2014-12-06 NOTE — Telephone Encounter (Signed)
Please call zofran 4 mg po q 8 prn, #20

## 2014-12-06 NOTE — Telephone Encounter (Signed)
Caller name:Susan Relation to pt: Daughter Call back number: [phone] Pharmacy:  Reason for call: Pt's daughter came in office since she works across the hall and wanted to speak with someone about her mothers symptoms from today 12-06-14 after her broken ribs from yesterday 12-05-14. Pt's daughter wants to be called at her work number above since she has a few question. Please advise.

## 2014-12-06 NOTE — Telephone Encounter (Signed)
Med phoned in to [place].  Daughter called and made aware.  No further concerns voiced at this time.

## 2014-12-07 ENCOUNTER — Telehealth: Payer: Self-pay | Admitting: Family

## 2014-12-07 MED ORDER — HYDROCODONE-ACETAMINOPHEN 5-325 MG PO TABS: 1.0000 | ORAL_TABLET | ORAL | Status: DC | PRN

## 2014-12-07 NOTE — Telephone Encounter (Signed)
Will give one time refill of hydrocodone. See rx.

## 2014-12-07 NOTE — Telephone Encounter (Signed)
Spoke with pt's daughter. She states pt is needing 2 tablets every 4 to 6 hours and ER only gave pt #20. [name] states pt will be out of medication before the weekend is over (only has 10 left). Tramadol is not helping and pt in pain from her rib fracture. Wants greater quantity of hydrocodone.  Also notes that pt seems to "have a cold and is unable to expectorate because coughing hurts".  Please advise?

## 2014-12-07 NOTE — Telephone Encounter (Signed)
Caller name: [name] Relation to pt: Daughter Call back number: [phone] Work [employer]:  Reason for call: Pt's daughter came in states needs to speak with Melissa about her mother's meds HYDROcodone-acetaminophen (NORCO/VICODIN) 5-325 MG per tablet  since mother is still in pain (from broken ribs), and mention she is going to need more meds for the weekend. [name] wants to be called at work number from above. Please advise.

## 2014-12-07 NOTE — Telephone Encounter (Signed)
Rx placed at front desk and pt's daughter has been notified.

## 2014-12-11 ENCOUNTER — Encounter: Payer: Self-pay | Admitting: *Deleted

## 2014-12-12 ENCOUNTER — Encounter: Payer: Self-pay | Admitting: Family

## 2014-12-12 ENCOUNTER — Ambulatory Visit (INDEPENDENT_AMBULATORY_CARE_PROVIDER_SITE_OTHER): Payer: Medicare Other | Admitting: Family

## 2014-12-12 ENCOUNTER — Telehealth: Payer: Self-pay | Admitting: *Deleted

## 2014-12-12 ENCOUNTER — Ambulatory Visit (HOSPITAL_BASED_OUTPATIENT_CLINIC_OR_DEPARTMENT_OTHER)
Admission: RE | Admit: 2014-12-12 | Discharge: 2014-12-12 | Disposition: A | Discharge status: Home / Self Care | Payer: Medicare Other | Source: Ambulatory Visit | Attending: Family | Admitting: Family

## 2014-12-12 VITALS — BP 90/70 | HR 67 | Temp 97.8°F | Resp 16 | Ht 62.0 in | Wt 95.0 lb

## 2014-12-12 DIAGNOSIS — S2241XA Multiple fractures of ribs, right side, initial encounter for closed fracture: Secondary | ICD-10-CM | POA: Diagnosis not present

## 2014-12-12 DIAGNOSIS — J939 Pneumothorax, unspecified: Secondary | ICD-10-CM | POA: Diagnosis not present

## 2014-12-12 DIAGNOSIS — S2231XA Fracture of one rib, right side, initial encounter for closed fracture: Secondary | ICD-10-CM | POA: Diagnosis not present

## 2014-12-12 DIAGNOSIS — W19XXXA Unspecified fall, initial encounter: Secondary | ICD-10-CM | POA: Diagnosis not present

## 2014-12-12 DIAGNOSIS — R0781 Pleurodynia: Secondary | ICD-10-CM | POA: Diagnosis present

## 2014-12-12 DIAGNOSIS — R634 Abnormal weight loss: Secondary | ICD-10-CM

## 2014-12-12 DIAGNOSIS — J90 Pleural effusion, not elsewhere classified: Secondary | ICD-10-CM | POA: Diagnosis not present

## 2014-12-12 MED ORDER — HYDROCODONE-ACETAMINOPHEN 5-325 MG PO TABS: 1.0000 | ORAL_TABLET | Freq: Four times a day (QID) | ORAL | Status: DC | PRN

## 2014-12-12 MED ORDER — ESCITALOPRAM OXALATE 10 MG PO TABS
ORAL_TABLET | ORAL | Status: DC
Start: 2014-12-12 — End: 2015-01-16

## 2014-12-12 NOTE — Patient Instructions (Addendum)
Stop tramadol. Continue vicodin one tab every 6 hours as needed for pain. You can take one tablet of tylenol 325mg  with the one tablet of vicodin. When vicoden runs out- change to tylenol 650mg  every 6 hours as needed. Keep upcoming appointment with GI and Pain management.   Start lexapro 10mg - 1/2 tab by mouth once daily for 1 week, then increase to a full tab on week two. Schedule apt with Dr.  Casimiro Needle.

## 2014-12-12 NOTE — Telephone Encounter (Signed)
Received call from Radiologist at [facility] (remote reading) that xray shows minimal to moderate left apical pneumothorax, contralateral to rib fracture.  Please advise?

## 2014-12-12 NOTE — Progress Notes (Signed)
Subjective:    Patient ID: Sarah [name], female    DOB: August 06, 1931, 81 y.o.   MRN: [id number]  HPI  Sarah [name] is an 81 yr old female who presents today for ED follow up. ED records are reviewed.  The patient presented to the ED on 12/05/14 following an accidental fall in her garage. Apparently, she lost her balance and fell onto a futon.  She developed throbbing pain in her right ribcage. X-ray revealed Slight displaced right posterior lateral eighth, ninth, and tenth rib fractures. No pneumothorax. Small right pleural effusion. It was recommended that the patient be admitted for observation but pt refused.  She was discharged home with an incentive spirometer and an rx for PRN vicoden.   Pt reports pain is on/off.  She continues to have pain.   Weight loss-  Has apt tomorrow with gastroenterology. Daughter reports that pt has not been eating well. Reports that it is "as if she has given up." Daughter will contact Dr. Karen Chafe office today to try to arrange psychiatry appointment.   Wt Readings from Last 3 Encounters:  12/12/14 95 lb (43.092 kg)  12/05/14 99 lb (44.906 kg)  11/19/14 99 lb 6.4 oz (45.088 kg)     Review of Systems See HPI  Past Medical History  Diagnosis Date  . Hypertension   . Hyperlipidemia   . Sleep apnea   . Atrial fibrillation with RVR     a. 06/2014 admitted and failed dccv x 2, amio/eliquis started;  b. CHA2DS2VASc = 6 (eliquis).  . Chronic combined systolic and diastolic CHF (congestive heart failure)     a. 06/2014 TEE: EF 40-45%, no thrombus, mild MR.  . LBBB (left bundle branch block)   . Anemia   . Diabetes type 2, controlled   . Tobacco abuse   . Cardiomyopathy     a. 06/2014 TEE: EF 40-45%, no thrombus, mild MR.  Marland Kitchen COPD (chronic obstructive pulmonary disease)   . Elevated LFTs   . Pancreas divisum   . Aortic atherosclerosis   . Internal hemorrhoids   . Hyperplastic colon polyp     Social History   Social History  . Marital Status:  Divorced    Spouse Name: N/A  . Number of Children: 2  . Years of Education: N/A   Occupational History  . retired    Social History Main Topics  . Smoking status: Current Every Day Smoker -- 1.50 packs/day    Types: Cigarettes  . Smokeless tobacco: Never Used  . Alcohol Use: No  . Drug Use: No  . Sexual Activity: Not on file   Other Topics Concern  . Not on file   Social History Narrative   Moved from [place], [place] 2009    Past Surgical History  Procedure Laterality Date  . Abdominal hysterectomy  1965  . Hemorroidectomy  1957  . Breast lumpectomy  1966, 1971  . Tee without cardioversion N/A 06/04/2014    Procedure: TRANSESOPHAGEAL ECHOCARDIOGRAM (TEE);  Surgeon: Thayer Headings, MD;  Location: [place];  Service: Cardiovascular;  Laterality: N/A;  . Cardioversion N/A 06/04/2014    Procedure: CARDIOVERSION;  Surgeon: Thayer Headings, MD;  Location: [facility] ENDOSCOPY;  Service: Cardiovascular;  Laterality: N/A;    Family History  Problem Relation Age of Onset  . Diabetes    . Hyperlipidemia Neg Hx   . Heart attack Neg Hx   . Hypertension Neg Hx   . Sudden death Neg Hx   . Colon  cancer      grandson    Allergies  Allergen Reactions  . Morphine And Related Nausea And Vomiting    Current Outpatient Prescriptions on File Prior to Visit  Medication Sig Dispense Refill  . alendronate (FOSAMAX) 70 MG tablet TAKE 1 TABLET BY MOUTH EVERY 7 DAYS. TAKE WITH A FULL GLASS OF WATER ON AN EMPTY STOMACH. 4 tablet 5  . amiodarone (PACERONE) 200 MG tablet Take 1 tablet (200 mg total) by mouth daily. 30 tablet 0  . apixaban (ELIQUIS) 2.5 MG TABS tablet Take 1 tablet (2.5 mg total) by mouth 2 (two) times daily. 60 tablet 11  . atorvastatin (LIPITOR) 40 MG tablet TAKE 1 TABLET (40 MG TOTAL) BY MOUTH DAILY. 30 tablet 5  . Black Cohosh 540 MG CAPS Take 540 mg by mouth daily.    . Calcium Carbonate-Vitamin D (CALTRATE 600+D PO) Take 1 tablet by mouth 2 (two) times daily.    .  Cholecalciferol (VITAMIN D3) 2000 UNITS TABS Take 1 tablet by mouth daily.      . furosemide (LASIX) 40 MG tablet TAKE 1 TABLET BY MOUTH TWICE DAILY 60 tablet 6  . glucosamine-chondroitin 500-400 MG tablet Take 1 tablet by mouth 2 (two) times daily.    . metoprolol (LOPRESSOR) 50 MG tablet Take 0.5 tablets (25 mg total) by mouth daily.    . polycarbophil (FIBERCON) 625 MG tablet Take 625 mg by mouth daily.      . polyethylene glycol (MIRALAX / GLYCOLAX) packet Take 17 g by mouth daily.    . potassium chloride SA (K-DUR,KLOR-CON) 20 MEQ tablet TAKE 1 TABLET BY MOUTH DAILY. TAKE AT THE SAME TIME AS FUROSEMIDE 30 tablet 6  . trimethoprim-polymyxin b (POLYTRIM) ophthalmic solution Place 1 drop into the left eye every 4 (four) hours. X 7 days 10 mL 0   No current facility-administered medications on file prior to visit.    BP 90/70 mmHg  Pulse 67  Temp(Src) 97.8 F (36.6 C) (Oral)  Resp 16  Ht 5\' 2"  (1.575 m)  Wt 95 lb (43.092 kg)  BMI 17.37 kg/m2  SpO2 96%       Objective:   Physical Exam  Constitutional: She appears well-developed and well-nourished.  HENT:  Head: Normocephalic and atraumatic.  Cardiovascular: Normal rate, regular rhythm and normal heart sounds.   No murmur heard. Pulmonary/Chest: Effort normal. No respiratory distress. She has wheezes.  Neurological: She is alert.  Psychiatric: She has a normal mood and affect. Her behavior is normal. Judgment and thought content normal.          Assessment & Plan:  Rib Fracture- obtain x ray to assess given wheezing today.

## 2014-12-12 NOTE — Telephone Encounter (Signed)
Please let daughter know that I reviewed CXR. No pneumonia.  Note is made of mild collapse of lung at the top of the left side.  I would recommend that she see pulmonology for consult.  If SOB occurs or worsening chest pain she should proceed to the ED.

## 2014-12-12 NOTE — Telephone Encounter (Signed)
Attempted to reach daughter at work # 3 times this afternoon with no response. Left detailed message on daughter's voicemail and to call if any questions.

## 2014-12-12 NOTE — Assessment & Plan Note (Addendum)
Uncontrolled. CT chest/abd pelvis without obvious malignancy. TSH normal.  ? If depression is contributing. Will give trial of lexapro while we wait to try to get her in with psychiatry. Follow up in 1 month. Pt to keep apt with GI as well.

## 2014-12-12 NOTE — Progress Notes (Signed)
Pre visit review using our clinic review tool, if applicable. No additional management support is needed unless otherwise documented below in the visit note. 

## 2014-12-13 ENCOUNTER — Encounter: Payer: Self-pay | Admitting: Gastroenterology

## 2014-12-13 ENCOUNTER — Ambulatory Visit (INDEPENDENT_AMBULATORY_CARE_PROVIDER_SITE_OTHER): Payer: Medicare Other | Admitting: Gastroenterology

## 2014-12-13 ENCOUNTER — Telehealth: Payer: Self-pay | Admitting: *Deleted

## 2014-12-13 VITALS — BP 100/60 | HR 64 | Ht 62.0 in | Wt 95.4 lb

## 2014-12-13 DIAGNOSIS — Z7901 Long term (current) use of anticoagulants: Secondary | ICD-10-CM | POA: Diagnosis not present

## 2014-12-13 DIAGNOSIS — R932 Abnormal findings on diagnostic imaging of liver and biliary tract: Secondary | ICD-10-CM | POA: Insufficient documentation

## 2014-12-13 DIAGNOSIS — R634 Abnormal weight loss: Secondary | ICD-10-CM | POA: Diagnosis not present

## 2014-12-13 DIAGNOSIS — K625 Hemorrhage of anus and rectum: Secondary | ICD-10-CM | POA: Diagnosis not present

## 2014-12-13 NOTE — Telephone Encounter (Signed)
Dc apixaban 2 days prior to procedure and resume day after Sarah [name]

## 2014-12-13 NOTE — Telephone Encounter (Signed)
I have spoken to patient's daughter (caregiver), [name] to advise that per Dr Stanford Breed, she may hold Sarah [name] 2 days prior to procedure.  She verbalizes understanding.

## 2014-12-13 NOTE — Patient Instructions (Signed)
You have been scheduled for an endoscopy and colonoscopy. Please follow the written instructions given to you at your visit today. Please pick up your prep supplies at the pharmacy within the next 1-3 days. If you use inhalers (even only as needed), please bring them with you on the day of your procedure. Your physician has requested that you go to www.startemmi.com and enter the access code given to you at your visit today. This web site gives a general overview about your procedure. However, you should still follow specific instructions given to you by our office regarding your preparation for the procedure.  You will be contacted by our office prior to your procedure for directions on holding your Eliquis.  If you do not hear from our office 1 week prior to your scheduled procedure, please call [phone] to discuss.   

## 2014-12-13 NOTE — Progress Notes (Addendum)
12/13/2014 Sarah [name] [id number] 08-14-1931   History of Present Illness:  This is an 81 year old female who is previously known to Dr. Hilarie Fredrickson for elevated alk phos. I had actually seen her back in February of this year for some minor rectal bleeding as well. At that visit the patient was very anxious and difficult, just kept asking to leave, saying she was fine.  Her last colonoscopy was in 02/2008 at which time she was found to have three 4 mm hyperplastic rectal polyps and internal hemorrhoids; that was performed by Dr. Dorrene German in [facility].  We decided to just monitor at that time since patient was declining to have a procedure.    She is here today with her daughter due to weight loss. According to our scale she has lost approximately 20 pounds since her last visit in February. Her daughter knows that her mother is difficult and is actually having her evaluated soon by a psychiatrist for competency with making medical decisions, etc. Says she has been having childlike behaviors. The patient admits that her appetite is poor and she is not eating much; says that she knows she needs to eat more. She lives with her daughter who cooks all the time, but patient says she simply has no appetite. She denies any complaints. Once again, she keeps saying that she is fine.  Denies any further rectal bleeding.  She had been seen by Dr. Hilarie Fredrickson for the elevated alk phos.  Imaging seemed to confirm that the changes are chronic and with stable findings. He recommended a repeat MRI abdomen/MRCP in one year from the last, with was actually due earlier this year. Now she has a repeat CT scan of the chest, abdomen, and pelvis that once again shows chronic changes of them mild biliary and pancreatic ductal dilatation without definitive mass lesions; changes are stable from the prior MRCP.   As of her last labs from April this year her alkaline phosphatase has normalized to 81.  Past medical history includes  hypertension, hyperlipidemia, atrial fibrillation on Eliquis, chronic combined systolic and diastolic heart failure with an EF of 40-45%, type 2 diabetes mellitus, COPD/emphysema, tobacco abuse.   Current Medications, Allergies, Past Medical History, Past Surgical History, Family History and Social History were reviewed in [employer] record.   Physical Exam: BP 100/60 mmHg  Pulse 64  Ht _0  (1.575 m)  Wt 95 lb 6.4 oz (43.273 kg)  BMI 17.44 kg/m2 General:  Very thin elderly white female in no acute distress Head: Normocephalic and atraumatic Eyes:  Sclerae anicteric, conjunctiva pink  Ears: Normal auditory acuity Lungs: Clear throughout to auscultation Heart: Regular rate and rhythm Abdomen: Soft, non-distended.  Normal bowel sounds.  Non-tender. Rectal:  Will be done at the time of colonoscopy. Musculoskeletal: Symmetrical with no gross deformities  Extremities: No edema  Neurological: Alert oriented x 4, grossly non-focal Psychological:  Alert and cooperative. Normal mood and affect  Assessment and Recommendations: -Weight loss:  About 20 pounds since February.  Patient admits to poor appetite and not eating much, which obviously can account for weight loss. CT of the chest, abdomen, and pelvis did not show any sign of malignancies. She had been seen earlier this year for rectal bleeding and colonoscopy is recommended, however, she declined at that time. She is agreeable to proceed with colonoscopy and EGD for now, but is very anxious and difficult so unsure if she will actually follow through with these procedures. -Atrial  fibrillation, on Eliquis:  Will hold Eliquis for 2 days prior to endoscopic procedures - will instruct when and how to resume after procedure. Benefits and risks of procedure explained including risks of bleeding, perforation, infection, missed lesions, reactions to medications and possible need for hospitalization and surgery for  complications. Additional rare but real risk of stroke or other vascular clotting events off of Eliquis also explained and need to seek urgent help if any signs of these problems occur. Will communicate by phone or EMR with patient's prescribing provider, Dr. Stanford Breed, to confirm that holding Eliquis is reasonable in this case.  -Elevated alk phos/biliary ductal diliation without obstruction or mass on imaging:  It was recommended that she have a repeat MRI/MRCP earlier this year, which is one year from the last. She now has a new CT scan that shows chronic changes that are stable as compared to the previous MRI/ MRCP. Do not think we need to repeat any other imaging at this time. Alkaline phosphatase is now normalized as of last labs.  Addendum: Reviewed and agree with initial management. Jerene Bears, MD

## 2014-12-13 NOTE — Telephone Encounter (Signed)
Left message for patient to call back  

## 2014-12-13 NOTE — Telephone Encounter (Signed)
12/13/2014  RE: Sarah [name] DOB: 02-02-1932 MRN: [id number]  Dear Dr Stanford Breed,   We have scheduled the above patient for a endoscopy and colonoscopy procedure. Our records show that she is on anticoagulation therapy.  Please advise as to whether the patient may come off her therapy of Eliquis 2 days prior to the procedure, which is scheduled for 02/27/15. Please route your response to Dixon Boos, CMA  Sincerely,  Dixon Boos

## 2014-12-18 ENCOUNTER — Encounter: Payer: Self-pay | Admitting: Physical Medicine & Rehabilitation

## 2014-12-18 ENCOUNTER — Ambulatory Visit (HOSPITAL_BASED_OUTPATIENT_CLINIC_OR_DEPARTMENT_OTHER): Payer: Medicare Other | Admitting: Physical Medicine & Rehabilitation

## 2014-12-18 ENCOUNTER — Encounter: Payer: Medicare Other | Attending: Physical Medicine & Rehabilitation

## 2014-12-18 ENCOUNTER — Other Ambulatory Visit: Payer: Self-pay | Admitting: Physical Medicine & Rehabilitation

## 2014-12-18 ENCOUNTER — Encounter: Payer: Self-pay | Admitting: Family

## 2014-12-18 VITALS — BP 122/61 | HR 62

## 2014-12-18 DIAGNOSIS — M47816 Spondylosis without myelopathy or radiculopathy, lumbar region: Secondary | ICD-10-CM | POA: Insufficient documentation

## 2014-12-18 DIAGNOSIS — Z79899 Other long term (current) drug therapy: Secondary | ICD-10-CM | POA: Diagnosis not present

## 2014-12-18 DIAGNOSIS — E785 Hyperlipidemia, unspecified: Secondary | ICD-10-CM | POA: Insufficient documentation

## 2014-12-18 DIAGNOSIS — M791 Myalgia: Secondary | ICD-10-CM

## 2014-12-18 DIAGNOSIS — Z5181 Encounter for therapeutic drug level monitoring: Secondary | ICD-10-CM | POA: Diagnosis not present

## 2014-12-18 DIAGNOSIS — M4806 Spinal stenosis, lumbar region: Secondary | ICD-10-CM | POA: Insufficient documentation

## 2014-12-18 DIAGNOSIS — I10 Essential (primary) hypertension: Secondary | ICD-10-CM | POA: Insufficient documentation

## 2014-12-18 DIAGNOSIS — G894 Chronic pain syndrome: Secondary | ICD-10-CM

## 2014-12-18 DIAGNOSIS — M7918 Myalgia, other site: Secondary | ICD-10-CM

## 2014-12-18 DIAGNOSIS — S2241XA Multiple fractures of ribs, right side, initial encounter for closed fracture: Secondary | ICD-10-CM

## 2014-12-18 DIAGNOSIS — M545 Low back pain: Secondary | ICD-10-CM | POA: Diagnosis not present

## 2014-12-18 DIAGNOSIS — G8929 Other chronic pain: Secondary | ICD-10-CM | POA: Insufficient documentation

## 2014-12-18 MED ORDER — HYDROCODONE-ACETAMINOPHEN 5-325 MG PO TABS
1.0000 | ORAL_TABLET | Freq: Four times a day (QID) | ORAL | Status: DC | PRN
Start: 2014-12-18 — End: 2015-01-29

## 2014-12-18 MED ORDER — LIDOCAINE 5 % EX PTCH: 2.0000 | MEDICATED_PATCH | CUTANEOUS | Status: DC

## 2014-12-18 NOTE — Patient Instructions (Signed)
If insurance does not cover the 5% patches, there are 4% patches available over-the-counter possibly at [employer]. These should only be worn for 12 hours per day. Wearing them 24 hours a day could lead to toxicity related to excessive lidocaine  Patient may have her pain medication extended another couple weeks possibly going up to every 4 hours as needed.  May try a heating pad when the patch is not on, 30 minutes every 2 hours as needed

## 2014-12-18 NOTE — Progress Notes (Signed)
Subjective:    Patient ID: Sarah [name], female    DOB: [birth year], 81 y.o.   MRN: [id number]  HPI Chief complaint is left-sided low back pain  81 year old female with past medical history of Atrial fibrillation with rapid ventricular response, Cardiomyopathy, COPD,Continued tobacco abuse who gives a six-month or longer history of primarily left-sided low back pain. I reviewed the primary care notes She did present primary care on 05/15/2014 with severe low back pain. An MRI of the lumbar spine was ordered with report below. I viewed the actual images as well  The patient had requested pain medications from her primary care physician. One of the primary care physicians had documented a Past history of addiction to pain medication.  The patient has had history of multiple falls. The most recent one was on 12/05/2014 when she fell in the garage while getting something out of the freezer. Imaging studies revealed lateral rib fractures right side at 8/9 and 10th ribs, she's had some pain in that area but nothing as bad as on the right side.  CT of the abdomen and pelvis in July 2016 demonstrated renal cysts but no other worrisome masses in the abdomen or pelvic area.   The patient is on chronic anticoagulation for her atrial fibrillation. Pain Inventory Average Pain 9 Pain Right Now 8 My pain is constant and sharp  In the last 24 hours, has pain interfered with the following? General activity 2 Relation with others 0 Enjoyment of life 0 What TIME of day is your pain at its worst? night Sleep (in general) NA  Pain is worse with: sitting Pain improves with: heat/ice and medication Relief from Meds: 1  Mobility how many minutes can you walk? 5 ability to climb steps?  yes do you drive?  no  Function not employed: date last employed june 2009 retired  Neuro/Psych weakness  Prior Studies Any changes since last visit?  yes CT/MRI CLINICAL DATA: Low back pain with LEFT  gluteal pain. No leg problems. No numbness or weakness. Lumbago. LEFT-sided sciatica.  EXAM: MRI LUMBAR SPINE WITHOUT CONTRAST  TECHNIQUE: Multiplanar, multisequence MR imaging of the lumbar spine was performed. No intravenous contrast was administered.  COMPARISON: 05/10/2014. MRI 06/10/2013.  FINDINGS: The numbering convention used for this exam termed L5-S1 as the last intervertebral disc space. 5 lumbar type vertebral bodies on prior radiographs.  Alignment: Dextroconvex curve of the lumbar spine with the apex at L4. Grade I retrolisthesis at every level of the lumbar spine except for L4-L5 due to collapse of the disc space. Retrolisthesis measures 4 mm and under.  Vertebrae: Severe degenerative endplate changes at U3-J4 and L2-L3. Every level except L4-L5 shows degenerative endplate changes. No compression fracture. Discogenic marrow edema is present at L1-L2, L2-L3 and L3-L4.  Conus medullaris: Normal at L1.  Paraspinal tissues: Distended gallbladder. Unchanged renal cysts compared to prior MRI. Probable bilateral pleural effusions.  Disc levels:  Lower thoracic levels show disc desiccation. T10-T11 shows mild central stenosis. There is a central disc extrusion with caudal migration of disc material indenting the distal thoracic cord. Foramina appear patent. T11-T12 shows disc desiccation but no stenosis.  T12-L1: Negative.  L1-L2: RIGHT eccentric broad-based posterior disc bulging and endplate osteophytes. RIGHT lateral protrusion is present. Mild facet arthrosis and ligamentum flavum redundancy.  L2-L3: Mild central stenosis associated with broad-based disc bulging and endplate osteophytes. Posterior ligamentum flavum redundancy is also present. Mild bilateral foraminal stenosis secondary to degenerative disc disease.  L3-L4: Moderate multifactorial  central stenosis. LEFT eccentric broad-based posterior disc bulging. LEFT foraminal disc  extrusion with cranial migration. Moderate to severe LEFT foraminal stenosis potentially affecting the LEFT L3 nerve. LEFT subarticular stenosis. The RIGHT neural foramen appears patent. Bilateral facet arthrosis.  L4-L5: Moderate multifactorial central stenosis. Shallow broad-based disc bulging. Moderate LEFT foraminal stenosis due to disc bulging, scoliosis and facet arthrosis. Bilateral subarticular stenosis is present.  L5-S1: Severe disc degeneration with collapse of the disc space. There is moderate bilateral foraminal stenosis due to endplate spurring, loss of disc height and facet arthrosis. There is a focal disc extrusion in the RIGHT subarticular zone with caudal extension of disc material. This compresses the descending RIGHT S1 nerve. Mild encroachment on the descending LEFT S1 nerve but no compression. Central canal is patent.  IMPRESSION: Severe lumbar spondylosis detailed above. In this patient with LEFT-sided radicular symptoms, the foraminal stenosis at L3-L4 and L4-L5 along with subarticular stenosis at L3-L4 is potentially implicated. There is also severe L5-S1 disease eccentric to the RIGHT however the significance is unclear in this patient with LEFT-sided predominant symptoms by report.  Physicians involved in your care Any changes since last visit?  yes Primary care [facility] Neurologist Kary Kos   Family History  Problem Relation Age of Onset  . Diabetes    . Hyperlipidemia Neg Hx   . Heart attack Neg Hx   . Hypertension Neg Hx   . Sudden death Neg Hx   . Colon cancer      grandson   Social History   Social History  . Marital Status: Divorced    Spouse Name: N/A  . Number of Children: 2  . Years of Education: N/A   Occupational History  . retired    Social History Main Topics  . Smoking status: Current Every Day Smoker -- 1.50 packs/day    Types: Cigarettes  . Smokeless tobacco: Never Used  . Alcohol Use: No  . Drug Use: No  .  Sexual Activity: Not Asked   Other Topics Concern  . None   Social History Narrative   Moved from [place], [place] 2009   Past Surgical History  Procedure Laterality Date  . Abdominal hysterectomy  1965  . Hemorroidectomy  1957  . Breast lumpectomy  1966, 1971  . Tee without cardioversion N/A 06/04/2014    Procedure: TRANSESOPHAGEAL ECHOCARDIOGRAM (TEE);  Surgeon: Thayer Headings, MD;  Location: [facility];  Service: Cardiovascular;  Laterality: N/A;  . Cardioversion N/A 06/04/2014    Procedure: CARDIOVERSION;  Surgeon: Thayer Headings, MD;  Location: [facility] ENDOSCOPY;  Service: Cardiovascular;  Laterality: N/A;   Past Medical History  Diagnosis Date  . Hypertension   . Hyperlipidemia   . Sleep apnea   . Atrial fibrillation with RVR     a. 06/2014 admitted and failed dccv x 2, amio/eliquis started;  b. CHA2DS2VASc = 6 (eliquis).  . Chronic combined systolic and diastolic CHF (congestive heart failure)     a. 06/2014 TEE: EF 40-45%, no thrombus, mild MR.  . LBBB (left bundle branch block)   . Anemia   . Diabetes type 2, controlled   . Tobacco abuse   . Cardiomyopathy     a. 06/2014 TEE: EF 40-45%, no thrombus, mild MR.  Marland Kitchen COPD (chronic obstructive pulmonary disease)   . Elevated LFTs   . Pancreas divisum   . Aortic atherosclerosis   . Internal hemorrhoids   . Hyperplastic colon polyp    BP 122/61 mmHg  Pulse 62  SpO2 96%  Opioid Risk Score:   Fall Risk Score:  `1  Depression screen PHQ 2/9  Depression screen [facility] 2/9 12/18/2014 11/19/2014 11/07/2013  Decreased Interest 0 0 0  Down, Depressed, Hopeless 1 0 0  PHQ - 2 Score 1 0 0      Review of Systems  Endocrine:       High blood sugar  All other systems reviewed and are negative.      Objective:   Physical Exam  Constitutional: She is oriented to person, place, and time. She appears well-developed.  Thin elderly female  HENT:  Head: Normocephalic and atraumatic.  Eyes: Conjunctivae and EOM are normal.  Pupils are equal, round, and reactive to light.  Cardiovascular: Normal rate, normal heart sounds and intact distal pulses.   Irregularly irregular  Pulmonary/Chest: Effort normal. She has no wheezes.  Diminished breath sounds at bases  Abdominal: Soft. Bowel sounds are normal.  Musculoskeletal:  Patient has tenderness with even very light palpation in the left flank area. This is between the 12th rib and iliac crest. She has no signs of skin rash, no masses palpable.  Neurological: She is alert and oriented to person, place, and time.  Motor strength is 4/5 bilateral deltoids, biceps, triceps, grip, hip flexor, knee extensor, ankle dorsal flexor and plantar flexor  Psychiatric: She has a normal mood and affect.  Nursing note and vitals reviewed. Sensation intact to light touch in bilateral upper and lower limbs        Assessment & Plan:  1. Chronic left-sided low back pain. It appears to be multifactorial. Her imaging studies including MRI demonstrates some facet arthropathy which certainly can cause some pain that may radiate laterally however she has tenderness with even light palpation over the quadratus lumborum muscle. She is a poor historian  It is difficult to say whether her pain got worse since the fall earlier this month. She does not have a clear-cut signs of radiculopathy  Trying to correlate her symptoms with the MRI does not give any obvious answers. She does have some radiographic evidence of potential L3 nerve root irritation on the left side. The L1 and L2 roots exit the spine  Without any evidence of compression.  Since this appears to be more of a localized phenomenon possibly muscular in the quadratus lumborum area have recommended Lidoderm patches. If she cannot get the prescription there are over-the-counter 4% patches now available. Given her history of falls, anticoagulation as well as possible history of pain medication misuse, would not Keep on long-term pain  medication I do think given the recent fall is reasonable to extend her current prescription by 2 weeks.   The patient has refused PT in the past  I will see the patient back on a when necessary basis if she does not have any improvements. We may consider medial branch blocks but she would have to come off of Eliquis for 2 days prior to the injection. Other potential injections would include left L3 nerve root block  Discussed my findings with the patient as well as her daughter

## 2014-12-19 ENCOUNTER — Telehealth: Payer: Self-pay | Admitting: Physical Medicine & Rehabilitation

## 2014-12-19 ENCOUNTER — Ambulatory Visit: Payer: Medicare Other | Admitting: Family

## 2014-12-19 LAB — PMP ALCOHOL METABOLITE (ETG): ETGU: NEGATIVE ng/mL

## 2014-12-19 NOTE — Telephone Encounter (Signed)
Please clarify (see previous message attached).  Notes do not indicate what directions were for pain med except continue current RX for 2 weeks. I spoke with daughter and she thought she understood the 2 q 4 hours as well.

## 2014-12-19 NOTE — Telephone Encounter (Signed)
Patients daughter Sarah [name] needs clarification on her prescription Vicodin--she received a phone call from her doctor telling her she needed to take 1 pill every 6hrs, but Dr. Letta Pate prescribed it 2 pills every 4hrs.  Please call to clarify [phone].

## 2014-12-19 NOTE — Telephone Encounter (Signed)
I spoke with Ms Sarah [name] and gave her Dr Letta Pate' recommendation. She said ok but then proceeded to verbalize how disappointed she was, and why did they even come to the appointment if her mother is no better off.  Ms Sarah [name] also asked about the instructions saying [name] was very adamant about 1 q 6 hour and what does she do?  I ex[plained if [name] is the prescriber then she needs to follow her instructions. Also, she was only given 56 pills and her mother would run out if she takes more.  I explained our policy about not prescribing on first visit and why it is our policy, as well as conservative care when someone is 81 years old-- due to high fall risk.  She says she understands but is "just very frustrated right now and doesn't even know why she brought her mother to the appointment. Dr Conley Canal sent her there for treatment for pain".  I acknowledged her frustration but reinforced that many PCP and ortho,/surgeons, send patients to a pain specialist because they are not willing to assume the responsibility for chronic pain treatment, but the referral does not guarantee that narcotics, or the amounts patients expect, will be prescribed by our physicians.

## 2014-12-19 NOTE — Telephone Encounter (Signed)
I would recommend one pill every 4 hours not 2

## 2014-12-20 NOTE — Telephone Encounter (Signed)
We can schedule her for medial branch blocks  If she would like a list of other pain management we can provide We are primarily PMR practice

## 2014-12-22 LAB — PRESCRIPTION MONITORING PROFILE (SOLSTAS)
Amphetamine/Meth: NEGATIVE ng/mL
BENZODIAZEPINE SCREEN, URINE: NEGATIVE ng/mL
BUPRENORPHINE, URINE: NEGATIVE ng/mL
Barbiturate Screen, Urine: NEGATIVE ng/mL
COCAINE METABOLITES: NEGATIVE ng/mL
CREATININE, URINE: 131.99 mg/dL (ref >20.0)
Cannabinoid Scrn, Ur: NEGATIVE ng/mL
Carisoprodol, Urine: NEGATIVE ng/mL
FENTANYL URINE: NEGATIVE ng/mL
MDMA URINE: NEGATIVE ng/mL
MEPERIDINE UR: NEGATIVE ng/mL
METHADONE SCREEN, URINE: NEGATIVE ng/mL
Nitrites, Initial: NEGATIVE ug/mL
Oxycodone Screen, Ur: NEGATIVE ng/mL
Propoxyphene: NEGATIVE ng/mL
TAPENTADOLUR: NEGATIVE ng/mL
Zolpidem, Urine: NEGATIVE ng/mL
pH, Initial: 5.4 pH (ref 4.5–8.9)

## 2014-12-22 LAB — OPIATES/OPIOIDS (LC/MS-MS)
Codeine Urine: NEGATIVE ng/mL (ref <50)
Hydrocodone: 4687 ng/mL (ref <50)
Hydromorphone: 398 ng/mL (ref <50)
Morphine Urine: NEGATIVE ng/mL (ref <50)
Norhydrocodone, Ur: 1513 ng/mL (ref <50)
Noroxycodone, Ur: NEGATIVE ng/mL (ref <50)
Oxycodone, ur: NEGATIVE ng/mL (ref <50)
Oxymorphone: NEGATIVE ng/mL (ref <50)

## 2014-12-22 LAB — TRAMADOL, URINE
N-DESMETHYL-CIS-TRAMADOL: 505 ng/mL — AB (ref <100)
Tramadol, Urine: 1832 ng/mL — AB (ref <100)

## 2014-12-26 NOTE — Telephone Encounter (Signed)
Which MBB do i need to schedule  - bi lateral or one sided - i want to allow enough time - once i call the patient

## 2014-12-26 NOTE — Telephone Encounter (Addendum)
One side, 15 min should be ok. We dould still do bilat if needed. Thx

## 2014-12-27 ENCOUNTER — Encounter: Payer: Self-pay | Admitting: Pulmonary Disease

## 2014-12-27 ENCOUNTER — Ambulatory Visit (INDEPENDENT_AMBULATORY_CARE_PROVIDER_SITE_OTHER): Payer: Medicare Other | Admitting: Pulmonary Disease

## 2014-12-27 ENCOUNTER — Other Ambulatory Visit: Payer: Self-pay | Admitting: Nurse Practitioner

## 2014-12-27 ENCOUNTER — Ambulatory Visit (INDEPENDENT_AMBULATORY_CARE_PROVIDER_SITE_OTHER)
Admission: RE | Admit: 2014-12-27 | Discharge: 2014-12-27 | Disposition: A | Discharge status: Home / Self Care | Payer: Medicare Other | Source: Ambulatory Visit | Attending: Pulmonary Disease | Admitting: Pulmonary Disease

## 2014-12-27 VITALS — BP 124/66 | HR 59 | Ht 62.0 in | Wt 97.8 lb

## 2014-12-27 DIAGNOSIS — J939 Pneumothorax, unspecified: Secondary | ICD-10-CM

## 2014-12-27 DIAGNOSIS — S270XXA Traumatic pneumothorax, initial encounter: Secondary | ICD-10-CM | POA: Diagnosis not present

## 2014-12-27 DIAGNOSIS — J449 Chronic obstructive pulmonary disease, unspecified: Secondary | ICD-10-CM | POA: Diagnosis not present

## 2014-12-27 DIAGNOSIS — S2241XD Multiple fractures of ribs, right side, subsequent encounter for fracture with routine healing: Secondary | ICD-10-CM | POA: Diagnosis not present

## 2014-12-27 NOTE — Assessment & Plan Note (Signed)
She has come to see me today because a recent chest x-ray showed a left-sided pneumothorax which is quite small. She has no respiratory complaints at all today. Her lung exam shows rhonchi bilaterally which is in keeping with her diagnosis of COPD, but she has symmetric air entry.  This small pneumothorax has likely healed by this point.  Plan: Chest x-ray today If the chest x-ray shows an enlarging pneumothorax then she will need to be admitted to the hospital for chest tube placement

## 2014-12-27 NOTE — Progress Notes (Signed)
Subjective:    Patient ID: Sarah [name], female    DOB: 15-Oct-1931, 81 y.o.   MRN: [id number]  HPI Chief Complaint  Patient presents with  . Advice Only    referred by Debbrah Alar NP for abn cxr taken post unexpected weight loss.     This is a pleasant 81 year old female who smokes one half pack of cigarettes daily who comes to my clinic today for evaluation of a pneumothorax. She was told in January of this year that she may have COPD when she was hospitalized for 3 days for "fluid in the lungs". Since then however she states that she has not had any respiratory complaints. Her daughter is with her and provides most of the history. She says that she smokes one half pack of cigarettes daily and keeps a sedentary lifestyle. However, they do not note significant cough, chest pain, or shortness of breath. They believe that she has dementia and lately she has been having delusional type thoughts about seeing her parents. She is about to undergo a psychiatric evaluation for this.  3 weeks ago yesterday she fell while putting away some ice cream in the freezer in her garage. She fell on a wooden couch on the right side and broke her ribs. She went to her primary care physician and had a chest x-ray which showed fractured ribs on the right side and a small left-sided pneumothorax. Despite the fall, she has not had any shortness of breath or cough.  She has been undergoing a workup recently for weight loss. Apparently she has lost about 18 pounds in the last 8 months. They are happy about the fact that she has gained 2.5 pounds in the last month. Apparently she is lined up for colonoscopy next month.   Past Medical History  Diagnosis Date  . Hypertension   . Hyperlipidemia   . Sleep apnea   . Atrial fibrillation with RVR     a. 06/2014 admitted and failed dccv x 2, amio/eliquis started;  b. CHA2DS2VASc = 6 (eliquis).  . Chronic combined systolic and diastolic CHF (congestive heart  failure)     a. 06/2014 TEE: EF 40-45%, no thrombus, mild MR.  . LBBB (left bundle branch block)   . Anemia   . Diabetes type 2, controlled   . Tobacco abuse   . Cardiomyopathy     a. 06/2014 TEE: EF 40-45%, no thrombus, mild MR.  Marland Kitchen COPD (chronic obstructive pulmonary disease)   . Elevated LFTs   . Pancreas divisum   . Aortic atherosclerosis   . Internal hemorrhoids   . Hyperplastic colon polyp      Family History  Problem Relation Age of Onset  . Diabetes    . Hyperlipidemia Neg Hx   . Heart attack Neg Hx   . Hypertension Neg Hx   . Sudden death Neg Hx   . Colon cancer      grandson     Social History   Social History  . Marital Status: Divorced    Spouse Name: N/A  . Number of Children: 2  . Years of Education: N/A   Occupational History  . retired    Social History Main Topics  . Smoking status: Current Every Day Smoker -- 1.50 packs/day for 50 years    Types: Cigarettes  . Smokeless tobacco: Never Used  . Alcohol Use: No  . Drug Use: No  . Sexual Activity: Not on file   Other Topics Concern  .  Not on file   Social History Narrative   Moved from [place], [place] 2009     Allergies  Allergen Reactions  . Morphine And Related Nausea And Vomiting     Outpatient Prescriptions Prior to Visit  Medication Sig Dispense Refill  . acetaminophen (TYLENOL) 500 MG tablet Take 500 mg by mouth every 6 (six) hours as needed.    Marland Kitchen alendronate (FOSAMAX) 70 MG tablet TAKE 1 TABLET BY MOUTH EVERY 7 DAYS. TAKE WITH A FULL GLASS OF WATER ON AN EMPTY STOMACH. 4 tablet 5  . ALPRAZolam (XANAX) 0.5 MG tablet     . amiodarone (PACERONE) 200 MG tablet TAKE 1 TABLET BY MOUTH DAILY 30 tablet 3  . apixaban (ELIQUIS) 2.5 MG TABS tablet Take 1 tablet (2.5 mg total) by mouth 2 (two) times daily. 60 tablet 11  . atorvastatin (LIPITOR) 40 MG tablet TAKE 1 TABLET (40 MG TOTAL) BY MOUTH DAILY. 30 tablet 5  . Black Cohosh 540 MG CAPS Take 540 mg by mouth daily.    . Calcium  Carbonate-Vitamin D (CALTRATE 600+D PO) Take 1 tablet by mouth 2 (two) times daily.    . Cholecalciferol (VITAMIN D3) 2000 UNITS TABS Take 1 tablet by mouth daily.      Marland Kitchen escitalopram (LEXAPRO) 10 MG tablet 1/2 tab by mouth once daily for 1 week, increase to a full tab once daily in 1 week. 30 tablet 0  . furosemide (LASIX) 40 MG tablet TAKE 1 TABLET BY MOUTH TWICE DAILY 60 tablet 6  . glucosamine-chondroitin 500-400 MG tablet Take 1 tablet by mouth 2 (two) times daily.    Marland Kitchen HYDROcodone-acetaminophen (NORCO/VICODIN) 5-325 MG per tablet Take 1 tablet by mouth every 6 (six) hours as needed. 56 tablet 0  . lidocaine (LIDODERM) 5 % Place 2 patches onto the skin daily. Remove & Discard patch within 12 hours or as directed by MD 60 patch 1  . metoprolol (LOPRESSOR) 50 MG tablet Take 0.5 tablets (25 mg total) by mouth daily.    . polycarbophil (FIBERCON) 625 MG tablet Take 625 mg by mouth daily.      . polyethylene glycol (MIRALAX / GLYCOLAX) packet Take 17 g by mouth daily.    . potassium chloride SA (K-DUR,KLOR-CON) 20 MEQ tablet TAKE 1 TABLET BY MOUTH DAILY. TAKE AT THE SAME TIME AS FUROSEMIDE 30 tablet 6  . diphenhydrAMINE (BENADRYL) 25 MG tablet Take 25 mg by mouth at bedtime as needed.     No facility-administered medications prior to visit.       Review of Systems  Constitutional: Negative for fever and unexpected weight change.  HENT: Negative for congestion, dental problem, ear pain, nosebleeds, postnasal drip, rhinorrhea, sinus pressure, sneezing, sore throat and trouble swallowing.   Eyes: Negative for redness and itching.  Respiratory: Negative for cough, chest tightness, shortness of breath and wheezing.   Cardiovascular: Negative for palpitations and leg swelling.  Gastrointestinal: Negative for nausea and vomiting.  Genitourinary: Negative for dysuria.  Musculoskeletal: Negative for joint swelling.  Skin: Negative for rash.  Neurological: Negative for headaches.  Hematological:  Does not bruise/bleed easily.  Psychiatric/Behavioral: Negative for dysphoric mood. The patient is not nervous/anxious.        Objective:   Physical Exam   Filed Vitals:   12/27/14 1641  BP: 124/66  Pulse: 59  Height: 5\' 2"  (1.575 m)  Weight: 97 lb 12.8 oz (44.362 kg)  SpO2: 97%    RA  Gen: frail, no acute distress HENT: NCAT,  OP clear, neck supple without masses Eyes: PERRL, EOMi Lymph: no cervical lymphadenopathy PULM: Rhonchi bilaterally, limited air movement, bilateral air entry CV: RRR, no mgr, no JVD GI: BS+, soft, nontender, no hsm Derm: no rash or skin breakdown MSK: normal bulk and tone Neuro: A&Ox4, CN II-XII intact, strength 5/5 in all 4 extremities Psyche: normal mood and affect  August 2016 chest x-ray images personally reviewed showing fractured ribs on the right, very small apical pneumothorax in the left, flattened diaphragms consistent with emphysema Recent primary care office visit reviewed where she was seen for chronic left lower back pain     Assessment & Plan:  Pneumothorax, traumatic She has come to see me today because a recent chest x-ray showed a left-sided pneumothorax which is quite small. She has no respiratory complaints at all today. Her lung exam shows rhonchi bilaterally which is in keeping with her diagnosis of COPD, but she has symmetric air entry.  This small pneumothorax has likely healed by this point.  Plan: Chest x-ray today If the chest x-ray shows an enlarging pneumothorax then she will need to be admitted to the hospital for chest tube placement  COPD (chronic obstructive pulmonary disease) She has a lengthy smoking history, emphysema on chest x-ray, and wheezing with rhonchi on exam today. She has not had lung function testing but I'm certain that she will end up having COPD at some point.  Given her recent pneumothorax, I am reluctant to perform simple spirometry until we see that she has had resolution of  that.  Plan: Return in October for spirometry testing Quit smoking     Current outpatient prescriptions:  .  acetaminophen (TYLENOL) 500 MG tablet, Take 500 mg by mouth every 6 (six) hours as needed., Disp: , Rfl:  .  alendronate (FOSAMAX) 70 MG tablet, TAKE 1 TABLET BY MOUTH EVERY 7 DAYS. TAKE WITH A FULL GLASS OF WATER ON AN EMPTY STOMACH., Disp: 4 tablet, Rfl: 5 .  ALPRAZolam (XANAX) 0.5 MG tablet, , Disp: , Rfl:  .  amiodarone (PACERONE) 200 MG tablet, TAKE 1 TABLET BY MOUTH DAILY, Disp: 30 tablet, Rfl: 3 .  apixaban (ELIQUIS) 2.5 MG TABS tablet, Take 1 tablet (2.5 mg total) by mouth 2 (two) times daily., Disp: 60 tablet, Rfl: 11 .  atorvastatin (LIPITOR) 40 MG tablet, TAKE 1 TABLET (40 MG TOTAL) BY MOUTH DAILY., Disp: 30 tablet, Rfl: 5 .  Black Cohosh 540 MG CAPS, Take 540 mg by mouth daily., Disp: , Rfl:  .  Calcium Carbonate-Vitamin D (CALTRATE 600+D PO), Take 1 tablet by mouth 2 (two) times daily., Disp: , Rfl:  .  Cholecalciferol (VITAMIN D3) 2000 UNITS TABS, Take 1 tablet by mouth daily.  , Disp: , Rfl:  .  escitalopram (LEXAPRO) 10 MG tablet, 1/2 tab by mouth once daily for 1 week, increase to a full tab once daily in 1 week., Disp: 30 tablet, Rfl: 0 .  furosemide (LASIX) 40 MG tablet, TAKE 1 TABLET BY MOUTH TWICE DAILY, Disp: 60 tablet, Rfl: 6 .  glucosamine-chondroitin 500-400 MG tablet, Take 1 tablet by mouth 2 (two) times daily., Disp: , Rfl:  .  HYDROcodone-acetaminophen (NORCO/VICODIN) 5-325 MG per tablet, Take 1 tablet by mouth every 6 (six) hours as needed., Disp: 56 tablet, Rfl: 0 .  lidocaine (LIDODERM) 5 %, Place 2 patches onto the skin daily. Remove & Discard patch within 12 hours or as directed by MD, Disp: 60 patch, Rfl: 1 .  metoprolol (LOPRESSOR) 50 MG tablet,  Take 0.5 tablets (25 mg total) by mouth daily., Disp: , Rfl:  .  polycarbophil (FIBERCON) 625 MG tablet, Take 625 mg by mouth daily.  , Disp: , Rfl:  .  polyethylene glycol (MIRALAX / GLYCOLAX) packet, Take  17 g by mouth daily., Disp: , Rfl:  .  potassium chloride SA (K-DUR,KLOR-CON) 20 MEQ tablet, TAKE 1 TABLET BY MOUTH DAILY. TAKE AT THE SAME TIME AS FUROSEMIDE, Disp: 30 tablet, Rfl: 6

## 2014-12-27 NOTE — Assessment & Plan Note (Signed)
She has a lengthy smoking history, emphysema on chest x-ray, and wheezing with rhonchi on exam today. She has not had lung function testing but I'm certain that she will end up having COPD at some point.  Given her recent pneumothorax, I am reluctant to perform simple spirometry until we see that she has had resolution of that.  Plan: Return in October for spirometry testing Quit smoking

## 2014-12-27 NOTE — Patient Instructions (Signed)
We will call you with the results of the chest x-ray Come back in October and we will do a breathing test to check for COPD Quit smoking

## 2014-12-28 NOTE — Progress Notes (Signed)
Urine drug screen for this encounter is consistent for prescribed medication 

## 2014-12-31 ENCOUNTER — Ambulatory Visit: Payer: Medicare Other | Admitting: Physical Medicine & Rehabilitation

## 2015-01-04 ENCOUNTER — Encounter: Payer: Self-pay | Admitting: Family

## 2015-01-09 ENCOUNTER — Ambulatory Visit (INDEPENDENT_AMBULATORY_CARE_PROVIDER_SITE_OTHER): Payer: Medicare Other | Admitting: Family

## 2015-01-09 ENCOUNTER — Encounter: Payer: Self-pay | Admitting: Family

## 2015-01-09 ENCOUNTER — Ambulatory Visit (HOSPITAL_BASED_OUTPATIENT_CLINIC_OR_DEPARTMENT_OTHER)
Admission: RE | Admit: 2015-01-09 | Discharge: 2015-01-09 | Disposition: A | Discharge status: Home / Self Care | Payer: Medicare Other | Source: Ambulatory Visit | Attending: Family | Admitting: Family

## 2015-01-09 VITALS — BP 108/60 | HR 60 | Temp 98.2°F | Resp 16 | Ht 62.0 in | Wt 97.6 lb

## 2015-01-09 DIAGNOSIS — R634 Abnormal weight loss: Secondary | ICD-10-CM | POA: Diagnosis not present

## 2015-01-09 DIAGNOSIS — R51 Headache: Secondary | ICD-10-CM | POA: Insufficient documentation

## 2015-01-09 DIAGNOSIS — G319 Degenerative disease of nervous system, unspecified: Secondary | ICD-10-CM | POA: Diagnosis not present

## 2015-01-09 DIAGNOSIS — S0990XA Unspecified injury of head, initial encounter: Secondary | ICD-10-CM

## 2015-01-09 NOTE — Progress Notes (Signed)
Pre visit review using our clinic review tool, if applicable. No additional management support is needed unless otherwise documented below in the visit note. 

## 2015-01-09 NOTE — Progress Notes (Signed)
Subjective:    Patient ID: Sarah [name], female    DOB: 1931/10/14, 81 y.o.   MRN: [id number]  HPI  Sarah [name] is an 81 yr old female who presents today for follow up.  1) Loss of weight- Her weight has remained stable since her last visit.  Wt Readings from Last 3 Encounters:  01/09/15 97 lb 9.6 oz (44.271 kg)  12/27/14 97 lb 12.8 oz (44.362 kg)  12/13/14 95 lb 6.4 oz (43.273 kg)   2) Fall- pt reported a fall in the bathtub 1 week ago.  She placed a mat down and put a suction cup handle. She reports some low back pain since the fall.  Ambulating without difficulty.  She reports that she did hit her head when she fell.    Review of Systems    see HPI  Past Medical History  Diagnosis Date  . Hypertension   . Hyperlipidemia   . Sleep apnea   . Atrial fibrillation with RVR     a. 06/2014 admitted and failed dccv x 2, amio/eliquis started;  b. CHA2DS2VASc = 6 (eliquis).  . Chronic combined systolic and diastolic CHF (congestive heart failure)     a. 06/2014 TEE: EF 40-45%, no thrombus, mild MR.  . LBBB (left bundle branch block)   . Anemia   . Diabetes type 2, controlled   . Tobacco abuse   . Cardiomyopathy     a. 06/2014 TEE: EF 40-45%, no thrombus, mild MR.  Marland Kitchen COPD (chronic obstructive pulmonary disease)   . Elevated LFTs   . Pancreas divisum   . Aortic atherosclerosis   . Internal hemorrhoids   . Hyperplastic colon polyp     Social History   Social History  . Marital Status: Divorced    Spouse Name: N/A  . Number of Children: 2  . Years of Education: N/A   Occupational History  . retired    Social History Main Topics  . Smoking status: Current Every Day Smoker -- 1.50 packs/day for 50 years    Types: Cigarettes  . Smokeless tobacco: Never Used  . Alcohol Use: No  . Drug Use: No  . Sexual Activity: Not on file   Other Topics Concern  . Not on file   Social History Narrative   Moved from [place], [place] 2009    Past Surgical History    Procedure Laterality Date  . Abdominal hysterectomy  1965  . Hemorroidectomy  1957  . Breast lumpectomy  1966, 1971  . Tee without cardioversion N/A 06/04/2014    Procedure: TRANSESOPHAGEAL ECHOCARDIOGRAM (TEE);  Surgeon: Thayer Headings, MD;  Location: [facility];  Service: Cardiovascular;  Laterality: N/A;  . Cardioversion N/A 06/04/2014    Procedure: CARDIOVERSION;  Surgeon: Thayer Headings, MD;  Location: [facility] ENDOSCOPY;  Service: Cardiovascular;  Laterality: N/A;    Family History  Problem Relation Age of Onset  . Diabetes    . Hyperlipidemia Neg Hx   . Heart attack Neg Hx   . Hypertension Neg Hx   . Sudden death Neg Hx   . Colon cancer      grandson    Allergies  Allergen Reactions  . Morphine And Related Nausea And Vomiting    Current Outpatient Prescriptions on File Prior to Visit  Medication Sig Dispense Refill  . acetaminophen (TYLENOL) 500 MG tablet Take 500 mg by mouth every 6 (six) hours as needed.    Marland Kitchen alendronate (FOSAMAX) 70 MG tablet TAKE 1 TABLET  BY MOUTH EVERY 7 DAYS. TAKE WITH A FULL GLASS OF WATER ON AN EMPTY STOMACH. 4 tablet 5  . ALPRAZolam (XANAX) 0.5 MG tablet     . amiodarone (PACERONE) 200 MG tablet TAKE 1 TABLET BY MOUTH DAILY 30 tablet 3  . apixaban (ELIQUIS) 2.5 MG TABS tablet Take 1 tablet (2.5 mg total) by mouth 2 (two) times daily. 60 tablet 11  . atorvastatin (LIPITOR) 40 MG tablet TAKE 1 TABLET (40 MG TOTAL) BY MOUTH DAILY. 30 tablet 5  . Black Cohosh 540 MG CAPS Take 540 mg by mouth daily.    . Calcium Carbonate-Vitamin D (CALTRATE 600+D PO) Take 1 tablet by mouth 2 (two) times daily.    . Cholecalciferol (VITAMIN D3) 2000 UNITS TABS Take 1 tablet by mouth daily.      Marland Kitchen escitalopram (LEXAPRO) 10 MG tablet 1/2 tab by mouth once daily for 1 week, increase to a full tab once daily in 1 week. 30 tablet 0  . furosemide (LASIX) 40 MG tablet TAKE 1 TABLET BY MOUTH TWICE DAILY 60 tablet 6  . glucosamine-chondroitin 500-400 MG tablet Take 1 tablet by  mouth 2 (two) times daily.    Marland Kitchen HYDROcodone-acetaminophen (NORCO/VICODIN) 5-325 MG per tablet Take 1 tablet by mouth every 6 (six) hours as needed. 56 tablet 0  . lidocaine (LIDODERM) 5 % Place 2 patches onto the skin daily. Remove & Discard patch within 12 hours or as directed by MD 60 patch 1  . metoprolol (LOPRESSOR) 50 MG tablet Take 0.5 tablets (25 mg total) by mouth daily.    . polycarbophil (FIBERCON) 625 MG tablet Take 625 mg by mouth daily.      . polyethylene glycol (MIRALAX / GLYCOLAX) packet Take 17 g by mouth daily.    . potassium chloride SA (K-DUR,KLOR-CON) 20 MEQ tablet TAKE 1 TABLET BY MOUTH DAILY. TAKE AT THE SAME TIME AS FUROSEMIDE 30 tablet 6   No current facility-administered medications on file prior to visit.    BP 108/60 mmHg  Pulse 60  Temp(Src) 98.2 F (36.8 C) (Oral)  Resp 16  Ht 5\' 2"  (1.575 m)  Wt 97 lb 9.6 oz (44.271 kg)  BMI 17.85 kg/m2  SpO2 95%    Objective:   Physical Exam  Constitutional: She is oriented to person, place, and time.  Thin white female  HENT:  Head: Normocephalic.  Eyes: No scleral icterus.  Cardiovascular: Normal rate, regular rhythm and normal heart sounds.   No murmur heard. Pulmonary/Chest: Effort normal and breath sounds normal. No respiratory distress. She has no wheezes.  Musculoskeletal: She exhibits no edema.  Neurological: She is alert and oriented to person, place, and time.  Skin: Skin is warm and dry.  Psychiatric: She has a normal mood and affect. Her behavior is normal. Judgment and thought content normal.          Assessment & Plan:

## 2015-01-09 NOTE — Patient Instructions (Signed)
Please complete CT head on the first floor.

## 2015-01-12 DIAGNOSIS — S0990XA Unspecified injury of head, initial encounter: Secondary | ICD-10-CM | POA: Insufficient documentation

## 2015-01-12 NOTE — Assessment & Plan Note (Signed)
Weight has stabilized.  Continue carnation instant breakfast bid.

## 2015-01-12 NOTE — Assessment & Plan Note (Signed)
CT head is performed which is negative for bleed.  We discussed a shower chair- pt refuses.

## 2015-01-16 ENCOUNTER — Telehealth: Payer: Self-pay | Admitting: *Deleted

## 2015-01-16 MED ORDER — ESCITALOPRAM OXALATE 10 MG PO TABS: 10.0000 mg | ORAL_TABLET | Freq: Every day | ORAL | Status: DC

## 2015-01-16 NOTE — Telephone Encounter (Signed)
Received fax from [facility] requesting refill of lexapro. Refill sent.

## 2015-01-21 ENCOUNTER — Other Ambulatory Visit: Payer: Self-pay | Admitting: Physician Assistant

## 2015-01-21 NOTE — Telephone Encounter (Signed)
Please review for refill, Thank you. 

## 2015-01-21 NOTE — Telephone Encounter (Signed)
Agreed. Not seen in [place]. Route to [facility] cardiologist please.

## 2015-01-21 NOTE — Telephone Encounter (Signed)
NOT  pt

## 2015-01-21 NOTE — Telephone Encounter (Signed)
Dr. Crenshaw's pt. °

## 2015-01-24 ENCOUNTER — Encounter: Payer: Self-pay | Admitting: Family

## 2015-01-24 DIAGNOSIS — W19XXXA Unspecified fall, initial encounter: Secondary | ICD-10-CM

## 2015-01-25 ENCOUNTER — Other Ambulatory Visit: Payer: Medicare Other

## 2015-01-25 DIAGNOSIS — W19XXXA Unspecified fall, initial encounter: Secondary | ICD-10-CM | POA: Diagnosis not present

## 2015-01-25 DIAGNOSIS — R399 Unspecified symptoms and signs involving the genitourinary system: Secondary | ICD-10-CM | POA: Diagnosis not present

## 2015-01-26 LAB — URINALYSIS, ROUTINE W REFLEX MICROSCOPIC
Bilirubin Urine: NEGATIVE
Glucose, UA: NEGATIVE
HGB URINE DIPSTICK: NEGATIVE
Ketones, ur: NEGATIVE
LEUKOCYTES UA: NEGATIVE
NITRITE: NEGATIVE
PROTEIN: NEGATIVE
Specific Gravity, Urine: 1.013 (ref 1.001–1.035)
pH: 6.5 (ref 5.0–8.0)

## 2015-01-27 DIAGNOSIS — R269 Unspecified abnormalities of gait and mobility: Secondary | ICD-10-CM | POA: Diagnosis not present

## 2015-01-27 DIAGNOSIS — J449 Chronic obstructive pulmonary disease, unspecified: Secondary | ICD-10-CM | POA: Diagnosis not present

## 2015-01-27 DIAGNOSIS — R296 Repeated falls: Secondary | ICD-10-CM | POA: Diagnosis not present

## 2015-01-27 DIAGNOSIS — I10 Essential (primary) hypertension: Secondary | ICD-10-CM | POA: Diagnosis not present

## 2015-01-27 DIAGNOSIS — I447 Left bundle-branch block, unspecified: Secondary | ICD-10-CM | POA: Diagnosis not present

## 2015-01-27 DIAGNOSIS — I5042 Chronic combined systolic (congestive) and diastolic (congestive) heart failure: Secondary | ICD-10-CM | POA: Diagnosis not present

## 2015-01-27 DIAGNOSIS — Z7901 Long term (current) use of anticoagulants: Secondary | ICD-10-CM | POA: Diagnosis not present

## 2015-01-27 DIAGNOSIS — Z720 Tobacco use: Secondary | ICD-10-CM | POA: Diagnosis not present

## 2015-01-27 DIAGNOSIS — E785 Hyperlipidemia, unspecified: Secondary | ICD-10-CM | POA: Diagnosis not present

## 2015-01-27 DIAGNOSIS — E119 Type 2 diabetes mellitus without complications: Secondary | ICD-10-CM | POA: Diagnosis not present

## 2015-01-27 DIAGNOSIS — Z9181 History of falling: Secondary | ICD-10-CM | POA: Diagnosis not present

## 2015-01-27 DIAGNOSIS — I4891 Unspecified atrial fibrillation: Secondary | ICD-10-CM | POA: Diagnosis not present

## 2015-01-27 LAB — URINE CULTURE: Colony Count: 75000

## 2015-01-28 ENCOUNTER — Encounter: Payer: Self-pay | Admitting: Family

## 2015-01-28 ENCOUNTER — Telehealth: Payer: Self-pay | Admitting: Cardiology

## 2015-01-28 ENCOUNTER — Ambulatory Visit: Payer: Medicare Other | Admitting: Family

## 2015-01-28 ENCOUNTER — Telehealth: Payer: Self-pay | Admitting: Family

## 2015-01-28 DIAGNOSIS — L84 Corns and callosities: Secondary | ICD-10-CM | POA: Diagnosis not present

## 2015-01-28 DIAGNOSIS — E119 Type 2 diabetes mellitus without complications: Secondary | ICD-10-CM | POA: Diagnosis not present

## 2015-01-28 DIAGNOSIS — B351 Tinea unguium: Secondary | ICD-10-CM | POA: Diagnosis not present

## 2015-01-28 DIAGNOSIS — M79672 Pain in left foot: Secondary | ICD-10-CM | POA: Diagnosis not present

## 2015-01-28 DIAGNOSIS — M79671 Pain in right foot: Secondary | ICD-10-CM | POA: Diagnosis not present

## 2015-01-28 MED ORDER — CEPHALEXIN 500 MG PO CAPS: 500.0000 mg | ORAL_CAPSULE | Freq: Two times a day (BID) | ORAL | Status: DC

## 2015-01-28 NOTE — Telephone Encounter (Signed)
Notified pts daughter and she voices understanding. She requests an updated medication list with notations re: what each medication is for. Advised her I will leave list at front desk for her to pick up.

## 2015-01-28 NOTE — Telephone Encounter (Signed)
Pt's daughter called in wanting to inform Dr. Stanford Breed on the pt's dosage change in her Metoprolol. She says that Dr. Stanford Breed instructed her to take 1 pill po BID but due to some recent falls her PCP Lemar Livings adjusted the medication to 1/2 BID. [name] wanted to make sure that Dr. Stanford Breed was all right with the change. Please f/u with her if you need to.  Thanks

## 2015-01-28 NOTE — Telephone Encounter (Signed)
Ok with change Kirk Ruths

## 2015-01-28 NOTE — Telephone Encounter (Signed)
Will forward for dr crenshaw review  

## 2015-01-28 NOTE — Telephone Encounter (Signed)
Urine culture shows mild UTI, rx sent for keflex.

## 2015-01-29 ENCOUNTER — Encounter: Payer: Self-pay | Admitting: Family

## 2015-01-29 NOTE — Addendum Note (Signed)
Addended by: Tasia Catchings on: 01/29/2015 06:42 PM   Modules accepted: Orders, Medications

## 2015-01-30 DIAGNOSIS — Z9181 History of falling: Secondary | ICD-10-CM | POA: Diagnosis not present

## 2015-01-30 DIAGNOSIS — R296 Repeated falls: Secondary | ICD-10-CM | POA: Diagnosis not present

## 2015-01-30 DIAGNOSIS — Z7901 Long term (current) use of anticoagulants: Secondary | ICD-10-CM | POA: Diagnosis not present

## 2015-01-30 DIAGNOSIS — E785 Hyperlipidemia, unspecified: Secondary | ICD-10-CM | POA: Diagnosis not present

## 2015-01-30 DIAGNOSIS — I5042 Chronic combined systolic (congestive) and diastolic (congestive) heart failure: Secondary | ICD-10-CM | POA: Diagnosis not present

## 2015-01-30 DIAGNOSIS — I447 Left bundle-branch block, unspecified: Secondary | ICD-10-CM | POA: Diagnosis not present

## 2015-01-30 DIAGNOSIS — J449 Chronic obstructive pulmonary disease, unspecified: Secondary | ICD-10-CM | POA: Diagnosis not present

## 2015-01-30 DIAGNOSIS — I4891 Unspecified atrial fibrillation: Secondary | ICD-10-CM | POA: Diagnosis not present

## 2015-01-30 DIAGNOSIS — E119 Type 2 diabetes mellitus without complications: Secondary | ICD-10-CM | POA: Diagnosis not present

## 2015-01-30 DIAGNOSIS — Z720 Tobacco use: Secondary | ICD-10-CM | POA: Diagnosis not present

## 2015-01-30 DIAGNOSIS — R269 Unspecified abnormalities of gait and mobility: Secondary | ICD-10-CM | POA: Diagnosis not present

## 2015-01-30 DIAGNOSIS — I10 Essential (primary) hypertension: Secondary | ICD-10-CM | POA: Diagnosis not present

## 2015-01-30 NOTE — Telephone Encounter (Signed)
Spoke with pt dtr, Aware of dr crenshaw's recommendations.  

## 2015-02-01 DIAGNOSIS — R296 Repeated falls: Secondary | ICD-10-CM | POA: Diagnosis not present

## 2015-02-01 DIAGNOSIS — E785 Hyperlipidemia, unspecified: Secondary | ICD-10-CM | POA: Diagnosis not present

## 2015-02-01 DIAGNOSIS — I5042 Chronic combined systolic (congestive) and diastolic (congestive) heart failure: Secondary | ICD-10-CM | POA: Diagnosis not present

## 2015-02-01 DIAGNOSIS — R269 Unspecified abnormalities of gait and mobility: Secondary | ICD-10-CM | POA: Diagnosis not present

## 2015-02-01 DIAGNOSIS — Z720 Tobacco use: Secondary | ICD-10-CM | POA: Diagnosis not present

## 2015-02-01 DIAGNOSIS — E119 Type 2 diabetes mellitus without complications: Secondary | ICD-10-CM | POA: Diagnosis not present

## 2015-02-01 DIAGNOSIS — I4891 Unspecified atrial fibrillation: Secondary | ICD-10-CM | POA: Diagnosis not present

## 2015-02-01 DIAGNOSIS — Z7901 Long term (current) use of anticoagulants: Secondary | ICD-10-CM | POA: Diagnosis not present

## 2015-02-01 DIAGNOSIS — I447 Left bundle-branch block, unspecified: Secondary | ICD-10-CM | POA: Diagnosis not present

## 2015-02-01 DIAGNOSIS — Z9181 History of falling: Secondary | ICD-10-CM | POA: Diagnosis not present

## 2015-02-01 DIAGNOSIS — J449 Chronic obstructive pulmonary disease, unspecified: Secondary | ICD-10-CM | POA: Diagnosis not present

## 2015-02-01 DIAGNOSIS — I10 Essential (primary) hypertension: Secondary | ICD-10-CM | POA: Diagnosis not present

## 2015-02-06 DIAGNOSIS — I5042 Chronic combined systolic (congestive) and diastolic (congestive) heart failure: Secondary | ICD-10-CM | POA: Diagnosis not present

## 2015-02-06 DIAGNOSIS — E785 Hyperlipidemia, unspecified: Secondary | ICD-10-CM | POA: Diagnosis not present

## 2015-02-06 DIAGNOSIS — E119 Type 2 diabetes mellitus without complications: Secondary | ICD-10-CM | POA: Diagnosis not present

## 2015-02-06 DIAGNOSIS — Z720 Tobacco use: Secondary | ICD-10-CM | POA: Diagnosis not present

## 2015-02-06 DIAGNOSIS — I10 Essential (primary) hypertension: Secondary | ICD-10-CM | POA: Diagnosis not present

## 2015-02-06 DIAGNOSIS — Z9181 History of falling: Secondary | ICD-10-CM | POA: Diagnosis not present

## 2015-02-06 DIAGNOSIS — Z7901 Long term (current) use of anticoagulants: Secondary | ICD-10-CM | POA: Diagnosis not present

## 2015-02-06 DIAGNOSIS — R269 Unspecified abnormalities of gait and mobility: Secondary | ICD-10-CM | POA: Diagnosis not present

## 2015-02-06 DIAGNOSIS — R296 Repeated falls: Secondary | ICD-10-CM | POA: Diagnosis not present

## 2015-02-06 DIAGNOSIS — I4891 Unspecified atrial fibrillation: Secondary | ICD-10-CM | POA: Diagnosis not present

## 2015-02-06 DIAGNOSIS — J449 Chronic obstructive pulmonary disease, unspecified: Secondary | ICD-10-CM | POA: Diagnosis not present

## 2015-02-06 DIAGNOSIS — I447 Left bundle-branch block, unspecified: Secondary | ICD-10-CM | POA: Diagnosis not present

## 2015-02-08 DIAGNOSIS — Z7901 Long term (current) use of anticoagulants: Secondary | ICD-10-CM | POA: Diagnosis not present

## 2015-02-08 DIAGNOSIS — E785 Hyperlipidemia, unspecified: Secondary | ICD-10-CM | POA: Diagnosis not present

## 2015-02-08 DIAGNOSIS — R296 Repeated falls: Secondary | ICD-10-CM | POA: Diagnosis not present

## 2015-02-08 DIAGNOSIS — I447 Left bundle-branch block, unspecified: Secondary | ICD-10-CM | POA: Diagnosis not present

## 2015-02-08 DIAGNOSIS — I5042 Chronic combined systolic (congestive) and diastolic (congestive) heart failure: Secondary | ICD-10-CM | POA: Diagnosis not present

## 2015-02-08 DIAGNOSIS — R269 Unspecified abnormalities of gait and mobility: Secondary | ICD-10-CM | POA: Diagnosis not present

## 2015-02-08 DIAGNOSIS — Z720 Tobacco use: Secondary | ICD-10-CM | POA: Diagnosis not present

## 2015-02-08 DIAGNOSIS — E119 Type 2 diabetes mellitus without complications: Secondary | ICD-10-CM | POA: Diagnosis not present

## 2015-02-08 DIAGNOSIS — J449 Chronic obstructive pulmonary disease, unspecified: Secondary | ICD-10-CM | POA: Diagnosis not present

## 2015-02-08 DIAGNOSIS — I10 Essential (primary) hypertension: Secondary | ICD-10-CM | POA: Diagnosis not present

## 2015-02-08 DIAGNOSIS — Z9181 History of falling: Secondary | ICD-10-CM | POA: Diagnosis not present

## 2015-02-08 DIAGNOSIS — I4891 Unspecified atrial fibrillation: Secondary | ICD-10-CM | POA: Diagnosis not present

## 2015-02-13 DIAGNOSIS — R296 Repeated falls: Secondary | ICD-10-CM | POA: Diagnosis not present

## 2015-02-13 DIAGNOSIS — I10 Essential (primary) hypertension: Secondary | ICD-10-CM | POA: Diagnosis not present

## 2015-02-13 DIAGNOSIS — Z7901 Long term (current) use of anticoagulants: Secondary | ICD-10-CM | POA: Diagnosis not present

## 2015-02-13 DIAGNOSIS — I447 Left bundle-branch block, unspecified: Secondary | ICD-10-CM | POA: Diagnosis not present

## 2015-02-13 DIAGNOSIS — Z720 Tobacco use: Secondary | ICD-10-CM | POA: Diagnosis not present

## 2015-02-13 DIAGNOSIS — R269 Unspecified abnormalities of gait and mobility: Secondary | ICD-10-CM | POA: Diagnosis not present

## 2015-02-13 DIAGNOSIS — E785 Hyperlipidemia, unspecified: Secondary | ICD-10-CM | POA: Diagnosis not present

## 2015-02-13 DIAGNOSIS — Z9181 History of falling: Secondary | ICD-10-CM | POA: Diagnosis not present

## 2015-02-13 DIAGNOSIS — I4891 Unspecified atrial fibrillation: Secondary | ICD-10-CM | POA: Diagnosis not present

## 2015-02-13 DIAGNOSIS — E119 Type 2 diabetes mellitus without complications: Secondary | ICD-10-CM | POA: Diagnosis not present

## 2015-02-13 DIAGNOSIS — J449 Chronic obstructive pulmonary disease, unspecified: Secondary | ICD-10-CM | POA: Diagnosis not present

## 2015-02-13 DIAGNOSIS — I5042 Chronic combined systolic (congestive) and diastolic (congestive) heart failure: Secondary | ICD-10-CM | POA: Diagnosis not present

## 2015-02-20 ENCOUNTER — Ambulatory Visit (INDEPENDENT_AMBULATORY_CARE_PROVIDER_SITE_OTHER): Payer: Medicare Other | Admitting: Psychiatry

## 2015-02-20 ENCOUNTER — Encounter (HOSPITAL_COMMUNITY): Payer: Self-pay | Admitting: Psychiatry

## 2015-02-20 ENCOUNTER — Telehealth: Payer: Self-pay | Admitting: Family

## 2015-02-20 VITALS — BP 108/60 | HR 64 | Ht 61.0 in | Wt 103.8 lb

## 2015-02-20 DIAGNOSIS — F0391 Unspecified dementia with behavioral disturbance: Secondary | ICD-10-CM

## 2015-02-20 DIAGNOSIS — F331 Major depressive disorder, recurrent, moderate: Secondary | ICD-10-CM

## 2015-02-20 MED ORDER — HYDROXYZINE PAMOATE 25 MG PO CAPS: ORAL_CAPSULE | ORAL | Status: DC

## 2015-02-20 NOTE — Telephone Encounter (Signed)
Received call from Dr.  Casimiro Needle re: his concern about pt's possible dementia.  He advised neuro referral. Will arrange.   Please notify daughter.

## 2015-02-20 NOTE — Progress Notes (Signed)
Psychiatric Initial Adult Assessment   Patient Identification: Sarah [name] MRN:  [id number] Date of Evaluation:  02/20/2015 Referral Source: Dr. Debbrah Alar Chief Complaint:   Visit Diagnosis: Major depression remission Diagnosis:  Major depression, remission  This patient is an 81 year old white female is being brought to the esophagus I her daughter Sarah [name] who lives with her. The patient is being brought because of behavioral disturbances according to her daughter. These are mainly related to operating small equipment like microwaves in her home. She clearly is seems to be demonstrating apraxias. According to her daughter the patient is asking questions over and over again and is demonstrating short-term memory losses. The patient is divorced as of the 26s and has 2 children Sarah [name] who she lives with an Sarah [name] her son. The patient lives with Sarah [name] but also lives with Sarah [name] her granddaughter and to ran children. In January the patient had a significant medical issues or heart hospitalization. It appears she had congestive heart failure. The patient denies depression. The patient's daughter Sarah [name] says that the patient is very manipulative. The patient is sleeping and eating well. Her concentration is only fair. The patient does all her basic ADLs but no longer drives no longer takes her medication as Sarah [name] her daughter does all these things. The patient is not suicidal now and never has been. There is no evidence of alcohol. The patient shows no evidence of psychosis. She is no specific sets of anxiety symptoms consistent with an anxiety disorder. The patient has no past psychiatric history. She's never seen a psychiatrist or been evaluated in the hospital. [facility] because of some behavioral disturbances but in the close examination of those behaviors are more typical of a demented individual. 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 and uses a  cigarette lighter inappropriately. Patient Active Problem List   Diagnosis Date Noted  . Head trauma [S09.90XA] 01/12/2015  . Pneumothorax, traumatic [S27.0XXA] 12/27/2014  . Chronic anticoagulation [Z79.01] 12/13/2014  . Abnormal findings on imaging of biliary tract [R93.2] 12/13/2014  . Bradycardia [R00.1] 11/25/2014  . Blepharitis [H01.009] 11/02/2014  . Anxiety [F41.9] 10/22/2014  . Chronic combined systolic and diastolic CHF (congestive heart failure) ([place]) [I50.42]   . Hypertension [I10]   . Hyperlipidemia [E78.5]   . Tobacco abuse [Z72.0]   . COPD (chronic obstructive pulmonary disease) ([place]) [J44.9]   . Rectal bleeding [K62.5] 06/25/2014  . CN (constipation) [K59.00] 06/25/2014  . Diabetes type 2, controlled ([place]) [E11.9] 06/19/2014  . Chronic back pain [M54.9, G89.29] 06/19/2014  . Constipation [K59.00] 06/19/2014  . LBBB (left bundle branch block) [I44.7] 06/05/2014  . Hypokalemia [E87.6] 06/05/2014  . Acute combined systolic and diastolic heart failure ([place]) [I50.41] 06/05/2014  . LV dysfunction [I51.9] 06/05/2014  . Anemia [D64.9] 06/05/2014  . Atrial fibrillation with RVR ([place]) [I48.91] 06/04/2014  . OSA (obstructive sleep apnea) [G47.33] 05/15/2014  . Insomnia [G47.00] 11/07/2013  . Colon cancer screening [Z12.11] 09/26/2013  . Borderline diabetes [R73.03] 05/18/2013  . Hyperkalemia [E87.5] 10/28/2012  . Loss of weight [R63.4] 10/14/2012  . Overactive bladder [N32.81] 06/10/2012  . Left shoulder pain [M25.512] 12/07/2011  . Back pain [M54.9] 01/16/2011  . Microscopic hematuria [R31.29] 01/16/2011  . Hot flashes [N95.1] 01/16/2011  . Tobacco user [Z72.0] 09/08/2010  . Hyperlipemia [E78.5] 12/27/2009  . Essential hypertension [I10] 12/27/2009  . Osteopenia [M85.80] 12/27/2009  . HOARSENESS, CHRONIC [R49.0] 12/27/2009  . ABDOMINAL BRUIT [R09.89] 12/27/2009   History of Present Illness:  Elements:   Associated Signs/Symptoms: Depression Symptoms:   (Hypo)  Manic Symptoms:   Anxiety Symptoms:   Psychotic Symptoms:   PTSD Symptoms:   Past Medical History:  Past Medical History  Diagnosis Date  . Hypertension   . Hyperlipidemia   . Sleep apnea   . Atrial fibrillation with RVR ([place])     a. 06/2014 admitted and failed dccv x 2, amio/eliquis started;  b. CHA2DS2VASc = 6 (eliquis).  . Chronic combined systolic and diastolic CHF (congestive heart failure) ([place])     a. 06/2014 TEE: EF 40-45%, no thrombus, mild MR.  . LBBB (left bundle branch block)   . Anemia   . Diabetes type 2, controlled ([place])   . Tobacco abuse   . Cardiomyopathy ([place])     a. 06/2014 TEE: EF 40-45%, no thrombus, mild MR.  Marland Kitchen COPD (chronic obstructive pulmonary disease) ([place])   . Elevated LFTs   . Pancreas divisum   . Aortic atherosclerosis ([place])   . Internal hemorrhoids   . Hyperplastic colon polyp     Past Surgical History  Procedure Laterality Date  . Abdominal hysterectomy  1965  . Hemorroidectomy  1957  . Breast lumpectomy  1966, 1971  . Tee without cardioversion N/A 06/04/2014    Procedure: TRANSESOPHAGEAL ECHOCARDIOGRAM (TEE);  Surgeon: Thayer Headings, MD;  Location: [facility];  Service: Cardiovascular;  Laterality: N/A;  . Cardioversion N/A 06/04/2014    Procedure: CARDIOVERSION;  Surgeon: Thayer Headings, MD;  Location: [facility] ENDOSCOPY;  Service: Cardiovascular;  Laterality: N/A;   Family History:  Family History  Problem Relation Age of Onset  . Diabetes    . Hyperlipidemia Neg Hx   . Heart attack Neg Hx   . Hypertension Neg Hx   . Sudden death Neg Hx   . Colon cancer      grandson   Social History:   Social History   Social History  . Marital Status: Divorced    Spouse Name: N/A  . Number of Children: 2  . Years of Education: N/A   Occupational History  . retired    Social History Main Topics  . Smoking status: Current Every Day Smoker -- 1.25 packs/day for 50 years    Types: Cigarettes  . Smokeless tobacco: Never Used  . Alcohol Use: No   . Drug Use: No  . Sexual Activity: Not on file   Other Topics Concern  . Not on file   Social History Narrative   Moved from [place], [place] 2009   Additional Social History:   Musculoskeletal: Strength & Muscle Tone: within normal limits Gait & Station: normal Patient leans: Right  Psychiatric Specialty Exam: HPI  ROS  Blood pressure 108/60, pulse 64, height 5\' 1"  (1.549 m), weight 103 lb 12.8 oz (47.083 kg).Body mass index is 19.62 kg/(m^2).  General Appearance: Casual  Eye Contact:  Good  Speech:  Clear and Coherent  Volume:  Normal  Mood:  Euthymic  Affect:  Blunt  Thought Process:  Goal Directed  Orientation:  Full (Time, Place, and Person)  Thought Content:  WDL  Suicidal Thoughts:  No  Homicidal Thoughts:  No  Memory:  NA  Judgement:  Good  Insight:  Fair  Psychomotor Activity:  Decreased  Concentration:  Fair  Recall: Poor   Fund of Knowledge:Fair  Language: Poor  Akathisia:  No  Handed:  Right  AIMS (if indicated):    Assets:  Desire for Improvement  ADL's:  Intact  Cognition: WNL  Sleep:     Is the patient at risk to self?  No. Has the patient been a risk to self in the past 6 months?  No. Has the patient been a risk to self within the distant past?  No. Is the patient a risk to others?  No. Has the patient been a risk to others in the past 6 months?  No. Has the patient been a risk to others within the distant past?  No.  Allergies:   Allergies  Allergen Reactions  . Morphine And Related Nausea And Vomiting   Current Medications: Current Outpatient Prescriptions  Medication Sig Dispense Refill  . acetaminophen (TYLENOL) 500 MG tablet Take 500 mg by mouth every 6 (six) hours as needed.    Marland Kitchen alendronate (FOSAMAX) 70 MG tablet TAKE 1 TABLET BY MOUTH EVERY 7 DAYS. TAKE WITH A FULL GLASS OF WATER ON AN EMPTY STOMACH. 4 tablet 5  . amiodarone (PACERONE) 200 MG tablet TAKE 1 TABLET BY MOUTH DAILY 30 tablet 3  . apixaban (ELIQUIS) 2.5 MG  TABS tablet Take 1 tablet (2.5 mg total) by mouth 2 (two) times daily. 60 tablet 11  . atorvastatin (LIPITOR) 40 MG tablet TAKE 1 TABLET (40 MG TOTAL) BY MOUTH DAILY. 30 tablet 5  . Black Cohosh 540 MG CAPS Take 540 mg by mouth daily.    . Calcium Carbonate-Vitamin D (CALTRATE 600+D PO) Take 1 tablet by mouth 2 (two) times daily.    . cephALEXin (KEFLEX) 500 MG capsule Take 1 capsule (500 mg total) by mouth 2 (two) times daily. 14 capsule 0  . Cholecalciferol (VITAMIN D3) 2000 UNITS TABS Take 1 tablet by mouth daily.      Mariane Baumgarten Sodium (DOC-Q-LACE PO) Take by mouth.    . escitalopram (LEXAPRO) 10 MG tablet Take 1 tablet (10 mg total) by mouth daily. 30 tablet 5  . furosemide (LASIX) 40 MG tablet TAKE 1 TABLET BY MOUTH TWICE DAILY 60 tablet 6  . glucosamine-chondroitin 500-400 MG tablet Take 1 tablet by mouth 2 (two) times daily.    Marland Kitchen lidocaine (LIDODERM) 5 % Place 2 patches onto the skin daily. Remove & Discard patch within 12 hours or as directed by MD 60 patch 1  . metoprolol (LOPRESSOR) 50 MG tablet Take 0.5 tablets (25 mg total) by mouth daily.    . metoprolol (LOPRESSOR) 50 MG tablet TAKE 1 TABLET BY MOUTH TWICE DAILY 60 tablet 5  . polycarbophil (FIBERCON) 625 MG tablet Take 625 mg by mouth daily.      . polyethylene glycol (MIRALAX / GLYCOLAX) packet Take 17 g by mouth daily.    . potassium chloride SA (K-DUR,KLOR-CON) 20 MEQ tablet TAKE 1 TABLET BY MOUTH DAILY. TAKE AT THE SAME TIME AS FUROSEMIDE 30 tablet 6   No current facility-administered medications for this visit.    Previous Psychotropic Medications: Yes   Substance Abuse History in the last 12 months:  No.  Consequences of Substance Abuse: NA  Medical Decision Making:    Treatment Plan Summary: At this time it is evident this patient is likely showing signs of mild dementia. Today she had a Mini-Mental Status exam and scored a value 25. Given her age of being 2 score should've been at least a 42. She has a high  school education. According to her daughter she is asking repeated questions and demonstrating multiple signs indicative of early mild dementia. Today I called her primary care doctor and discussed with her that this patient doesn't  really have significant behavioral disturbances of dementia. That is this patient is not violent or agitated, she's not clinically depressed, she doesn't have significant anxiety, she's not suicidal she's not trying to elope. Overall is no specific psychiatric setting of symptoms that are is a problem. What is noted the patient has not been evaluated yet for Alzheimer's dementia. This evaluation should take place with at least a CAT scan in some baseline blood work. Notice she's no medications for memory impairment. At this time psychiatrically the patient is stable. She actually is doing fairly well. She does come out of her room and stays busy playing with her cats and her grandchildren. The patient is still able to enjoy things. At this time I see no reason for this patient to return. The patient and her primary care Dr. have agreed in the next few weeks to be reevaluated and be assessed for a dementing process. At this time are only intervention will be offering some Vistaril 25 mg 1 or 2 for mild episodes of anxiety.     Dillian Feig IRVING 10/19/20162:00 PM

## 2015-02-21 ENCOUNTER — Ambulatory Visit: Payer: Medicare Other | Admitting: Pulmonary Disease

## 2015-02-21 NOTE — Telephone Encounter (Signed)
Left a message for call back.  

## 2015-02-21 NOTE — Telephone Encounter (Signed)
[name] (patient's daughter) made aware.  She agrees with plan.  No questions or concerns voiced.  Order placed.

## 2015-02-27 ENCOUNTER — Encounter: Payer: Self-pay | Admitting: Internal Medicine

## 2015-02-27 ENCOUNTER — Ambulatory Visit (AMBULATORY_SURGERY_CENTER): Payer: Medicare Other | Admitting: Internal Medicine

## 2015-02-27 VITALS — BP 103/59 | HR 51 | Temp 98.1°F | Resp 24 | Ht 62.0 in | Wt 95.0 lb

## 2015-02-27 DIAGNOSIS — R634 Abnormal weight loss: Secondary | ICD-10-CM | POA: Diagnosis present

## 2015-02-27 DIAGNOSIS — D125 Benign neoplasm of sigmoid colon: Secondary | ICD-10-CM | POA: Diagnosis not present

## 2015-02-27 DIAGNOSIS — I509 Heart failure, unspecified: Secondary | ICD-10-CM | POA: Diagnosis not present

## 2015-02-27 DIAGNOSIS — K625 Hemorrhage of anus and rectum: Secondary | ICD-10-CM | POA: Diagnosis not present

## 2015-02-27 MED ORDER — SODIUM CHLORIDE 0.9 % IV SOLN: 500.0000 mL | INTRAVENOUS | Status: DC

## 2015-02-27 NOTE — Progress Notes (Signed)
  [facility] Post-op Note  Patient: Sarah [name]  Procedure(s) Performed: colonoscopy and endoscopy  Patient Location: [facility] - Recovery Area  Anesthesia Type: Deep Sedation/Propofol  Level of Consciousness: awake, oriented and patient cooperative  Airway and Oxygen Therapy: Patient Spontanous Breathing  Post-op Pain: none  Post-op Assessment:  Post-op Vital signs reviewed, Patient's Cardiovascular Status Stable, Respiratory Function Stable, Patent Airway, No signs of Nausea or vomiting and Pain level controlled  Post-op Vital Signs: Reviewed and stable  Complications: No apparent anesthesia complications  Qunicy Higinbotham E 2:12 PM

## 2015-02-27 NOTE — Patient Instructions (Signed)

## 2015-02-27 NOTE — Op Note (Signed)
[facility]  [address]. [place], [address]   ENDOSCOPY PROCEDURE REPORT  PATIENT: Sarah [name], Sarah [name]  MR#: [id number] BIRTHDATE: 1931/05/12 , 82  yrs. old GENDER: female ENDOSCOPIST: Jerene Bears, MD REFERRED BY:  Debbrah Alar, FNP PROCEDURE DATE:  02/27/2015 PROCEDURE:  EGD, diagnostic ASA CLASS:     Class III INDICATIONS:  weight loss.  history of CBD dilation MEDICATIONS: Monitored anesthesia care and Propofol 100 mg IV TOPICAL ANESTHETIC: none  DESCRIPTION OF PROCEDURE: After the risks benefits and alternatives of the procedure were thoroughly explained, informed consent was obtained.  The LB [id number] endoscope was introduced through the mouth and advanced to the second portion of the duodenum , Without limitations.  The instrument was slowly withdrawn as the mucosa was fully examined.   ESOPHAGUS: Scattered white exudates consistent with candidiasis were found in the proximal esophagus and mid esophagus.   The z-line was located 36cm from the incisors.  The z-line appeared irregular, but no evidence for esophagitis or definite Barrett's change. No nodularity.  STOMACH: A small hiatal hernia was noted.   The mucosa of the stomach appeared normal.  DUODENUM: The duodenal mucosa showed no abnormalities in the bulb and 2nd part of the duodenum.  The ampullary orifice was not visible but bile was seen in the second portion of the duodenum.  Retroflexed views revealed a hiatal hernia.     The scope was then withdrawn from the patient and the procedure completed.  COMPLICATIONS: There were no immediate complications.    ENDOSCOPIC IMPRESSION: 1.   White exudates consistent with candidiasis in the proximal esophagus and mid esophagus 2.   The z-line was located 36cm from the incisors and Slightly irregular without obvious Barrett's esophagus 3.   Small hiatal hernia 4.   The mucosa of the stomach appeared normal 5.   The duodenal  mucosa showed no abnormalities in the bulb and 2nd part of the duodenum  RECOMMENDATIONS: Proceed with a Colonoscopy.  eSigned:  Jerene Bears, MD 02/27/2015 2:10 PM    CC:O'Sullivan, Melissa FNP and The Patient  PATIENT NAME:  Stormey, Wilborn MR#: [id number]

## 2015-02-27 NOTE — Op Note (Addendum)
[facility]  [address]. [place], [address]   COLONOSCOPY PROCEDURE REPORT  PATIENT: Sarah [name], Sarah [name]  MR#: [id number] BIRTHDATE: 10-Apr-1932 , 82  yrs. old GENDER: female ENDOSCOPIST: Jerene Bears, MD REFERRED DU:KGURKYH O' Sullivan, N.P. PROCEDURE DATE:  02/27/2015 PROCEDURE:   Colonoscopy, diagnostic First Screening Colonoscopy - Avg.  risk and is 50 yrs.  old or older - No.  Prior Negative Screening - Now for repeat screening. N/A  History of Adenoma - Now for follow-up colonoscopy & has been > or = to 3 yrs.  N/A  Polyps removed today? No ASA CLASS:   Class III INDICATIONS:weight loss. MEDICATIONS: Monitored anesthesia care, this was the total dose used for all procedures at this session, and Propofol 200 mg IV  DESCRIPTION OF PROCEDURE:   After the risks benefits and alternatives of the procedure were thoroughly explained, informed consent was obtained.  The digital rectal exam revealed no rectal mass.   The [id number]  endoscope was introduced through the anus and advanced to the cecum, which was identified by both the appendix and ileocecal valve. No adverse events experienced.   The quality of the prep was fair.  (Suprep was used)  The instrument was then slowly withdrawn as the colon was fully examined. Estimated blood loss is zero unless otherwise noted in this procedure report.   COLON FINDINGS: A sessile polyp measuring 5 mm in size was found in the sigmoid colon.  this polyp was benign appearing and not removed due to the patient being on Eliquis without interruption.  A few diminutive and sessile polyps with the typical appearance of hyperplastic polyps were found in the rectosigmoid colon.  Again these polyps were not removed due to anticoagulation.  There was mild diverticulosis noted in the ascending colon.  The colonic mucosa was otherwise unremarkable. Retroflexed views revealed internal hemorrhoids. The time to cecum = 6.4  Withdrawal time = 11.6   The scope was withdrawn and the procedure completed.  COMPLICATIONS: There were no immediate complications.      ENDOSCOPIC IMPRESSION: 1.   Sessile polyp was found in the sigmoid colon, benign and not removed due to anticoagulation 2.   Few sessile polyps were found in the rectosigmoid colon; typical appearance for hyperplastic polyps, benign and not removed due to anticoagulation 3.   There was mild diverticulosis noted in the ascending colon 4.   Internal hemorrhoids 5.   No luminal GI source identified today to explain weight loss  RECOMMENDATIONS: Follow-up with primary care and GI as needed Common bile duct and mild pancreatic duct dilation is stable when compared to CT scan from July 2016 and prior MRCP.  eSigned:  Jerene Bears, MD 02/27/2015 2:19 PM   cc:  the patient, PCP   PATIENT NAME:  [name], [name] MR#: [id number]

## 2015-02-28 ENCOUNTER — Telehealth: Payer: Self-pay | Admitting: *Deleted

## 2015-02-28 NOTE — Telephone Encounter (Signed)
  Follow up Call-  Call back number 02/27/2015  Permission to leave phone message Yes     Patient questions:  Do you have a fever, pain , or abdominal swelling? No. Pain Score  0 *  Have you tolerated food without any problems? Yes.    Have you been able to return to your normal activities? Yes.    Do you have any questions about your discharge instructions: Diet   No. Medications  No. Follow up visit  No.  Do you have questions or concerns about your Care? No.  Actions: * If pain score is 4 or above: No action needed, pain <4.

## 2015-03-22 ENCOUNTER — Telehealth: Payer: Self-pay | Admitting: Family

## 2015-03-22 NOTE — Telephone Encounter (Signed)
Left detailed message on daughter's voicemail re: below information.

## 2015-03-22 NOTE — Telephone Encounter (Signed)
Daughter was requesting names of providers she has seen.  Per [facility]:   Dr. Simonne Maffucci Dr. Zenovia Jarred Dr. Alysia Penna Dr. Kirk Ruths

## 2015-04-02 ENCOUNTER — Other Ambulatory Visit: Payer: Self-pay | Admitting: Family

## 2015-04-15 ENCOUNTER — Encounter: Payer: Self-pay | Admitting: Neurology

## 2015-04-15 ENCOUNTER — Other Ambulatory Visit (INDEPENDENT_AMBULATORY_CARE_PROVIDER_SITE_OTHER): Payer: Medicare Other

## 2015-04-15 ENCOUNTER — Ambulatory Visit (INDEPENDENT_AMBULATORY_CARE_PROVIDER_SITE_OTHER): Payer: Medicare Other | Admitting: Neurology

## 2015-04-15 VITALS — BP 100/68 | HR 72 | Wt 107.0 lb

## 2015-04-15 DIAGNOSIS — F0390 Unspecified dementia without behavioral disturbance: Secondary | ICD-10-CM

## 2015-04-15 DIAGNOSIS — F03A0 Unspecified dementia, mild, without behavioral disturbance, psychotic disturbance, mood disturbance, and anxiety: Secondary | ICD-10-CM

## 2015-04-15 LAB — TSH: TSH: 0.7 u[IU]/mL (ref 0.35–4.50)

## 2015-04-15 LAB — VITAMIN B12: Vitamin B-12: 331 pg/mL (ref 211–911)

## 2015-04-15 MED ORDER — DONEPEZIL HCL 10 MG PO TABS: ORAL_TABLET | ORAL | Status: DC

## 2015-04-15 NOTE — Progress Notes (Signed)
NEUROLOGY CONSULTATION NOTE  Sarah [name] MRN: [id number] DOB: 1932/01/29  Referring provider: Debbrah Alar, FNP Primary care provider: Debbrah Alar, FNP  Reason for consult:  Concern for dementia  Thank you for your kind referral of Sarah [name] for consultation of the above symptoms. Although her history is well known to you, please allow me to reiterate it for the purpose of our medical record. The patient was accompanied to the clinic by her daughter who also provides collateral information. Records and images were personally reviewed where available.  HISTORY OF PRESENT ILLNESS: This is a pleasant 81 year old right-handed woman with a history of hypertension, hyperlipidemia, paroxysmal atrial fibrillation on anticoagulation with Eliquis, presenting for evaluation of behavioral changes that were evaluated by Psychiatry and felt to be related to underlying dementia. Her daughter reports that over the past 6 months, her mother has been having odd behaviors, one time she did not think the house was cold enough and tried to use her lighter. When her daughter tried to get the lighter, she got upset and tried to bite her. If she sees the light or timer turned on for the microwave or dryer, she would turn them on even if empty. She pulled the folding door off it's hinges last night. She has become a Ship broker, per daughter she has "always been a selfish woman," but now more prominent with food where she would make sure she has her own. Her daughter has noticed problems with hygiene, she needs help with bathing. She had previously been living in [place] where she was having some issues with bills, paying her mortgage with her credit card. Since moving to [place] with her daughter 7 years ago, she has stopped paying bills and stopped driving. Her daughter reports she repeats herself. She did not recall that her son was here last June/July. Over the past year, her daughter fills her pillbox  for her, and she is pretty good with remembering to take them, however her daughter feels she could not arrange them for herself. She has had 3-4 falls in the past year, underwent home PT a few months ago but could not comprehend how to use her cane correctly. She does not recall when her last fall was.   She denies any headaches, dizziness, diplopia, dysarthria, dysphagia, neck/back pain, focal numbness/tingling/weakness, bowel/bladder dysfunction. No anosmia, tremors. She denies any significant head injuries or regular alcohol intake. No family history of dementia. She lives with her daughter, granddaughter, and 2 great-grandchildren, as well as her 2 cats. Her daughter reported some agitation that the Lexapro had helped with.   I personally reviewed head CT without contrast done 01/2015 after her fall, there were no acute changes. There was mild diffuse atrophy and mild chronic microvascular disease.   Laboratory Data: Lab Results  Component Value Date   WBC 9.7 08/08/2014   HGB 14.0 08/08/2014   HCT 41.1 08/08/2014   MCV 95.6 08/08/2014   PLT 323 08/08/2014     Chemistry      Component Value Date/Time   NA 137 10/22/2014 0810   K 3.8 10/22/2014 0810   CL 99 10/22/2014 0810   CO2 31 10/22/2014 0810   BUN 18 10/22/2014 0810   CREATININE 1.08 10/22/2014 0810   CREATININE 0.90 08/08/2014 0812      Component Value Date/Time   CALCIUM 10.1 10/22/2014 0810   ALKPHOS 81 08/08/2014 0812   AST 24 08/08/2014 0812   ALT 19 08/08/2014 0812   BILITOT  0.5 08/08/2014 [id number]      PAST MEDICAL HISTORY: Past Medical History  Diagnosis Date  . Hypertension   . Hyperlipidemia   . Sleep apnea   . Atrial fibrillation with RVR ([place])     a. 06/2014 admitted and failed dccv x 2, amio/eliquis started;  b. CHA2DS2VASc = 6 (eliquis).  . Chronic combined systolic and diastolic CHF (congestive heart failure) ([place])     a. 06/2014 TEE: EF 40-45%, no thrombus, mild MR.  . LBBB (left bundle branch block)     . Anemia   . Diabetes type 2, controlled ([place])   . Tobacco abuse   . Cardiomyopathy ([place])     a. 06/2014 TEE: EF 40-45%, no thrombus, mild MR.  Marland Kitchen COPD (chronic obstructive pulmonary disease) ([place] )   . Elevated LFTs   . Pancreas divisum   . Aortic atherosclerosis ([place])   . Internal hemorrhoids   . Hyperplastic colon polyp     PAST SURGICAL HISTORY: Past Surgical History  Procedure Laterality Date  . Abdominal hysterectomy  1965  . Hemorroidectomy  1957  . Breast lumpectomy  1966, 1971  . Tee without cardioversion N/A 06/04/2014    Procedure: TRANSESOPHAGEAL ECHOCARDIOGRAM (TEE);  Surgeon: Thayer Headings, MD;  Location: [facility];  Service: Cardiovascular;  Laterality: N/A;  . Cardioversion N/A 06/04/2014    Procedure: CARDIOVERSION;  Surgeon: Thayer Headings, MD;  Location: [facility] ENDOSCOPY;  Service: Cardiovascular;  Laterality: N/A;    MEDICATIONS: Current Outpatient Prescriptions on File Prior to Visit  Medication Sig Dispense Refill  . acetaminophen (TYLENOL) 500 MG tablet Take 500 mg by mouth every 6 (six) hours as needed.    Marland Kitchen alendronate (FOSAMAX) 70 MG tablet TAKE 1 TABLET BY MOUTH EVERY 7 DAYS. TAKE WITH A FULL GLASS OF WATER ON AN EMPTY STOMACH. 4 tablet 5  . amiodarone (PACERONE) 200 MG tablet TAKE 1 TABLET BY MOUTH DAILY 30 tablet 3  . apixaban (ELIQUIS) 2.5 MG TABS tablet Take 1 tablet (2.5 mg total) by mouth 2 (two) times daily. 60 tablet 11  . atorvastatin (LIPITOR) 40 MG tablet TAKE 1 TABLET (40 MG TOTAL) BY MOUTH DAILY. 30 tablet 5  . Black Cohosh 540 MG CAPS Take 540 mg by mouth daily.    . Calcium Carbonate-Vitamin D (CALTRATE 600+D PO) Take 1 tablet by mouth 2 (two) times daily.    . Cholecalciferol (VITAMIN D3) 2000 UNITS TABS Take 1 tablet by mouth daily.      Mariane Baumgarten Sodium (DOC-Q-LACE PO) Take by mouth.    . escitalopram (LEXAPRO) 10 MG tablet Take 1 tablet (10 mg total) by mouth daily. 30 tablet 5  . furosemide (LASIX) 40 MG tablet TAKE 1 TABLET BY MOUTH  TWICE DAILY 60 tablet 6  . glucosamine-chondroitin 500-400 MG tablet Take 1 tablet by mouth 2 (two) times daily.    . hydrOXYzine (VISTARIL) 25 MG capsule 1  Or 2   qday for anxiety 30 capsule 1  . lidocaine (LIDODERM) 5 % Place 2 patches onto the skin daily. Remove & Discard patch within 12 hours or as directed by MD 60 patch 1  . metoprolol (LOPRESSOR) 50 MG tablet Take 0.5 tablets (25 mg total) by mouth daily.    . polycarbophil (FIBERCON) 625 MG tablet Take 625 mg by mouth daily.      . polyethylene glycol (MIRALAX / GLYCOLAX) packet Take 17 g by mouth daily as needed.     . potassium chloride SA (K-DUR,KLOR-CON) 20 MEQ tablet  TAKE 1 TABLET BY MOUTH DAILY. TAKE AT THE SAME TIME AS FUROSEMIDE 30 tablet 6   No current facility-administered medications on file prior to visit.    ALLERGIES: Allergies  Allergen Reactions  . Morphine And Related Nausea And Vomiting    FAMILY HISTORY: Family History  Problem Relation Age of Onset  . Diabetes    . Hyperlipidemia Neg Hx   . Heart attack Neg Hx   . Hypertension Neg Hx   . Sudden death Neg Hx   . Colon cancer      grandson    SOCIAL HISTORY: Social History   Social History  . Marital Status: Divorced    Spouse Name: N/A  . Number of Children: 2  . Years of Education: N/A   Occupational History  . retired    Social History Main Topics  . Smoking status: Current Every Day Smoker -- 1.25 packs/day for 50 years    Types: Cigarettes  . Smokeless tobacco: Never Used  . Alcohol Use: No  . Drug Use: No  . Sexual Activity: Not on file   Other Topics Concern  . Not on file   Social History Narrative   Moved from [place], [place] 2009    REVIEW OF SYSTEMS: Constitutional: No fevers, chills, or sweats, no generalized fatigue, change in appetite Eyes: No visual changes, double vision, eye pain Ear, nose and throat: No hearing loss, ear pain, nasal congestion, sore throat Cardiovascular: No chest pain,  palpitations Respiratory:  No shortness of breath at rest or with exertion, wheezes GastrointestinaI: No nausea, vomiting, diarrhea, abdominal pain, fecal incontinence Genitourinary:  No dysuria, urinary retention or frequency Musculoskeletal:  No neck pain, back pain Integumentary: No rash, pruritus, skin lesions Neurological: as above Psychiatric: No depression, insomnia, anxiety Endocrine: No palpitations, fatigue, diaphoresis, mood swings, change in appetite, change in weight, increased thirst Hematologic/Lymphatic:  No anemia, purpura, petechiae. Allergic/Immunologic: no itchy/runny eyes, nasal congestion, recent allergic reactions, rashes  PHYSICAL EXAM: Filed Vitals:   04/15/15 0844  BP: 100/68  Pulse: 72   General: No acute distress Head:  Normocephalic/atraumatic Eyes: Fundoscopic exam shows bilateral sharp discs, no vessel changes, exudates, or hemorrhages Neck: supple, no paraspinal tenderness, full range of motion Back: No paraspinal tenderness Heart: regular rate and rhythm Lungs: Clear to auscultation bilaterally. Vascular: No carotid bruits. Skin/Extremities: No rash, no edema Neurological Exam: Mental status: alert and oriented to person, place, and time, no dysarthria or aphasia, Fund of knowledge is appropriate.  Remote memory intact.  Attention and concentration are normal.    Able to name objects and repeat phrases. 3/5  MMSE - Mini Mental State Exam 04/15/2015  Orientation to time 4  Orientation to Place 5  Registration 3  Attention/ Calculation 4  Recall 1  Language- name 2 objects 2  Language- repeat 1  Language- follow 3 step command 2  Language- read & follow direction 1  Write a sentence 1  Copy design 0  Total score 24   Cranial nerves: CN I: not tested CN II: pupils equal, round and reactive to light, visual fields intact, fundi unremarkable. CN III, IV, VI:  full range of motion, no nystagmus, no ptosis CN V: facial sensation intact CN VII:  upper and lower face symmetric CN VIII: hearing intact to finger rub CN IX, X: gag intact, uvula midline CN XI: sternocleidomastoid and trapezius muscles intact CN XII: tongue midline Bulk & Tone: normal, no cogwheeling, no fasciculations. Motor: 5/5 throughout with no pronator drift.  Sensation: decreased vibration to ankles bilaterally, otherwise intact to light touch, cold, pin,and joint position sense.  No extinction to double simultaneous stimulation.  Romberg test mild sway Deep Tendon Reflexes: +2 throughout, no ankle clonus Plantar responses: downgoing bilaterally Cerebellar: no incoordination on finger to nose, heel to shin. No dysdiadochokinesia Gait: cautious and unsteady, no ataxia, mild sway to one side Tremor: none  IMPRESSION: This is an 81 year old right-handed woman with a history of hypertension, hyperlipidemia, atrial fibrillation on Eliquis, presenting for worsening memory loss and behavioral changes. Her MMSE today is 24/30. She had previously been evaluated by Psychiatry last 02/20/15 with an MMSE of 25/30 for behavioral changes felt related to underlying dementia with no specific psychiatric symptoms of depression and anxiety or psychosis. We discussed MMSE findings of 24/30, indicating mild dementia. Her head CT did not show any acute changes, with age-related diffuse atrophy noted. Check TSH, B12, RPR as part of dementia workup. She may benefit from starting a cholinesterase inhibitor such as Aricept, side effects and expectations from the medication were discussed. Start 5mg  daily for 2 weeks, then increase to 10mg  daily. We discussed the importance of physical exercise and brain stimulation exercises for brain health. We also discussed continued monitoring of home safety, she may eventually need 24/7 care, they will discuss getting more help at home/social work consult with her PCP. She will follow-up in 6 months and knows to call for any changes.   Thank you for allowing  me to participate in the care of this patient. Please do not hesitate to call for any questions or concerns.   Ellouise Newer, M.D.  CC: Debbrah Alar

## 2015-04-15 NOTE — Patient Instructions (Signed)
1. Bloodwork for TSH, B12, RPR 2. Start Aricept 10mg : take 1/2 tablet daily for 2 weeks, then increase to 1 tablet daily 3. Physical exercise and brain stimulation exercises are important for brain health 4. Discuss getting more help at home/social work with your primary care doctor 5. Follow-up in 6 months, call for any changes

## 2015-04-16 ENCOUNTER — Telehealth: Payer: Self-pay | Admitting: Family Medicine

## 2015-04-16 ENCOUNTER — Other Ambulatory Visit: Payer: Self-pay | Admitting: Pulmonary Disease

## 2015-04-16 DIAGNOSIS — R0600 Dyspnea, unspecified: Secondary | ICD-10-CM

## 2015-04-16 LAB — RPR

## 2015-04-16 NOTE — Telephone Encounter (Signed)
Pt daughter returned your call please call her at [phone]

## 2015-04-16 NOTE — Telephone Encounter (Signed)
Left msg on vm for patient's daughter/Susan to rtn my call.

## 2015-04-16 NOTE — Telephone Encounter (Signed)
Returned call to [name] and notified her of patient's results.

## 2015-04-16 NOTE — Telephone Encounter (Signed)
-----   Message from Cameron Sprang, MD sent at 04/16/2015  9:12 AM EST ----- Pls let her know bloodwork is normal, normal thyroid and B12 levels. Thanks

## 2015-04-17 ENCOUNTER — Other Ambulatory Visit (HOSPITAL_COMMUNITY): Payer: Self-pay | Admitting: Psychiatry

## 2015-04-17 ENCOUNTER — Encounter: Payer: Self-pay | Admitting: Pulmonary Disease

## 2015-04-17 ENCOUNTER — Ambulatory Visit (INDEPENDENT_AMBULATORY_CARE_PROVIDER_SITE_OTHER): Payer: Medicare Other | Admitting: Pulmonary Disease

## 2015-04-17 VITALS — BP 116/62 | HR 56 | Ht 62.0 in | Wt 110.0 lb

## 2015-04-17 DIAGNOSIS — F0390 Unspecified dementia without behavioral disturbance: Secondary | ICD-10-CM

## 2015-04-17 DIAGNOSIS — S270XXA Traumatic pneumothorax, initial encounter: Secondary | ICD-10-CM | POA: Diagnosis not present

## 2015-04-17 DIAGNOSIS — R938 Abnormal findings on diagnostic imaging of other specified body structures: Secondary | ICD-10-CM

## 2015-04-17 DIAGNOSIS — F03A0 Unspecified dementia, mild, without behavioral disturbance, psychotic disturbance, mood disturbance, and anxiety: Secondary | ICD-10-CM

## 2015-04-17 DIAGNOSIS — Z23 Encounter for immunization: Secondary | ICD-10-CM | POA: Diagnosis not present

## 2015-04-17 DIAGNOSIS — R9389 Abnormal findings on diagnostic imaging of other specified body structures: Secondary | ICD-10-CM

## 2015-04-17 DIAGNOSIS — R0600 Dyspnea, unspecified: Secondary | ICD-10-CM

## 2015-04-17 DIAGNOSIS — J432 Centrilobular emphysema: Secondary | ICD-10-CM

## 2015-04-17 DIAGNOSIS — R1310 Dysphagia, unspecified: Secondary | ICD-10-CM | POA: Diagnosis not present

## 2015-04-17 NOTE — Assessment & Plan Note (Signed)
She has moderate centrilobular emphysema secondary to ongoing tobacco use. She explained to me today that she will never quit smoking and she will "die happy". She is interested in no further workup, medication, or smoking cessation. Because she remains sedentary she has minimal dyspnea at this point. I tried briefly to convince her to quit smoking, but we both laughed because we realized that it was a waste of my time.  Plan: Prevnar and flu shot today Counseled to quit smoking Follow-up when necessary

## 2015-04-17 NOTE — Assessment & Plan Note (Signed)
She had a traumatic pneumothorax back in the summertime and is of August she still had a small left-sided pneumothorax. Today on exam her lungs are clear and she has symmetric air entry and no symptoms of a pneumothorax. I recommended a chest x-ray and she flatly refused.

## 2015-04-17 NOTE — Assessment & Plan Note (Signed)
I believe that because of her dementia she had difficulty with simple spirometry testing today. She refuses further intervention.

## 2015-04-17 NOTE — Patient Instructions (Signed)
I recommend that you stop smoking I recommend that you see speech therapy for swallowing difficulty Let us know if you develop trouble breathing and then we will see you again

## 2015-04-17 NOTE — Assessment & Plan Note (Signed)
I first layer of either the images of her CT chest from July 2016 which shows moderate centrilobular emphysema but then also some fibrotic versus bronchiectatic changes in the right lower lobe. Considering her age and purported diagnosis of dementia this most likely represents aspiration pneumonia.  I explained to them that the differential diagnosis of fibrosis is broad but she is not interested in any surgery further workup part from a dysphagia workup.  Plan: Speech therapy evaluation

## 2015-04-17 NOTE — Addendum Note (Signed)
Addended by: Jerrol Banana on: 04/17/2015 10:24 AM   Modules accepted: Orders

## 2015-04-17 NOTE — Assessment & Plan Note (Signed)
See my discussion below about the abnormality on her CT scan of her chest. She does endorse some degree of dysphagia from time to time. It's difficult to determine the type of food or the situation. However, we had a lengthy conversation about how the pneumonia episode she had back in the summer of 2016 was likely related to aspiration pneumonia considering the fact that she has chronic right lower lobe fibrotic changes. After some lengthy discussion she was willing to see a speech therapist.  Plan: Speech therapy evaluation for dysphasia

## 2015-04-17 NOTE — Progress Notes (Signed)
Subjective:    Patient ID: Sarah [name], female    DOB: 05/06/1931, 81 y.o.   MRN: [id number]  Synopsis: Sarah [name] was first referred to [facility] in 2016 for likely COPD in the setting of weight loss.  She was unable to perform spirometry secondary to perceived dementia issues.  Notably, she had a spontaneous pneumothorax in 2016.   July 2016 CT scan of the chest: Moderate centrilobular emphysema throughout, questionable fibrotic versus bronchiectatic changes in the right lower lobe.  HPI Chief Complaint  Patient presents with  . Follow-up    pt unable to perform PFT today.  pt states she is doing well, daughter states she is not able to go up stairs.     Breathing has been OK.  Not going up staris much, they say she is pretty weak, but not limited by her breathing much. No hospitalizations since August.  No bronchitis episodes that they are aware of.   Weight loss no longer a problem as she has increased her weight by about 10 pounds in the last several months.   She denies any trouble breathing, wheezing, or cough. However, she states that she will choke on food every now and then. Apparently food will "go down the wrong pipe". Otherwise things have been fine. She denies chest pain.    Past Medical History  Diagnosis Date  . Hypertension   . Hyperlipidemia   . Sleep apnea   . Atrial fibrillation with RVR ([place])     a. 06/2014 admitted and failed dccv x 2, amio/eliquis started;  b. CHA2DS2VASc = 6 (eliquis).  . Chronic combined systolic and diastolic CHF (congestive heart failure) ([place])     a. 06/2014 TEE: EF 40-45%, no thrombus, mild MR.  . LBBB (left bundle branch block)   . Anemia   . Diabetes type 2, controlled ([place])   . Tobacco abuse   . Cardiomyopathy ([place])     a. 06/2014 TEE: EF 40-45%, no thrombus, mild MR.  Marland Kitchen COPD (chronic obstructive pulmonary disease) ([place])   . Elevated LFTs   . Pancreas divisum   . Aortic atherosclerosis ([place])   . Internal  hemorrhoids   . Hyperplastic colon polyp       Review of Systems  Constitutional: Negative for fever, chills and fatigue.  HENT: Negative for postnasal drip, rhinorrhea and sinus pressure.   Respiratory: Negative for cough, shortness of breath and wheezing.   Cardiovascular: Negative for chest pain, palpitations and leg swelling.       Objective:   Physical Exam  Filed Vitals:   04/17/15 0935  BP: 116/62  Pulse: 56  Height: 5\' 2"  (1.575 m)  Weight: 110 lb (49.896 kg)  SpO2: 95%   RA  Gen: chronically ill appearing, no acute HENT: OP clear, neck supple PULM: Few crackles in the bases, poor air movement, normal percussion CV: RRR, no mgr, trace edema GI: BS+, soft, nontender Derm: no cyanosis or rash Psyche: normal mood and affect   CT scan of her chest was personally reviewed today (July 2016) and showed moderate centrilobular emphysema as well as nonspecific scarring and bronchiectatic changes in the right lower lobe. CBC    Component Value Date/Time   WBC 9.7 08/08/2014 0812   RBC 4.30 08/08/2014 0812   HGB 14.0 08/08/2014 0812   HCT 41.1 08/08/2014 0812   PLT 323 08/08/2014 0812   MCV 95.6 08/08/2014 0812   MCH 32.6 08/08/2014 0812   MCHC 34.1 08/08/2014 0812  RDW 14.1 08/08/2014 0812   LYMPHSABS 2.3 06/04/2014 0301   MONOABS 0.8 06/04/2014 0301   EOSABS 0.2 06/04/2014 0301   BASOSABS 0.0 06/04/2014 0301         Assessment & Plan:  Centrilobular emphysema ([place]) She has moderate centrilobular emphysema secondary to ongoing tobacco use. She explained to me today that she will never quit smoking and she will "die happy". She is interested in no further workup, medication, or smoking cessation. Because she remains sedentary she has minimal dyspnea at this point. I tried briefly to convince her to quit smoking, but we both laughed because we realized that it was a waste of my time.  Plan: Prevnar and flu shot today Counseled to quit smoking Follow-up when  necessary  Pneumothorax, traumatic She had a traumatic pneumothorax back in the summertime and is of August she still had a small left-sided pneumothorax. Today on exam her lungs are clear and she has symmetric air entry and no symptoms of a pneumothorax. I recommended a chest x-ray and she flatly refused.   Mild dementia I believe that because of her dementia she had difficulty with simple spirometry testing today. She refuses further intervention.  Dysphagia See my discussion below about the abnormality on her CT scan of her chest. She does endorse some degree of dysphagia from time to time. It's difficult to determine the type of food or the situation. However, we had a lengthy conversation about how the pneumonia episode she had back in the summer of 2016 was likely related to aspiration pneumonia considering the fact that she has chronic right lower lobe fibrotic changes. After some lengthy discussion she was willing to see a speech therapist.  Plan: Speech therapy evaluation for dysphasia  Abnormal CT scan I first layer of either the images of her CT chest from July 2016 which shows moderate centrilobular emphysema but then also some fibrotic versus bronchiectatic changes in the right lower lobe. Considering her age and purported diagnosis of dementia this most likely represents aspiration pneumonia.  I explained to them that the differential diagnosis of fibrosis is broad but she is not interested in any surgery further workup part from a dysphagia workup.  Plan: Speech therapy evaluation     Current outpatient prescriptions:  .  acetaminophen (TYLENOL) 500 MG tablet, Take 500 mg by mouth every 6 (six) hours as needed., Disp: , Rfl:  .  alendronate (FOSAMAX) 70 MG tablet, TAKE 1 TABLET BY MOUTH EVERY 7 DAYS. TAKE WITH A FULL GLASS OF WATER ON AN EMPTY STOMACH., Disp: 4 tablet, Rfl: 5 .  amiodarone (PACERONE) 200 MG tablet, TAKE 1 TABLET BY MOUTH DAILY, Disp: 30 tablet, Rfl: 3 .   apixaban (ELIQUIS) 2.5 MG TABS tablet, Take 1 tablet (2.5 mg total) by mouth 2 (two) times daily., Disp: 60 tablet, Rfl: 11 .  atorvastatin (LIPITOR) 40 MG tablet, TAKE 1 TABLET (40 MG TOTAL) BY MOUTH DAILY., Disp: 30 tablet, Rfl: 5 .  Black Cohosh 540 MG CAPS, Take 540 mg by mouth daily., Disp: , Rfl:  .  Calcium Carbonate-Vitamin D (CALTRATE 600+D PO), Take 1 tablet by mouth 2 (two) times daily., Disp: , Rfl:  .  Cholecalciferol (VITAMIN D3) 2000 UNITS TABS, Take 1 tablet by mouth daily.  , Disp: , Rfl:  .  Docusate Sodium (DOC-Q-LACE PO), Take by mouth., Disp: , Rfl:  .  donepezil (ARICEPT) 10 MG tablet, Take 1/2 tablet daily for 2 weeks, then increase to 1 tablet daily, Disp: 30  tablet, Rfl: 11 .  escitalopram (LEXAPRO) 10 MG tablet, Take 1 tablet (10 mg total) by mouth daily., Disp: 30 tablet, Rfl: 5 .  furosemide (LASIX) 40 MG tablet, TAKE 1 TABLET BY MOUTH TWICE DAILY, Disp: 60 tablet, Rfl: 6 .  glucosamine-chondroitin 500-400 MG tablet, Take 1 tablet by mouth 2 (two) times daily., Disp: , Rfl:  .  hydrOXYzine (VISTARIL) 25 MG capsule, 1  Or 2   qday for anxiety, Disp: 30 capsule, Rfl: 1 .  lidocaine (LIDODERM) 5 %, Place 2 patches onto the skin daily. Remove & Discard patch within 12 hours or as directed by MD, Disp: 60 patch, Rfl: 1 .  metoprolol (LOPRESSOR) 50 MG tablet, Take 0.5 tablets (25 mg total) by mouth daily., Disp: , Rfl:  .  polycarbophil (FIBERCON) 625 MG tablet, Take 625 mg by mouth daily.  , Disp: , Rfl:  .  polyethylene glycol (MIRALAX / GLYCOLAX) packet, Take 17 g by mouth daily as needed. , Disp: , Rfl:  .  potassium chloride SA (K-DUR,KLOR-CON) 20 MEQ tablet, TAKE 1 TABLET BY MOUTH DAILY. TAKE AT THE SAME TIME AS FUROSEMIDE, Disp: 30 tablet, Rfl: 6

## 2015-04-18 ENCOUNTER — Other Ambulatory Visit (HOSPITAL_COMMUNITY): Payer: Self-pay | Admitting: Psychiatry

## 2015-04-19 ENCOUNTER — Telehealth: Payer: Self-pay | Admitting: *Deleted

## 2015-04-19 MED ORDER — HYDROXYZINE PAMOATE 25 MG PO CAPS: ORAL_CAPSULE | ORAL | Status: DC

## 2015-04-19 NOTE — Telephone Encounter (Signed)
Received fax from pharmacy requesting refills of hydroxyzine 25mg . Pt has f/u with PCP 05/2015.  Refills sent.

## 2015-04-23 ENCOUNTER — Telehealth: Payer: Self-pay

## 2015-04-23 ENCOUNTER — Encounter: Payer: Self-pay | Admitting: Family

## 2015-04-23 DIAGNOSIS — R1310 Dysphagia, unspecified: Secondary | ICD-10-CM

## 2015-04-23 NOTE — Telephone Encounter (Signed)
Per speech therapy, pt needs modified barium swallow study before they can see her. BQ has ok'ed this to be ordered. lmtcb X1 for pt to make aware of this. Will order MBS after speaking to pt.

## 2015-04-24 ENCOUNTER — Other Ambulatory Visit (HOSPITAL_COMMUNITY): Payer: Self-pay | Admitting: Pulmonary Disease

## 2015-04-24 DIAGNOSIS — R1310 Dysphagia, unspecified: Secondary | ICD-10-CM

## 2015-04-24 DIAGNOSIS — T17320S Food in larynx causing asphyxiation, sequela: Secondary | ICD-10-CM

## 2015-04-24 NOTE — Telephone Encounter (Signed)
lmtcb x1 for pt's daughter. 

## 2015-04-24 NOTE — Telephone Encounter (Signed)
I spoke with the pt's daughter and notified that pt needs MBS before speech therapy will see her  She is okay with this and so order sent to [facility]  Nothing further needed

## 2015-04-24 NOTE — Telephone Encounter (Signed)
[phone], pt daughter cb

## 2015-04-25 ENCOUNTER — Other Ambulatory Visit: Payer: Self-pay | Admitting: Cardiology

## 2015-04-25 NOTE — Telephone Encounter (Signed)
Rx(s) sent to pharmacy electronically.  

## 2015-05-03 ENCOUNTER — Encounter (HOSPITAL_COMMUNITY): Payer: Self-pay

## 2015-05-08 MED FILL — ESCITALOPRAM 10 MG TABLET: 10 | 30 days supply | Qty: 30 | Fill #4

## 2015-05-14 ENCOUNTER — Other Ambulatory Visit (HOSPITAL_COMMUNITY): Payer: Self-pay

## 2015-05-14 ENCOUNTER — Encounter (HOSPITAL_COMMUNITY): Payer: Self-pay

## 2015-05-21 ENCOUNTER — Ambulatory Visit (HOSPITAL_COMMUNITY)
Admission: RE | Admit: 2015-05-21 | Discharge: 2015-05-21 | Disposition: A | Discharge status: Home / Self Care | Payer: PPO | Source: Ambulatory Visit | Attending: Pulmonary Disease | Admitting: Pulmonary Disease

## 2015-05-21 DIAGNOSIS — R1310 Dysphagia, unspecified: Secondary | ICD-10-CM

## 2015-05-21 DIAGNOSIS — Z8701 Personal history of pneumonia (recurrent): Secondary | ICD-10-CM | POA: Insufficient documentation

## 2015-05-21 DIAGNOSIS — R9389 Abnormal findings on diagnostic imaging of other specified body structures: Secondary | ICD-10-CM

## 2015-05-21 DIAGNOSIS — T17320S Food in larynx causing asphyxiation, sequela: Secondary | ICD-10-CM

## 2015-05-22 ENCOUNTER — Other Ambulatory Visit: Payer: Self-pay | Admitting: Family

## 2015-05-22 MED FILL — DONEPEZIL HCL 10 MG TABLET: 10 | 37 days supply | Qty: 30 | Fill #1

## 2015-05-22 MED FILL — HYDROXYZINE PAM 25 MG CAP: 25 | 15 days supply | Qty: 30 | Fill #1

## 2015-05-22 MED FILL — ALENDRONATE NA 70 MG TAB: 70 | 30 days supply | Qty: 4 | Fill #0

## 2015-05-22 MED FILL — POTASSIUM CL ER 20 MEQ TABL: 20 | 30 days supply | Qty: 30 | Fill #6

## 2015-05-22 MED FILL — ATORVASTATIN 40 MG TABLET: 40 | 30 days supply | Qty: 30 | Fill #2

## 2015-05-22 MED FILL — METOPROLOL TARTRATE 50 MG T: 50 | 30 days supply | Qty: 60 | Fill #1

## 2015-05-22 MED FILL — ELIQUIS 2.5 MG TABLET: 2.5 | 30 days supply | Qty: 60 | Fill #10

## 2015-05-22 MED FILL — AMIODARONE HCL 200 MG TAB: 200 | 30 days supply | Qty: 60 | Fill #1

## 2015-05-22 MED FILL — FUROSEMIDE 40 MG TABLET: 40 | 30 days supply | Qty: 60 | Fill #5

## 2015-05-29 ENCOUNTER — Telehealth: Payer: Self-pay | Admitting: Family

## 2015-05-29 ENCOUNTER — Encounter: Payer: Self-pay | Admitting: Family

## 2015-05-29 NOTE — Telephone Encounter (Signed)
Caller name:Susan  Relation to [id number] Call back number: [phone] or cell [phone] Pharmacy:  Reason for call:  Pt's daughter dropped off document for PCP to verify prescription that insurance is not covering and wants to get another prescription that will cover insurance.

## 2015-05-29 NOTE — Telephone Encounter (Signed)
Sarah [name] pt's daughter was called and was informed the below. Pt's daughter understood and will call Customer Service to verify other option for rx to take over the other one.

## 2015-05-29 NOTE — Telephone Encounter (Signed)
Paperwork does not contain names of any formulary alternatives that the provider will need to be to prescribe; please inform caller that they will have to call the Customer Service number on back of Insurance card and get names of formulary alternative medications and call us back and inform us before provider can change medication/SLS  Thanks.

## 2015-05-31 ENCOUNTER — Encounter: Payer: Self-pay | Admitting: Neurology

## 2015-06-04 ENCOUNTER — Ambulatory Visit: Payer: Self-pay

## 2015-06-04 ENCOUNTER — Encounter: Payer: Self-pay | Admitting: Family

## 2015-06-06 MED FILL — ESCITALOPRAM 10 MG TABLET: 10 | 30 days supply | Qty: 30 | Fill #5

## 2015-06-19 ENCOUNTER — Other Ambulatory Visit: Payer: Self-pay | Admitting: Family

## 2015-06-19 ENCOUNTER — Other Ambulatory Visit: Payer: Self-pay | Admitting: Cardiology

## 2015-06-19 ENCOUNTER — Other Ambulatory Visit: Payer: Self-pay | Admitting: Physician Assistant

## 2015-06-19 MED FILL — ALENDRONATE NA 70 MG TAB: 70 | 30 days supply | Qty: 4 | Fill #1

## 2015-06-19 MED FILL — ATORVASTATIN 40 MG TABLET: 40 | 30 days supply | Qty: 30 | Fill #3

## 2015-06-19 MED FILL — FUROSEMIDE 40 MG TABLET: 40 | 30 days supply | Qty: 60 | Fill #6

## 2015-06-20 MED FILL — HYDROXYZINE PAM 25 MG CAP: 25 | 15 days supply | Qty: 30 | Fill #0

## 2015-06-20 NOTE — Telephone Encounter (Signed)
Rx request sent to pharmacy.  

## 2015-06-20 NOTE — Telephone Encounter (Signed)
Please review for refill, Thank you. 

## 2015-06-21 ENCOUNTER — Encounter: Payer: Self-pay | Admitting: Family

## 2015-06-21 ENCOUNTER — Encounter: Payer: Self-pay | Admitting: Neurology

## 2015-06-21 MED ORDER — APIXABAN 2.5 MG PO TABS: 2.5000 mg | ORAL_TABLET | Freq: Two times a day (BID) | ORAL | Status: DC

## 2015-06-21 MED ORDER — POTASSIUM CHLORIDE CRYS ER 20 MEQ PO TBCR: EXTENDED_RELEASE_TABLET | ORAL | Status: DC

## 2015-06-21 MED FILL — ELIQUIS 2.5 MG TABLET: 2.5 | 30 days supply | Qty: 60 | Fill #0

## 2015-06-21 MED FILL — POTASSIUM CL ER 20 MEQ TABL: 20 | 30 days supply | Qty: 30 | Fill #0

## 2015-06-21 NOTE — Telephone Encounter (Signed)
Sarah [name]-- pharmacy is requesting potassium and eliquis.  Please advise.

## 2015-06-21 NOTE — Telephone Encounter (Signed)
Ok to send 1 month supply of eliquis- but ultimately needs to come from Dr. Stanford Breed. OK to send 1 month of potassium as well but pt is overdue for bmet and needs to complete, dx HTN.

## 2015-07-03 ENCOUNTER — Encounter: Payer: Self-pay | Admitting: Cardiology

## 2015-07-03 ENCOUNTER — Ambulatory Visit (INDEPENDENT_AMBULATORY_CARE_PROVIDER_SITE_OTHER): Payer: PPO | Admitting: Cardiology

## 2015-07-03 VITALS — BP 97/59 | HR 62 | Ht 62.0 in | Wt 111.0 lb

## 2015-07-03 DIAGNOSIS — I4891 Unspecified atrial fibrillation: Secondary | ICD-10-CM | POA: Diagnosis not present

## 2015-07-03 DIAGNOSIS — E785 Hyperlipidemia, unspecified: Secondary | ICD-10-CM

## 2015-07-03 DIAGNOSIS — I10 Essential (primary) hypertension: Secondary | ICD-10-CM | POA: Diagnosis not present

## 2015-07-03 DIAGNOSIS — Z720 Tobacco use: Secondary | ICD-10-CM | POA: Diagnosis not present

## 2015-07-03 NOTE — Progress Notes (Signed)
HPI: FU atrial fibrillation. Previous TSH normal. Echocardiogram February 2016 showed an ejection fraction of 40%, mild left ventricular hypertrophy, severe left atrial enlargement, mild mitral regurgitation, moderate right atrial enlargement and mild right ventricular enlargement. Transesophageal echocardiogram February 2016 showed ejection fraction 40-45%, mild mitral regurgitation. Patient underwent cardioversion attempt which was unsuccessful. DCed on amiodarone and metoprolol. At time of follow-up patient had converted to sinus rhythm. Nuclear study 3/16 showed EF 63, diaphragmatic attenuation, no ischemia. Since last seen, She denies dyspnea, chest pain, palpitations or syncope.  Current Outpatient Prescriptions  Medication Sig Dispense Refill  . alendronate (FOSAMAX) 70 MG tablet TAKE 1 TABLET BY MOUTH EVERY 7 DAYS. TAKE WITH A FULL GLASS OF WATER ON AN EMPTY STOMACH. 4 tablet 5  . amiodarone (PACERONE) 200 MG tablet Take 1 tablet (200 mg total) by mouth daily. PLEASE CONTACT OFFICE FOR ADDITIONAL REFILLS 30 tablet 0  . apixaban (ELIQUIS) 2.5 MG TABS tablet Take 1 tablet (2.5 mg total) by mouth 2 (two) times daily. 60 tablet 0  . atorvastatin (LIPITOR) 40 MG tablet TAKE 1 TABLET (40 MG TOTAL) BY MOUTH DAILY. 30 tablet 5  . Black Cohosh 540 MG CAPS Take 540 mg by mouth daily.    . Calcium Carbonate-Vitamin D (CALTRATE 600+D PO) Take 1 tablet by mouth 2 (two) times daily.    . Cholecalciferol (VITAMIN D3) 2000 UNITS TABS Take 1 tablet by mouth daily.      Mariane Baumgarten Sodium (DOC-Q-LACE PO) Take by mouth.    . donepezil (ARICEPT) 10 MG tablet Take 1/2 tablet daily for 2 weeks, then increase to 1 tablet daily 30 tablet 11  . escitalopram (LEXAPRO) 10 MG tablet Take 1 tablet (10 mg total) by mouth daily. 30 tablet 5  . furosemide (LASIX) 40 MG tablet TAKE 1 TABLET BY MOUTH TWICE DAILY 60 tablet 6  . glucosamine-chondroitin 500-400 MG tablet Take 1 tablet by mouth 2 (two) times daily.    .  hydrOXYzine (VISTARIL) 25 MG capsule TAKE 1 OR 2 CAPSULES BY MOUTH DAILY FOR ANXIETY 30 capsule 1  . metoprolol (LOPRESSOR) 50 MG tablet Take 0.5 tablets (25 mg total) by mouth daily.    . polycarbophil (FIBERCON) 625 MG tablet Take 625 mg by mouth daily.      . polyethylene glycol (MIRALAX / GLYCOLAX) packet Take 17 g by mouth daily as needed.     . potassium chloride SA (K-DUR,KLOR-CON) 20 MEQ tablet TAKE 1 TABLET BY MOUTH DAILY. TAKE AT THE SAME TIME AS FUROSEMIDE 30 tablet 0   No current facility-administered medications for this visit.     Past Medical History  Diagnosis Date  . Hypertension   . Hyperlipidemia   . Sleep apnea   . Atrial fibrillation with RVR ([place])     a. 06/2014 admitted and failed dccv x 2, amio/eliquis started;  b. CHA2DS2VASc = 6 (eliquis).  . Chronic combined systolic and diastolic CHF (congestive heart failure) ([place])     a. 06/2014 TEE: EF 40-45%, no thrombus, mild MR.  . LBBB (left bundle branch block)   . Anemia   . Diabetes type 2, controlled ([place])   . Tobacco abuse   . Cardiomyopathy ([place])     a. 06/2014 TEE: EF 40-45%, no thrombus, mild MR.  [place] COPD (chronic obstructive pulmonary disease) ([place])   . Elevated LFTs   . Pancreas divisum   . Aortic atherosclerosis ([place])   . Internal hemorrhoids   . Hyperplastic colon polyp  Past Surgical History  Procedure Laterality Date  . Abdominal hysterectomy  1965  . Hemorroidectomy  1957  . Breast lumpectomy  1966, 1971  . Tee without cardioversion N/A 06/04/2014    Procedure: TRANSESOPHAGEAL ECHOCARDIOGRAM (TEE);  Surgeon: Thayer Headings, MD;  Location: [place];  Service: Cardiovascular;  Laterality: N/A;  . Cardioversion N/A 06/04/2014    Procedure: CARDIOVERSION;  Surgeon: Thayer Headings, MD;  Location[facility]COPY;  Service: Cardiovascular;  Laterality: N/A;    Social History   Social History  . Marital Status: Divorced    Spouse Name: N/A  . Number of Children: 2  . Years of Education: N/A    Occupational History  . retired    Social History Main Topics  . Smoking status: Current Every Day Smoker -- 1.25 packs/day for 50 years    Types: Cigarettes  . Smokeless tobacco: Never Used  . Alcohol Use: No  . Drug Use: No  . Sexual Activity: Not on file   Other Topics Concern  . Not on file   Social History Narrative   Moved from [place], [place] 2009    Family History  Problem Relation Age of Onset  . Diabetes    . Hyperlipidemia Neg Hx   . Heart attack Neg Hx   . Hypertension Neg Hx   . Sudden death Neg Hx   . Colon cancer      grandson  . Diabetes Mother     ROS: no fevers or chills, productive cough, hemoptysis, dysphasia, odynophagia, melena, hematochezia, dysuria, hematuria, rash, seizure activity, orthopnea, PND, pedal edema, claudication. Remaining systems are negative.  Physical Exam: Well-developed frail in no acute distress.  Skin is warm and dry.  HEENT is normal.  Neck is supple.  Chest with diminished BS Cardiovascular exam is regular rate and rhythm.  Abdominal exam nontender or distended. No masses palpated. Extremities show no edema. neuro grossly intact  ECG Sinus rhythm at a rate of 62. Left bundle branch block.

## 2015-07-03 NOTE — Assessment & Plan Note (Signed)
Patient remains in sinus rhythm. Continue amiodarone. Recent TSH normal. Check liver functions and chest x-ray. Continue apixaban. Check hemoglobin and renal function.

## 2015-07-03 NOTE — Patient Instructions (Signed)
Medication Instructions:   NO CHANGE  Labwork:  Your physician recommends that you return for lab work WHEN ABLE  Testing/Procedures:  A chest x-ray takes a picture of the organs and structures inside the chest, including the heart, lungs, and blood vessels. This test can show several things, including, whether the heart is enlarges; whether fluid is building up in the lungs; and whether pacemaker / defibrillator leads are still in place.   Follow-Up:  Your physician wants you to follow-up in: [place] will receive a reminder letter in the mail two months in advance. If you don't receive a letter, please call our office to schedule the follow-up appointment.   If you need a refill on your cardiac medications before your next appointment, please call your pharmacy.

## 2015-07-03 NOTE — Assessment & Plan Note (Signed)
Blood pressure controlled. 

## 2015-07-03 NOTE — Assessment & Plan Note (Signed)
Patient counseled on discontinuing. 

## 2015-07-03 NOTE — Assessment & Plan Note (Signed)
Continue statin. 

## 2015-07-03 NOTE — Assessment & Plan Note (Signed)
LV function improved on most recent nuclear study.Continue present dose of diuretics. Check potassium and renal function.

## 2015-07-04 ENCOUNTER — Encounter: Payer: Self-pay | Admitting: Pulmonary Disease

## 2015-07-04 NOTE — Telephone Encounter (Signed)
BQ - please advise. Thanks. 

## 2015-07-05 ENCOUNTER — Telehealth: Payer: Self-pay | Admitting: Behavioral Health

## 2015-07-05 NOTE — Telephone Encounter (Signed)
Unable to reach patient at time of Pre-Visit Call.  Left message for patient to return call when available.    

## 2015-07-08 ENCOUNTER — Ambulatory Visit (INDEPENDENT_AMBULATORY_CARE_PROVIDER_SITE_OTHER): Payer: PPO | Admitting: Family

## 2015-07-08 ENCOUNTER — Encounter: Payer: Self-pay | Admitting: Family

## 2015-07-08 ENCOUNTER — Other Ambulatory Visit: Payer: Self-pay | Admitting: Family

## 2015-07-08 ENCOUNTER — Ambulatory Visit (HOSPITAL_BASED_OUTPATIENT_CLINIC_OR_DEPARTMENT_OTHER)
Admission: RE | Admit: 2015-07-08 | Discharge: 2015-07-08 | Disposition: A | Discharge status: Home / Self Care | Payer: PPO | Source: Ambulatory Visit | Attending: Cardiology | Admitting: Cardiology

## 2015-07-08 VITALS — BP 118/91 | HR 61 | Temp 98.6°F | Resp 18 | Ht 62.25 in | Wt 111.4 lb

## 2015-07-08 DIAGNOSIS — E785 Hyperlipidemia, unspecified: Secondary | ICD-10-CM

## 2015-07-08 DIAGNOSIS — I10 Essential (primary) hypertension: Secondary | ICD-10-CM

## 2015-07-08 DIAGNOSIS — Z0000 Encounter for general adult medical examination without abnormal findings: Secondary | ICD-10-CM | POA: Diagnosis not present

## 2015-07-08 DIAGNOSIS — J449 Chronic obstructive pulmonary disease, unspecified: Secondary | ICD-10-CM | POA: Diagnosis not present

## 2015-07-08 DIAGNOSIS — I4891 Unspecified atrial fibrillation: Secondary | ICD-10-CM | POA: Diagnosis present

## 2015-07-08 DIAGNOSIS — R918 Other nonspecific abnormal finding of lung field: Secondary | ICD-10-CM | POA: Insufficient documentation

## 2015-07-08 LAB — BASIC METABOLIC PANEL
BUN: 17 mg/dL (ref 6–23)
CO2: 31 meq/L (ref 19–32)
Calcium: 9.7 mg/dL (ref 8.4–10.5)
Chloride: 102 mEq/L (ref 96–112)
Creatinine, Ser: 0.9 mg/dL (ref 0.40–1.20)
GFR: 63.84 mL/min (ref >60.00)
Glucose, Bld: 104 mg/dL — ABNORMAL HIGH (ref 70–99)
POTASSIUM: 4.4 meq/L (ref 3.5–5.1)
SODIUM: 138 meq/L (ref 135–145)

## 2015-07-08 LAB — HEPATIC FUNCTION PANEL
ALBUMIN: 3.9 g/dL (ref 3.5–5.2)
ALT: 31 U/L (ref 0–35)
AST: 28 U/L (ref 0–37)
Alkaline Phosphatase: 132 U/L — ABNORMAL HIGH (ref 39–117)
Bilirubin, Direct: 0.1 mg/dL (ref 0.0–0.3)
TOTAL PROTEIN: 7.3 g/dL (ref 6.0–8.3)
Total Bilirubin: 0.6 mg/dL (ref 0.2–1.2)

## 2015-07-08 LAB — LIPID PANEL
CHOL/HDL RATIO: 3
CHOLESTEROL: 180 mg/dL (ref 0–200)
HDL: 61 mg/dL (ref >39.00)
LDL CALC: 99 mg/dL (ref 0–99)
NonHDL: 119.43
TRIGLYCERIDES: 104 mg/dL (ref 0.0–149.0)
VLDL: 20.8 mg/dL (ref 0.0–40.0)

## 2015-07-08 NOTE — Progress Notes (Signed)
Pre visit review using our clinic review tool, if applicable. No additional management support is needed unless otherwise documented below in the visit note. 

## 2015-07-08 NOTE — Patient Instructions (Signed)
Please complete lab work prior to leaving.  Follow up in 6 months.  

## 2015-07-08 NOTE — Progress Notes (Signed)
Subjective:    Patient ID: Sarah [name], female    DOB: 03-Feb-1932, 82 y.o.   MRN: [id number]  HPI  Subjective:    Sarah [name] is a 82 y.o. female who presents for Medicare Annual/Subsequent preventive examination.  Preventive Screening-Counseling & Management  Tobacco History  Smoking status  . Current Every Day Smoker -- 1.25 packs/day for 50 years  . Types: Cigarettes  Smokeless tobacco  . Never Used     Problems Prior to Visit 1. [place] in the garage. Was bending to life up a 12 pack of [employer].   Hurt her knee (R)   She pressed her life alert.  No head injury.  No residual pain or difficulty walking.   Daughter reports that mood is good.   Dementia- saw Dr. Delice Lesch.  Was placed on Aricept.  Could not tolerate due diarrhea.   Current Problems (verified) Patient Active Problem List   Diagnosis Date Noted  . Dysphagia 04/17/2015  . Abnormal CT scan 04/17/2015  . Mild dementia 04/15/2015  . Head trauma 01/12/2015  . Pneumothorax, traumatic 12/27/2014  . Chronic anticoagulation 12/13/2014  . Abnormal findings on imaging of biliary tract 12/13/2014  . Bradycardia 11/25/2014  . Blepharitis 11/02/2014  . Anxiety 10/22/2014  . Chronic combined systolic and diastolic CHF (congestive heart failure) ([place])   . Hypertension   . Hyperlipidemia   . Tobacco abuse   . Centrilobular emphysema ([place])   . Rectal bleeding 06/25/2014  . CN (constipation) 06/25/2014  . Diabetes type 2, controlled ([place]) 06/19/2014  . Chronic back pain 06/19/2014  . Constipation 06/19/2014  . LBBB (left bundle branch block) 06/05/2014  . Hypokalemia 06/05/2014  . Acute combined systolic and diastolic heart failure ([place]) 06/05/2014  . LV dysfunction 06/05/2014  . Anemia 06/05/2014  . Atrial fibrillation with RVR ([place]) 06/04/2014  . OSA (obstructive sleep apnea) 05/15/2014  . Insomnia 11/07/2013  . Colon cancer screening 09/26/2013  . Borderline diabetes 05/18/2013  .  Hyperkalemia 10/28/2012  . Loss of weight 10/14/2012  . Overactive bladder 06/10/2012  . Left shoulder pain 12/07/2011  . Back pain 01/16/2011  . Microscopic hematuria 01/16/2011  . Hot flashes 01/16/2011  . Tobacco user 09/08/2010  . Hyperlipemia 12/27/2009  . Essential hypertension 12/27/2009  . Osteopenia 12/27/2009  . HOARSENESS, CHRONIC 12/27/2009  . ABDOMINAL BRUIT 12/27/2009    Medications Prior to Visit Current Outpatient Prescriptions on File Prior to Visit  Medication Sig Dispense Refill  . alendronate (FOSAMAX) 70 MG tablet TAKE 1 TABLET BY MOUTH EVERY 7 DAYS. TAKE WITH A FULL GLASS OF WATER ON AN EMPTY STOMACH. 4 tablet 5  . amiodarone (PACERONE) 200 MG tablet Take 1 tablet (200 mg total) by mouth daily. PLEASE CONTACT OFFICE FOR ADDITIONAL REFILLS 30 tablet 0  . apixaban (ELIQUIS) 2.5 MG TABS tablet Take 1 tablet (2.5 mg total) by mouth 2 (two) times daily. 60 tablet 0  . atorvastatin (LIPITOR) 40 MG tablet TAKE 1 TABLET (40 MG TOTAL) BY MOUTH DAILY. 30 tablet 5  . Black Cohosh 540 MG CAPS Take 540 mg by mouth daily.    . Calcium Carbonate-Vitamin D (CALTRATE 600+D PO) Take 1 tablet by mouth 2 (two) times daily.    . Cholecalciferol (VITAMIN D3) 2000 UNITS TABS Take 1 tablet by mouth daily.      Mariane Baumgarten Sodium (DOC-Q-LACE PO) Take by mouth.    . donepezil (ARICEPT) 10 MG tablet Take 1/2 tablet daily for 2 weeks, then  increase to 1 tablet daily 30 tablet 11  . escitalopram (LEXAPRO) 10 MG tablet Take 1 tablet (10 mg total) by mouth daily. 30 tablet 5  . furosemide (LASIX) 40 MG tablet TAKE 1 TABLET BY MOUTH TWICE DAILY 60 tablet 6  . glucosamine-chondroitin 500-400 MG tablet Take 1 tablet by mouth 2 (two) times daily.    . hydrOXYzine (VISTARIL) 25 MG capsule TAKE 1 OR 2 CAPSULES BY MOUTH DAILY FOR ANXIETY 30 capsule 1  . metoprolol (LOPRESSOR) 50 MG tablet Take 0.5 tablets (25 mg total) by mouth daily.    . polycarbophil (FIBERCON) 625 MG tablet Take 625 mg by mouth  daily.      . polyethylene glycol (MIRALAX / GLYCOLAX) packet Take 17 g by mouth daily as needed.     . potassium chloride SA (K-DUR,KLOR-CON) 20 MEQ tablet TAKE 1 TABLET BY MOUTH DAILY. TAKE AT THE SAME TIME AS FUROSEMIDE 30 tablet 0   No current facility-administered medications on file prior to visit.    Current Medications (verified) Current Outpatient Prescriptions  Medication Sig Dispense Refill  . alendronate (FOSAMAX) 70 MG tablet TAKE 1 TABLET BY MOUTH EVERY 7 DAYS. TAKE WITH A FULL GLASS OF WATER ON AN EMPTY STOMACH. 4 tablet 5  . amiodarone (PACERONE) 200 MG tablet Take 1 tablet (200 mg total) by mouth daily. PLEASE CONTACT OFFICE FOR ADDITIONAL REFILLS 30 tablet 0  . apixaban (ELIQUIS) 2.5 MG TABS tablet Take 1 tablet (2.5 mg total) by mouth 2 (two) times daily. 60 tablet 0  . atorvastatin (LIPITOR) 40 MG tablet TAKE 1 TABLET (40 MG TOTAL) BY MOUTH DAILY. 30 tablet 5  . Black Cohosh 540 MG CAPS Take 540 mg by mouth daily.    . Calcium Carbonate-Vitamin D (CALTRATE 600+D PO) Take 1 tablet by mouth 2 (two) times daily.    . Cholecalciferol (VITAMIN D3) 2000 UNITS TABS Take 1 tablet by mouth daily.      Mariane Baumgarten Sodium (DOC-Q-LACE PO) Take by mouth.    . donepezil (ARICEPT) 10 MG tablet Take 1/2 tablet daily for 2 weeks, then increase to 1 tablet daily 30 tablet 11  . escitalopram (LEXAPRO) 10 MG tablet Take 1 tablet (10 mg total) by mouth daily. 30 tablet 5  . furosemide (LASIX) 40 MG tablet TAKE 1 TABLET BY MOUTH TWICE DAILY 60 tablet 6  . glucosamine-chondroitin 500-400 MG tablet Take 1 tablet by mouth 2 (two) times daily.    . hydrOXYzine (VISTARIL) 25 MG capsule TAKE 1 OR 2 CAPSULES BY MOUTH DAILY FOR ANXIETY 30 capsule 1  . lidocaine (LIDODERM) 5 % Place 1 patch onto the skin as needed. Remove & Discard patch within 12 hours or as directed by MD    . metoprolol (LOPRESSOR) 50 MG tablet Take 0.5 tablets (25 mg total) by mouth daily.    . polycarbophil (FIBERCON) 625 MG tablet  Take 625 mg by mouth daily.      . polyethylene glycol (MIRALAX / GLYCOLAX) packet Take 17 g by mouth daily as needed.     . potassium chloride SA (K-DUR,KLOR-CON) 20 MEQ tablet TAKE 1 TABLET BY MOUTH DAILY. TAKE AT THE SAME TIME AS FUROSEMIDE 30 tablet 0   No current facility-administered medications for this visit.     Allergies (verified) Morphine and related   PAST HISTORY  Family History Family History  Problem Relation Age of Onset  . Diabetes    . Hyperlipidemia Neg Hx   . Heart attack Neg Hx   .  Hypertension Neg Hx   . Sudden death Neg Hx   . Colon cancer      grandson  . Diabetes Mother     Social History Social History  Substance Use Topics  . Smoking status: Current Every Day Smoker -- 1.25 packs/day for 50 years    Types: Cigarettes  . Smokeless tobacco: Never Used  . Alcohol Use: No    Patient presents today for complete physical.  Immunizations: up to date Diet: fair- per daughter Exercise: no formal exercise Colonoscopy: up to date.   Dexa: up to date- on fosamax Pap Smear: hysterectomy Mammogram: declines   Are there smokers in your home (other than you)? Yes  Risk Factors Current exercise habits: no formal exercise  Dietary issues discussed: Has a good appetite. Wt Readings from Last 3 Encounters:  07/08/15 111 lb 6.4 oz (50.531 kg)  07/03/15 111 lb (50.349 kg)  04/17/15 110 lb (49.896 kg)    Cardiac risk factors: advanced age (older than 51 for men, 67 for women), sedentary lifestyle and smoking/ tobacco exposure.  Depression Screen (Note: if answer to either of the following is "Yes", a more complete depression screening is indicated)   Over the past two weeks, have you felt down, depressed or hopeless? No  Over the past two weeks, have you felt little interest or pleasure in doing things? No  Have you lost interest or pleasure in daily life? Yes  Do you often feel hopeless? No  Do you cry easily over simple problems? No  Activities  of Daily Living In your present state of health, do you have any difficulty performing the following activities?:  Driving? yes Managing money?  yes Feeding yourself? No Getting from bed to chair? No . Climbing a flight of stairs? Yes Preparing food and eating?: No Bathing or showering? Yes needs some assistance Getting dressed: No Getting to the toilet? No Using the toilet:No Moving around from place to place: No In the past year have you fallen or had a near fall?:Yes   Are you sexually active?  No  Do you have more than one partner?  No  Hearing Difficulties: Yes Do you often ask people to speak up or repeat themselves? Yes Do you experience ringing or noises in your ears? No Do you have difficulty understanding soft or whispered voices? Yes   Do you feel that you have a problem with memory? Yes  Do you often misplace items? No  Do you feel safe at home?  Yes  Cognitive Testing  Alert? Yes  Normal Appearance?Yes  Oriented to person? Yes  Place? Yes   Time? Yes  Recall of three objects?  No  Can perform simple calculations? Yes  Displays appropriate judgment?Yes  Can read the correct time from a watch face?Yes   Advanced Directives have been discussed with the patient? Yes  List the Names of Other Physician/Practitioners you currently use: 1.  See care teams  Indicate any recent Medical Services you may have received from other than [facility] providers in the past year (date may be approximate).  Immunization History  Administered Date(s) Administered  . Influenza Split 01/16/2011, 03/04/2012  . Influenza Whole 12/27/2009  . Influenza,inj,Quad PF,36+ Mos 01/31/2013, 02/12/2014, 04/17/2015  . Pneumococcal Conjugate-13 01/31/2013, 04/17/2015  . Pneumococcal Polysaccharide-23 12/27/2009  . Td 05/15/2014  . Zoster 05/04/2008    Screening Tests Health Maintenance  Topic Date Due  . FOOT EXAM  04/22/1942  . OPHTHALMOLOGY EXAM  04/22/1942  . URINE MICROALBUMIN  04/22/1942  . HEMOGLOBIN A1C  04/23/2015  . INFLUENZA VACCINE  12/03/2015  . TETANUS/TDAP  05/15/2024  . DEXA SCAN  Completed  . ZOSTAVAX  Completed  . PNA vac Low Risk Adult  Completed    All answers were reviewed with the patient and necessary referrals were made:  O'SULLIVAN,Taje Littler S., NP   07/08/2015   History reviewed: allergies, current medications, past family history, past medical history, past social history, past surgical history and problem list  Review of Systems See HPI   Objective:     Body mass index is 20.22 kg/(m^2). Ht 5' 2.25" (1.581 m)  Wt 111 lb 6.4 oz (50.531 kg)  BMI 20.22 kg/m2  Physical Exam  Constitutional: She is oriented to person, place, and time. She appears well-developed and well-nourished. No distress.  HENT:  Head: Normocephalic and atraumatic.  Right Ear: Tympanic membrane and ear canal normal.  Left Ear: Tympanic membrane and ear canal normal.  Mouth/Throat: Oropharynx is clear and moist.  Eyes: Pupils are equal, round, and reactive to light. No scleral icterus.  Neck: Normal range of motion. No thyromegaly present.  Cardiovascular: Normal rate and regular rhythm.   No murmur heard. Pulmonary/Chest: Effort normal and breath sounds normal. No respiratory distress. He has no wheezes. She has no rales. She exhibits no tenderness.  Abdominal: Soft. Bowel sounds are normal. He exhibits no distension and no mass. There is no tenderness. There is no rebound and no guarding.  Musculoskeletal: She exhibits no edema.  Lymphadenopathy:    She has no cervical adenopathy.  Neurological: She is alert, but memory appears impaired.  She exhibits normal muscle tone. Coordination normal.  Skin: Skin is warm and dry.  Psychiatric: She has a normal mood and affect. Her behavior is normal.  Breast/pelvic: deferred        Assessment & Plan:         Assessment:          Plan:     During the course of the visit the patient was educated and  counseled about appropriate screening and preventive services including:    Smoking cessation counseling  Advanced directives: no advanced directives .  Advised pt's daughter to contact an attorney to discuss guardianship as patient is not competent to sign advanced directives in my opinion.   Diet review for nutrition referral? Yes ____  Not Indicated __x__  I did notify her cardiologist about her most recent fall due to her being on Eliquis.  Patient Instructions (the written plan) was given to the patient.  Medicare Attestation I have personally reviewed: The patient's medical and social history Their use of alcohol, tobacco or illicit drugs Their current medications and supplements The patient's functional ability including ADLs,fall risks, home safety risks, cognitive, and hearing and visual impairment Diet and physical activities Evidence for depression or mood disorders  The patient's weight, height, BMI, and visual acuity have been recorded in the chart.  I have made referrals, counseling, and provided education to the patient based on review of the above and I have provided the patient with a written personalized care plan for preventive services.     O'SULLIVAN,Bristol Osentoski S., NP   07/08/2015          Review of Systems     Objective:   Physical Exam        Assessment & Plan:

## 2015-07-09 ENCOUNTER — Other Ambulatory Visit (INDEPENDENT_AMBULATORY_CARE_PROVIDER_SITE_OTHER): Payer: PPO

## 2015-07-09 ENCOUNTER — Encounter: Payer: Self-pay | Admitting: Family

## 2015-07-09 ENCOUNTER — Encounter: Payer: Self-pay | Admitting: Cardiology

## 2015-07-09 DIAGNOSIS — M8580 Other specified disorders of bone density and structure, unspecified site: Secondary | ICD-10-CM | POA: Diagnosis not present

## 2015-07-09 LAB — VITAMIN D 25 HYDROXY (VIT D DEFICIENCY, FRACTURES): VITD: 53.08 ng/mL (ref 30.00–100.00)

## 2015-07-09 MED FILL — ESCITALOPRAM 10 MG TABLET: 10 | 30 days supply | Qty: 30 | Fill #0

## 2015-07-17 ENCOUNTER — Other Ambulatory Visit: Payer: Self-pay | Admitting: Cardiology

## 2015-07-17 ENCOUNTER — Other Ambulatory Visit: Payer: Self-pay | Admitting: Physician Assistant

## 2015-07-17 ENCOUNTER — Other Ambulatory Visit: Payer: Self-pay | Admitting: Family

## 2015-07-17 MED FILL — AMIODARONE HCL 200 MG TAB: 200 | 30 days supply | Qty: 60 | Fill #0

## 2015-07-17 MED FILL — HYDROXYZINE PAM 25 MG CAP: 25 | 30 days supply | Qty: 30 | Fill #1

## 2015-07-17 MED FILL — ATORVASTATIN 40 MG TABLET: 40 | 30 days supply | Qty: 30 | Fill #4

## 2015-07-17 MED FILL — FUROSEMIDE 40 MG TABLET: 40 | 30 days supply | Qty: 60 | Fill #0

## 2015-07-17 MED FILL — ALENDRONATE NA 70 MG TAB: 70 | 30 days supply | Qty: 4 | Fill #2

## 2015-07-17 NOTE — Telephone Encounter (Signed)
REFILL 

## 2015-07-17 NOTE — Telephone Encounter (Signed)
Please review for refill. Thanks!  

## 2015-07-18 MED FILL — POTASSIUM CL ER 20 MEQ TABL: 20 | 30 days supply | Qty: 30 | Fill #0

## 2015-07-18 NOTE — Telephone Encounter (Signed)
Medication filled to pharmacy as requested.   

## 2015-07-30 NOTE — Progress Notes (Signed)
HPI: FU atrial fibrillation. Previous TSH normal. Echocardiogram February 2016 showed an ejection fraction of 40%, mild left ventricular hypertrophy, severe left atrial enlargement, mild mitral regurgitation, moderate right atrial enlargement and mild right ventricular enlargement. Transesophageal echocardiogram February 2016 showed ejection fraction 40-45%, mild mitral regurgitation. Patient underwent cardioversion attempt which was unsuccessful. DCed on amiodarone and metoprolol. At time of follow-up patient had converted to sinus rhythm. Nuclear study 3/16 showed EF 63, diaphragmatic attenuation, no ischemia. Since last seen,   Current Outpatient Prescriptions  Medication Sig Dispense Refill  . alendronate (FOSAMAX) 70 MG tablet TAKE 1 TABLET BY MOUTH EVERY 7 DAYS. TAKE WITH A FULL GLASS OF WATER ON AN EMPTY STOMACH. 4 tablet 5  . amiodarone (PACERONE) 200 MG tablet TAKE TWO TABLETS BY MOUTH DAILY 60 tablet 5  . apixaban (ELIQUIS) 2.5 MG TABS tablet Take 1 tablet (2.5 mg total) by mouth 2 (two) times daily. 60 tablet 0  . atorvastatin (LIPITOR) 40 MG tablet TAKE 1 TABLET (40 MG TOTAL) BY MOUTH DAILY. 30 tablet 5  . Black Cohosh 540 MG CAPS Take 540 mg by mouth daily.    . Calcium Carbonate-Vitamin D (CALTRATE 600+D PO) Take 1 tablet by mouth 2 (two) times daily.    . Cholecalciferol (VITAMIN D3) 2000 UNITS TABS Take 1 tablet by mouth daily.      Mariane Baumgarten Sodium (DOC-Q-LACE PO) Take by mouth.    . donepezil (ARICEPT) 10 MG tablet Take 1/2 tablet daily for 2 weeks, then increase to 1 tablet daily 30 tablet 11  . escitalopram (LEXAPRO) 10 MG tablet Take 1 tablet (10 mg total) by mouth daily. 30 tablet 5  . furosemide (LASIX) 40 MG tablet TAKE 1 TABLET BY MOUTH TWICE DAILY 60 tablet 6  . glucosamine-chondroitin 500-400 MG tablet Take 1 tablet by mouth 2 (two) times daily.    . hydrOXYzine (VISTARIL) 25 MG capsule TAKE 1 OR 2 CAPSULES BY MOUTH DAILY FOR ANXIETY 30 capsule 1  . lidocaine  (LIDODERM) 5 % Place 1 patch onto the skin as needed. Remove & Discard patch within 12 hours or as directed by MD    . metoprolol (LOPRESSOR) 50 MG tablet Take 0.5 tablets (25 mg total) by mouth daily.    . polycarbophil (FIBERCON) 625 MG tablet Take 625 mg by mouth daily.      . polyethylene glycol (MIRALAX / GLYCOLAX) packet Take 17 g by mouth daily as needed.     . potassium chloride SA (K-DUR,KLOR-CON) 20 MEQ tablet TAKE 1 TABLET BY MOUTH DAILY. TAKE AT THE SAME TIME AS FUROSEMIDE 30 tablet 0   No current facility-administered medications for this visit.     Past Medical History  Diagnosis Date  . Hypertension   . Hyperlipidemia   . Sleep apnea   . Atrial fibrillation with RVR ([place])     a. 06/2014 admitted and failed dccv x 2, amio/eliquis started;  b. CHA2DS2VASc = 6 (eliquis).  . Chronic combined systolic and diastolic CHF (congestive heart failure) ([place])     a. 06/2014 TEE: EF 40-45%, no thrombus, mild MR.  . LBBB (left bundle branch block)   . Anemia   . Diabetes type 2, controlled ([place])   . Tobacco abuse   . Cardiomyopathy ([place])     a. 06/2014 TEE: EF 40-45%, no thrombus, mild MR.  Marland Kitchen COPD (chronic obstructive pulmonary disease) ([place])   . Elevated LFTs   . Pancreas divisum   . Aortic atherosclerosis ([place])   .  Internal hemorrhoids   . Hyperplastic colon polyp     Past Surgical History  Procedure Laterality Date  . Abdominal hysterectomy  1965  . Hemorroidectomy  1957  . Breast lumpectomy  1966, 1971  . Tee without cardioversion N/A 06/04/2014    Procedure: TRANSESOPHAGEAL ECHOCARDIOGRAM (TEE);  Surgeon: Thayer Headings, MD;  Location: [facility];  Service: Cardiovascular;  Laterality: N/A;  . Cardioversion N/A 06/04/2014    Procedure: CARDIOVERSION;  Surgeon: Thayer Headings, MD;  Location: [facility] ENDOSCOPY;  Service: Cardiovascular;  Laterality: N/A;    Social History   Social History  . Marital Status: Divorced    Spouse Name: N/A  . Number of Children: 2  . Years of  Education: N/A   Occupational History  . retired    Social History Main Topics  . Smoking status: Current Every Day Smoker -- 1.25 packs/day for 50 years    Types: Cigarettes  . Smokeless tobacco: Never Used  . Alcohol Use: No  . Drug Use: No  . Sexual Activity: Not on file   Other Topics Concern  . Not on file   Social History Narrative   Moved from [place], [place] 2009    Family History  Problem Relation Age of Onset  . Diabetes    . Hyperlipidemia Neg Hx   . Heart attack Neg Hx   . Hypertension Neg Hx   . Sudden death Neg Hx   . Colon cancer      grandson  . Diabetes Mother     ROS: no fevers or chills, productive cough, hemoptysis, dysphasia, odynophagia, melena, hematochezia, dysuria, hematuria, rash, seizure activity, orthopnea, PND, pedal edema, claudication. Remaining systems are negative.  Physical Exam: Well-developed well-nourished in no acute distress.  Skin is warm and dry.  HEENT is normal.  Neck is supple.  Chest is clear to auscultation with normal expansion.  Cardiovascular exam is regular rate and rhythm.  Abdominal exam nontender or distended. No masses palpated. Extremities show no edema. neuro grossly intact  ECG     This encounter was created in error - please disregard.

## 2015-07-31 ENCOUNTER — Encounter: Payer: Self-pay | Admitting: Cardiology

## 2015-07-31 NOTE — Progress Notes (Unsigned)
Patient scheduled for appointment today. However her daughter came to the office and stated the patient refused to come. Her dementia is apparently worsening. She has fallen 4 times in the past 3 weeks. We have therefore made the decision that anticoagulation will be discontinued. I feel the risk outweighs the benefit and the daughter is in agreement. She understands the higher risk of CVA off of anticoagulation but at this point there is no other choice. We will see her back as previously scheduled. Kirk Ruths

## 2015-08-08 MED FILL — ESCITALOPRAM 10 MG TABLET: 10 | 30 days supply | Qty: 30 | Fill #1

## 2015-08-19 ENCOUNTER — Telehealth: Payer: Self-pay | Admitting: Family

## 2015-08-19 ENCOUNTER — Encounter (HOSPITAL_BASED_OUTPATIENT_CLINIC_OR_DEPARTMENT_OTHER): Payer: Self-pay

## 2015-08-19 ENCOUNTER — Ambulatory Visit: Payer: Self-pay | Admitting: Family

## 2015-08-19 ENCOUNTER — Inpatient Hospital Stay (HOSPITAL_BASED_OUTPATIENT_CLINIC_OR_DEPARTMENT_OTHER)
Admission: EM | Admit: 2015-08-19 | Discharge: 2015-08-24 | DRG: 190 | Disposition: A | Discharge status: Hospice | Payer: PPO | Attending: Internal Medicine | Admitting: Internal Medicine

## 2015-08-19 ENCOUNTER — Emergency Department (HOSPITAL_BASED_OUTPATIENT_CLINIC_OR_DEPARTMENT_OTHER): Payer: PPO

## 2015-08-19 DIAGNOSIS — Z885 Allergy status to narcotic agent status: Secondary | ICD-10-CM

## 2015-08-19 DIAGNOSIS — I429 Cardiomyopathy, unspecified: Secondary | ICD-10-CM | POA: Diagnosis present

## 2015-08-19 DIAGNOSIS — I482 Chronic atrial fibrillation: Secondary | ICD-10-CM | POA: Diagnosis present

## 2015-08-19 DIAGNOSIS — J9601 Acute respiratory failure with hypoxia: Secondary | ICD-10-CM

## 2015-08-19 DIAGNOSIS — Z720 Tobacco use: Secondary | ICD-10-CM | POA: Diagnosis not present

## 2015-08-19 DIAGNOSIS — Z9119 Patient's noncompliance with other medical treatment and regimen: Secondary | ICD-10-CM | POA: Diagnosis not present

## 2015-08-19 DIAGNOSIS — I110 Hypertensive heart disease with heart failure: Secondary | ICD-10-CM | POA: Diagnosis present

## 2015-08-19 DIAGNOSIS — I248 Other forms of acute ischemic heart disease: Secondary | ICD-10-CM | POA: Diagnosis present

## 2015-08-19 DIAGNOSIS — J9621 Acute and chronic respiratory failure with hypoxia: Secondary | ICD-10-CM | POA: Diagnosis present

## 2015-08-19 DIAGNOSIS — J441 Chronic obstructive pulmonary disease with (acute) exacerbation: Secondary | ICD-10-CM | POA: Diagnosis present

## 2015-08-19 DIAGNOSIS — F329 Major depressive disorder, single episode, unspecified: Secondary | ICD-10-CM | POA: Diagnosis present

## 2015-08-19 DIAGNOSIS — Z79899 Other long term (current) drug therapy: Secondary | ICD-10-CM | POA: Diagnosis not present

## 2015-08-19 DIAGNOSIS — F0390 Unspecified dementia without behavioral disturbance: Secondary | ICD-10-CM | POA: Diagnosis present

## 2015-08-19 DIAGNOSIS — I700 Atherosclerosis of aorta: Secondary | ICD-10-CM | POA: Diagnosis present

## 2015-08-19 DIAGNOSIS — I4891 Unspecified atrial fibrillation: Secondary | ICD-10-CM | POA: Diagnosis present

## 2015-08-19 DIAGNOSIS — R7989 Other specified abnormal findings of blood chemistry: Secondary | ICD-10-CM | POA: Diagnosis present

## 2015-08-19 DIAGNOSIS — I5042 Chronic combined systolic (congestive) and diastolic (congestive) heart failure: Secondary | ICD-10-CM | POA: Diagnosis present

## 2015-08-19 DIAGNOSIS — E1169 Type 2 diabetes mellitus with other specified complication: Secondary | ICD-10-CM | POA: Diagnosis not present

## 2015-08-19 DIAGNOSIS — G4733 Obstructive sleep apnea (adult) (pediatric): Secondary | ICD-10-CM | POA: Diagnosis present

## 2015-08-19 DIAGNOSIS — E785 Hyperlipidemia, unspecified: Secondary | ICD-10-CM | POA: Diagnosis present

## 2015-08-19 DIAGNOSIS — Z833 Family history of diabetes mellitus: Secondary | ICD-10-CM | POA: Diagnosis not present

## 2015-08-19 DIAGNOSIS — I2781 Cor pulmonale (chronic): Secondary | ICD-10-CM | POA: Diagnosis present

## 2015-08-19 DIAGNOSIS — E119 Type 2 diabetes mellitus without complications: Secondary | ICD-10-CM | POA: Diagnosis present

## 2015-08-19 DIAGNOSIS — Z66 Do not resuscitate: Secondary | ICD-10-CM | POA: Diagnosis present

## 2015-08-19 DIAGNOSIS — Z7982 Long term (current) use of aspirin: Secondary | ICD-10-CM

## 2015-08-19 DIAGNOSIS — F17210 Nicotine dependence, cigarettes, uncomplicated: Secondary | ICD-10-CM | POA: Diagnosis present

## 2015-08-19 DIAGNOSIS — I447 Left bundle-branch block, unspecified: Secondary | ICD-10-CM | POA: Diagnosis present

## 2015-08-19 DIAGNOSIS — Z7901 Long term (current) use of anticoagulants: Secondary | ICD-10-CM

## 2015-08-19 DIAGNOSIS — I10 Essential (primary) hypertension: Secondary | ICD-10-CM | POA: Diagnosis present

## 2015-08-19 DIAGNOSIS — R778 Other specified abnormalities of plasma proteins: Secondary | ICD-10-CM | POA: Diagnosis present

## 2015-08-19 DIAGNOSIS — R079 Chest pain, unspecified: Secondary | ICD-10-CM | POA: Diagnosis not present

## 2015-08-19 DIAGNOSIS — F419 Anxiety disorder, unspecified: Secondary | ICD-10-CM | POA: Diagnosis present

## 2015-08-19 LAB — BASIC METABOLIC PANEL WITH GFR
Anion gap: 11 (ref 5–15)
BUN: 22 mg/dL — ABNORMAL HIGH (ref 6–20)
CO2: 24 mmol/L (ref 22–32)
Calcium: 9.1 mg/dL (ref 8.9–10.3)
Chloride: 105 mmol/L (ref 101–111)
Creatinine, Ser: 1.16 mg/dL — ABNORMAL HIGH (ref 0.44–1.00)
GFR calc Af Amer: 50 mL/min — ABNORMAL LOW
GFR calc non Af Amer: 43 mL/min — ABNORMAL LOW
Glucose, Bld: 246 mg/dL — ABNORMAL HIGH (ref 65–99)
Potassium: 4.1 mmol/L (ref 3.5–5.1)
Sodium: 140 mmol/L (ref 135–145)

## 2015-08-19 LAB — I-STAT ARTERIAL BLOOD GAS, ED
ACID-BASE DEFICIT: 2 mmol/L (ref 0.0–2.0)
Bicarbonate: 24.9 mEq/L — ABNORMAL HIGH (ref 20.0–24.0)
O2 SAT: 95 %
PCO2 ART: 48.3 mmHg — AB (ref 35.0–45.0)
Patient temperature: 98.1
TCO2: 26 mmol/L (ref 0–100)
pH, Arterial: 7.318 — ABNORMAL LOW (ref 7.350–7.450)
pO2, Arterial: 81 mmHg (ref 80.0–100.0)

## 2015-08-19 LAB — CBC
HCT: 40.2 % (ref 36.0–46.0)
Hemoglobin: 13.1 g/dL (ref 12.0–15.0)
MCH: 32.4 pg (ref 26.0–34.0)
MCHC: 32.6 g/dL (ref 30.0–36.0)
MCV: 99.5 fL (ref 78.0–100.0)
Platelets: 272 10*3/uL (ref 150–400)
RBC: 4.04 MIL/uL (ref 3.87–5.11)
RDW: 15.7 % — ABNORMAL HIGH (ref 11.5–15.5)
WBC: 9.5 10*3/uL (ref 4.0–10.5)

## 2015-08-19 LAB — BRAIN NATRIURETIC PEPTIDE: B Natriuretic Peptide: 488.3 pg/mL — ABNORMAL HIGH (ref 0.0–100.0)

## 2015-08-19 LAB — TROPONIN I: Troponin I: 0.06 ng/mL — ABNORMAL HIGH

## 2015-08-19 MED ORDER — ALBUTEROL SULFATE (2.5 MG/3ML) 0.083% IN NEBU
2.5000 mg | INHALATION_SOLUTION | Freq: Once | RESPIRATORY_TRACT | Status: AC
  Administered 2015-08-19: 2.5 mg via RESPIRATORY_TRACT

## 2015-08-19 MED ORDER — IPRATROPIUM-ALBUTEROL 0.5-2.5 (3) MG/3ML IN SOLN
3.0000 mL | Freq: Once | RESPIRATORY_TRACT | Status: AC
  Administered 2015-08-19: 3 mL via RESPIRATORY_TRACT

## 2015-08-19 MED ORDER — ALBUTEROL (5 MG/ML) CONTINUOUS INHALATION SOLN
10.0000 mg/h | INHALATION_SOLUTION | RESPIRATORY_TRACT | Status: AC
  Administered 2015-08-19: 10 mg/h via RESPIRATORY_TRACT
  Filled 2015-08-19: qty 20

## 2015-08-19 MED ORDER — LEVOFLOXACIN IN D5W 750 MG/150ML IV SOLN
750.0000 mg | INTRAVENOUS | Status: DC
  Administered 2015-08-19: 750 mg via INTRAVENOUS
  Filled 2015-08-19: qty 150

## 2015-08-19 MED ORDER — METHYLPREDNISOLONE SODIUM SUCC 125 MG IJ SOLR
INTRAMUSCULAR | Status: AC
  Filled 2015-08-19: qty 2

## 2015-08-19 MED ORDER — METHYLPREDNISOLONE SODIUM SUCC 125 MG IJ SOLR
125.0000 mg | Freq: Once | INTRAMUSCULAR | Status: AC
  Administered 2015-08-19: 125 mg via INTRAVENOUS

## 2015-08-19 NOTE — ED Notes (Signed)
Pt here for sob, cough.

## 2015-08-19 NOTE — Telephone Encounter (Signed)
Caller name:Susan Relationship to patient:daughter Can be reached:[phone] Pharmacy:  Reason for call:She is concerned and would like some information on dementia.  The patient is doing less and less with personal hygiene.  She has a weird breathing affect going on. Daughter would like some guidance.  She will not feed her cats.  She said this monring she wanted and ambulance for her breathing and then said but I am not going with them

## 2015-08-19 NOTE — Telephone Encounter (Signed)
Spoke with pt's daughter. States that pt "makes a weird noise" when she breathes after getting up and walking; almost a grunting noise.  States she has been making the noise for some time. Pt told her she was having trouble breathing when she got up this morning and told her to call 911 but then said she wasn't going to go anywhere and went back to bed. States pt is having increased agitation and they would like more information about what to expect with different stages of dementia. Offered 5:15 appt today but they are unable to come in at that time. She schedule appt for tomorrow at 7:45am and was advised that if pt breathing worsens to have earlier evaluation and she voices understanding.

## 2015-08-19 NOTE — Plan of Care (Signed)
82 year old female with history of COPD continues to smoke presents with significant COPD exacerbation requiring continuous nebulizer treatment in chronic respiratory failure with likely chronic hypoxia, Accepted to step down bed given increased work of breathing asked to obtain ABG Spoke to Dr. Orie Fisherman 10:55 PM

## 2015-08-19 NOTE — ED Notes (Signed)
MD at bedside. 

## 2015-08-19 NOTE — Telephone Encounter (Signed)
Noted and agree. 

## 2015-08-19 NOTE — ED Provider Notes (Signed)
CSN: [id number]     Arrival date & time 08/19/15  2054 History  By signing my name below, I, Sarah [name], attest that this documentation has been prepared under the direction and in the presence of Sarah Krystal Eaton, MD. Electronically Signed: Doran [name], ED Scribe. 08/22/2015. 7:27 PM.   Chief Complaint  Patient presents with  . Shortness of Breath   Level 5 Caveat due to the condition of the patient  The history is provided by a relative. The history is limited by the condition of the patient. No language interpreter was used.   HPI Comments: A LEVEL 5 CAVEAT PERTAINS DUE TO URGENT NEED FOR INTERVENTION, DEMENTIA Sarah [name] is a 82 y.o. female who presents to the Emergency Department with a PMHx of HTN, DM, atrial fibrillation, and COPD complaining of SOB with an associated cough, PTA. Daughter states she has been hearing a change in the patient breath sounds for the past several weeks. She has been trying to get the pt to see a Pulmonologist for quite sometime and finally made an appointment to see them tomorrow. However, tonight, the pts breathing worsened so the daughter brought the pt to the ED for evaluation.   Daughter states the pt was recently diagnosed with COPD however, she does not use any breathing treatments, inhaler or oxygen at home.   Pt is allergic to Morphine Pt smokes and does not want to quit.  Past Medical History  Diagnosis Date  . Hypertension   . Hyperlipidemia   . Sleep apnea   . Atrial fibrillation with RVR ([place])     a. 06/2014 admitted and failed dccv x 2, amio/eliquis started;  b. CHA2DS2VASc = 6 (eliquis).  . Chronic combined systolic and diastolic CHF (congestive heart failure) ([place])     a. 06/2014 TEE: EF 40-45%, no thrombus, mild MR.  . LBBB (left bundle branch block)   . Anemia   . Diabetes type 2, controlled ([place])   . Tobacco abuse   . Cardiomyopathy ([place])     a. 06/2014 TEE: EF 40-45%, no thrombus, mild MR.  Marland Kitchen COPD (chronic obstructive  pulmonary disease) ([place])   . Elevated LFTs   . Pancreas divisum   . Aortic atherosclerosis ([place])   . Internal hemorrhoids   . Hyperplastic colon polyp    Past Surgical History  Procedure Laterality Date  . Abdominal hysterectomy  1965  . Hemorroidectomy  1957  . Breast lumpectomy  1966, 1971  . Tee without cardioversion N/A 06/04/2014    Procedure: TRANSESOPHAGEAL ECHOCARDIOGRAM (TEE);  Surgeon: Thayer Headings, MD;  Location: [facility];  Service: Cardiovascular;  Laterality: N/A;  . Cardioversion N/A 06/04/2014    Procedure: CARDIOVERSION;  Surgeon: Thayer Headings, MD;  Location: [facility] ENDOSCOPY;  Service: Cardiovascular;  Laterality: N/A;   Family History  Problem Relation Age of Onset  . Diabetes    . Hyperlipidemia Neg Hx   . Heart attack Neg Hx   . Hypertension Neg Hx   . Sudden death Neg Hx   . Colon cancer      grandson  . Diabetes Mother    Social History  Substance Use Topics  . Smoking status: Current Every Day Smoker -- 1.25 packs/day for 50 years    Types: Cigarettes  . Smokeless tobacco: Never Used  . Alcohol Use: No   OB History    No data available     Review of Systems  Unable to perform ROS: Severe respiratory distress  Respiratory:  Positive for cough and shortness of breath.   UNABLE TO PERFORM ROS DUE TO LEVEL 5 CAVEAT Allergies  Morphine and related  Home Medications   Prior to Admission medications   Medication Sig Start Date End Date Taking? Authorizing Provider  alendronate (FOSAMAX) 70 MG tablet TAKE 1 TABLET BY MOUTH EVERY 7 DAYS. TAKE WITH A FULL GLASS OF WATER ON AN EMPTY STOMACH. 05/22/15  Yes Debbrah Alar, NP  amiodarone (PACERONE) 200 MG tablet TAKE TWO TABLETS BY MOUTH DAILY 07/17/15  Yes Almyra Deforest, PA  aspirin EC 81 MG tablet Take 81 mg by mouth daily.   Yes Historical Provider, MD  atorvastatin (LIPITOR) 40 MG tablet TAKE 1 TABLET (40 MG TOTAL) BY MOUTH DAILY. 04/02/15  Yes Debbrah Alar, NP  Black Cohosh 540 MG CAPS Take 540  mg by mouth daily.   Yes Historical Provider, MD  Calcium Carbonate-Vitamin D (CALTRATE 600+D PO) Take 1 tablet by mouth 2 (two) times daily.   Yes Historical Provider, MD  Cholecalciferol (VITAMIN D3) 2000 UNITS TABS Take 1 tablet by mouth daily.     Yes Historical Provider, MD  Docusate Sodium (DOC-Q-LACE PO) Take 1 tablet by mouth daily.    Yes Historical Provider, MD  escitalopram (LEXAPRO) 10 MG tablet Take 1 tablet (10 mg total) by mouth daily. Patient taking differently: Take 10 mg by mouth at bedtime.  07/09/15  Yes Debbrah Alar, NP  furosemide (LASIX) 40 MG tablet TAKE 1 TABLET BY MOUTH TWICE DAILY 07/17/15  Yes Lelon Perla, MD  glucosamine-chondroitin 500-400 MG tablet Take 1 tablet by mouth 2 (two) times daily.   Yes Historical Provider, MD  hydrOXYzine (VISTARIL) 25 MG capsule TAKE 1 OR 2 CAPSULES BY MOUTH DAILY FOR ANXIETY 06/20/15  Yes Debbrah Alar, NP  lidocaine (LIDODERM) 5 % Place 1 patch onto the skin as needed. Remove & Discard patch within 12 hours or as directed by MD   Yes Historical Provider, MD  metoprolol (LOPRESSOR) 50 MG tablet Take 0.5 tablets (25 mg total) by mouth daily. 11/25/14  Yes Debbrah Alar, NP  polycarbophil (FIBERCON) 625 MG tablet Take 625 mg by mouth daily.     Yes Historical Provider, MD  polyethylene glycol (MIRALAX / GLYCOLAX) packet Take 17 g by mouth daily as needed.    Yes Historical Provider, MD  potassium chloride SA (K-DUR,KLOR-CON) 20 MEQ tablet TAKE 1 TABLET BY MOUTH DAILY. TAKE AT THE SAME TIME AS FUROSEMIDE 07/18/15  Yes Debbrah Alar, NP   BP 120/68 mmHg  Pulse 57  Temp(Src) 98.7 F (37.1 C) (Oral)  Resp 20  Ht 5\' 2"  (1.575 m)  Wt 111 lb (50.349 kg)  BMI 20.30 kg/m2  SpO2 97%  Vitals reviewed Physical Exam  Physical Examination: General appearance - alert, chronically ill appearing, cachectic and in distress Mental status - alert, oriented to person, place, and time Eyes - no conjunctival injection no scleral  icterus Mouth - mucous membranes moist, pharynx normal without lesions Chest -BSS, bilateral expiratory wheezing, decreased air movement Heart - normal rate, regular rhythm, normal S1, S2, no murmurs, rubs, clicks or gallops Abdomen - soft, nontender, nondistended, no masses or organomegaly Neurological - alert, oriented, normal speech Extremities - peripheral pulses normal, no pedal edema, no clubbing or cyanosis Skin - normal coloration and turgor, no rashes  ED Course  Procedures  DIAGNOSTIC STUDIES: Oxygen Saturation is 67% on room air, low by my interpretation.    COORDINATION OF CARE: 9:00 PM Will give breathing treatment and  levofloxacin. Will order CXR, EKG, and blood work. Discussed treatment plan with pt at bedside and pt agreed to plan.  Labs Review Labs Reviewed  RESPIRATORY VIRUS PANEL - Abnormal; Notable for the following:    Metapneumovirus Positive (*)    All other components within normal limits  CBC - Abnormal; Notable for the following:    RDW 15.7 (*)    All other components within normal limits  BASIC METABOLIC PANEL - Abnormal; Notable for the following:    Glucose, Bld 246 (*)    BUN 22 (*)    Creatinine, Ser 1.16 (*)    GFR calc non Af Amer 43 (*)    GFR calc Af Amer 50 (*)    All other components within normal limits  TROPONIN I - Abnormal; Notable for the following:    Troponin I 0.06 (*)    All other components within normal limits  BRAIN NATRIURETIC PEPTIDE - Abnormal; Notable for the following:    B Natriuretic Peptide 488.3 (*)    All other components within normal limits  HEMOGLOBIN A1C - Abnormal; Notable for the following:    Hgb A1c MFr Bld 6.3 (*)    All other components within normal limits  TROPONIN I - Abnormal; Notable for the following:    Troponin I 0.06 (*)    All other components within normal limits  TROPONIN I - Abnormal; Notable for the following:    Troponin I 0.04 (*)    All other components within normal limits  TROPONIN  I - Abnormal; Notable for the following:    Troponin I 0.04 (*)    All other components within normal limits  PROTIME-INR - Abnormal; Notable for the following:    Prothrombin Time 15.9 (*)    All other components within normal limits  GLUCOSE, CAPILLARY - Abnormal; Notable for the following:    Glucose-Capillary 214 (*)    All other components within normal limits  GLUCOSE, CAPILLARY - Abnormal; Notable for the following:    Glucose-Capillary 156 (*)    All other components within normal limits  GLUCOSE, CAPILLARY - Abnormal; Notable for the following:    Glucose-Capillary 137 (*)    All other components within normal limits  GLUCOSE, CAPILLARY - Abnormal; Notable for the following:    Glucose-Capillary 155 (*)    All other components within normal limits  CBC - Abnormal; Notable for the following:    WBC 16.4 (*)    RBC 3.55 (*)    Hemoglobin 11.1 (*)    HCT 35.0 (*)    All other components within normal limits  BASIC METABOLIC PANEL - Abnormal; Notable for the following:    Potassium 3.4 (*)    Glucose, Bld 164 (*)    BUN 25 (*)    Creatinine, Ser 1.01 (*)    Calcium 8.4 (*)    GFR calc non Af Amer 51 (*)    GFR calc Af Amer 59 (*)    All other components within normal limits  GLUCOSE, CAPILLARY - Abnormal; Notable for the following:    Glucose-Capillary 167 (*)    All other components within normal limits  GLUCOSE, CAPILLARY - Abnormal; Notable for the following:    Glucose-Capillary 156 (*)    All other components within normal limits  GLUCOSE, CAPILLARY - Abnormal; Notable for the following:    Glucose-Capillary 153 (*)    All other components within normal limits  CBC - Abnormal; Notable for the following:  WBC 12.9 (*)    RBC 3.53 (*)    Hemoglobin 11.0 (*)    HCT 34.4 (*)    All other components within normal limits  BASIC METABOLIC PANEL - Abnormal; Notable for the following:    Potassium 3.3 (*)    Chloride 98 (*)    Glucose, Bld 160 (*)    BUN 31 (*)     Calcium 8.3 (*)    GFR calc non Af Amer 53 (*)    All other components within normal limits  GLUCOSE, CAPILLARY - Abnormal; Notable for the following:    Glucose-Capillary 153 (*)    All other components within normal limits  GLUCOSE, CAPILLARY - Abnormal; Notable for the following:    Glucose-Capillary 195 (*)    All other components within normal limits  GLUCOSE, CAPILLARY - Abnormal; Notable for the following:    Glucose-Capillary 177 (*)    All other components within normal limits  GLUCOSE, CAPILLARY - Abnormal; Notable for the following:    Glucose-Capillary 202 (*)    All other components within normal limits  I-STAT ARTERIAL BLOOD GAS, ED - Abnormal; Notable for the following:    pH, Arterial 7.318 (*)    pCO2 arterial 48.3 (*)    Bicarbonate 24.9 (*)    All other components within normal limits  MRSA PCR SCREENING  CULTURE, BLOOD (ROUTINE X 2)  CULTURE, BLOOD (ROUTINE X 2)  CULTURE, EXPECTORATED SPUTUM-ASSESSMENT  GRAM STAIN  INFLUENZA PANEL BY PCR (TYPE A & B, H1N1)  LIPID PANEL  STREP PNEUMONIAE URINARY ANTIGEN  APTT  CBC  BASIC METABOLIC PANEL    Imaging Review No results found. I have personally reviewed and evaluated these images and lab results as part of my medical decision-making.   EKG Interpretation   Date/Time:  Monday August 19 2015 21:03:31 EDT Ventricular Rate:  90 PR Interval:  218 QRS Duration: 140 QT Interval:  396 QTC Calculation: 484 R Axis:   -90 Text Interpretation:  Sinus rhythm Atrial premature complex Borderline  prolonged PR interval Left bundle branch block Since previous tracing QRS  has widened Confirmed by [facility]  MD, Regan Mcbryar [phone]) on 08/19/2015 9:52:33  PM      MDM   Final diagnoses:  None  copd exacerbation Respiratory failure with hypoxia  Pt presenting from triage with O2 sats in the 60s  She was seen immediately upon arrival.  Pt placed on monitor, O2, IV access obtained. Albuterol nebs started as well as  solumedrol and levaquin.  Portable cxr with chronic findings, no acute pneumonia or pulmonary edema .  Pt placed on continuous albuterol- will be admitted to stepdown.  Per chart review patient has long hx of COPD.   10:54 PM d/w Dr. Roel Cluck, triad hospitalist-  Pt to be admitted to step down.  Requests ABG which has been ordered.    CRITICAL CARE Performed by: Threasa Beards Total critical care time: 60 minutes Critical care time was exclusive of separately billable procedures and treating other patients. Critical care was necessary to treat or prevent imminent or life-threatening deterioration. Critical care was time spent personally by me on the following activities: development of treatment plan with patient and/or surrogate as well as nursing, discussions with consultants, evaluation of patient's response to treatment, examination of patient, obtaining history from patient or surrogate, ordering and performing treatments and interventions, ordering and review of laboratory studies, ordering and review of radiographic studies, pulse oximetry and re-evaluation of patient's condition   I personally  performed the services described in this documentation, which was scribed in my presence. The recorded information has been reviewed and is accurate.  Alfonzo Beers, MD 08/22/15 2041

## 2015-08-19 NOTE — ED Notes (Signed)
MD at bedside discussing plan of care and test results with pt and her daughter.

## 2015-08-20 ENCOUNTER — Ambulatory Visit: Payer: Self-pay | Admitting: Family

## 2015-08-20 DIAGNOSIS — I5042 Chronic combined systolic (congestive) and diastolic (congestive) heart failure: Secondary | ICD-10-CM

## 2015-08-20 DIAGNOSIS — J9621 Acute and chronic respiratory failure with hypoxia: Secondary | ICD-10-CM | POA: Diagnosis present

## 2015-08-20 DIAGNOSIS — I482 Chronic atrial fibrillation: Secondary | ICD-10-CM

## 2015-08-20 DIAGNOSIS — Z7901 Long term (current) use of anticoagulants: Secondary | ICD-10-CM

## 2015-08-20 DIAGNOSIS — I447 Left bundle-branch block, unspecified: Secondary | ICD-10-CM

## 2015-08-20 LAB — TROPONIN I
TROPONIN I: 0.04 ng/mL — AB (ref <0.031)
TROPONIN I: 0.04 ng/mL — AB (ref <0.031)
Troponin I: 0.06 ng/mL — ABNORMAL HIGH (ref <0.031)

## 2015-08-20 LAB — GLUCOSE, CAPILLARY
GLUCOSE-CAPILLARY: 156 mg/dL — AB (ref 65–99)
GLUCOSE-CAPILLARY: 167 mg/dL — AB (ref 65–99)
Glucose-Capillary: 214 mg/dL — ABNORMAL HIGH (ref 65–99)
Glucose-Capillary: 137 mg/dL — ABNORMAL HIGH (ref 65–99)
Glucose-Capillary: 155 mg/dL — ABNORMAL HIGH (ref 65–99)

## 2015-08-20 LAB — LIPID PANEL
Cholesterol: 149 mg/dL (ref 0–200)
HDL: 59 mg/dL (ref >40)
LDL CALC: 74 mg/dL (ref 0–99)
Total CHOL/HDL Ratio: 2.5 RATIO
Triglycerides: 80 mg/dL (ref <150)
VLDL: 16 mg/dL (ref 0–40)

## 2015-08-20 LAB — PROTIME-INR
INR: 1.25 (ref 0.00–1.49)
Prothrombin Time: 15.9 seconds — ABNORMAL HIGH (ref 11.6–15.2)

## 2015-08-20 LAB — INFLUENZA PANEL BY PCR (TYPE A & B)
H1N1 flu by pcr: NOT DETECTED
INFLAPCR: NEGATIVE
Influenza B By PCR: NEGATIVE

## 2015-08-20 LAB — MRSA PCR SCREENING: MRSA BY PCR: NEGATIVE

## 2015-08-20 LAB — APTT: aPTT: 27 seconds (ref 24–37)

## 2015-08-20 MED ORDER — ARFORMOTEROL TARTRATE 15 MCG/2ML IN NEBU
15.0000 ug | INHALATION_SOLUTION | Freq: Two times a day (BID) | RESPIRATORY_TRACT | Status: DC
  Administered 2015-08-20 – 2015-08-24 (×9): 15 ug via RESPIRATORY_TRACT
  Filled 2015-08-20 (×9): qty 2

## 2015-08-20 MED ORDER — VITAMIN D3 50 MCG (2000 UT) PO TABS
1.0000 | ORAL_TABLET | Freq: Every day | ORAL | Status: DC
Start: 2015-08-20 — End: 2015-08-20

## 2015-08-20 MED ORDER — METOPROLOL TARTRATE 25 MG PO TABS
25.0000 mg | ORAL_TABLET | Freq: Every day | ORAL | Status: DC
  Administered 2015-08-20 – 2015-08-23 (×4): 25 mg via ORAL
  Filled 2015-08-20 (×4): qty 1

## 2015-08-20 MED ORDER — FUROSEMIDE 10 MG/ML IJ SOLN
20.0000 mg | Freq: Once | INTRAMUSCULAR | Status: AC
  Administered 2015-08-20: 20 mg via INTRAVENOUS
  Filled 2015-08-20: qty 2

## 2015-08-20 MED ORDER — AZITHROMYCIN 500 MG PO TABS: 500.0000 mg | ORAL_TABLET | Freq: Every day | ORAL | Status: DC

## 2015-08-20 MED ORDER — DM-GUAIFENESIN ER 30-600 MG PO TB12
1.0000 | ORAL_TABLET | Freq: Two times a day (BID) | ORAL | Status: DC
  Administered 2015-08-20 – 2015-08-24 (×9): 1 via ORAL
  Filled 2015-08-20 (×9): qty 1

## 2015-08-20 MED ORDER — INSULIN ASPART 100 UNIT/ML ~~LOC~~ SOLN
0.0000 [IU] | Freq: Three times a day (TID) | SUBCUTANEOUS | Status: DC
  Administered 2015-08-20: 1 [IU] via SUBCUTANEOUS
  Administered 2015-08-20 – 2015-08-22 (×6): 2 [IU] via SUBCUTANEOUS
  Administered 2015-08-22: 3 [IU] via SUBCUTANEOUS
  Administered 2015-08-23 (×2): 2 [IU] via SUBCUTANEOUS

## 2015-08-20 MED ORDER — ASPIRIN EC 81 MG PO TBEC
81.0000 mg | DELAYED_RELEASE_TABLET | Freq: Every day | ORAL | Status: DC
  Administered 2015-08-20 – 2015-08-24 (×5): 81 mg via ORAL
  Filled 2015-08-20 (×5): qty 1

## 2015-08-20 MED ORDER — INSULIN ASPART 100 UNIT/ML ~~LOC~~ SOLN: 0.0000 [IU] | Freq: Every day | SUBCUTANEOUS | Status: DC

## 2015-08-20 MED ORDER — LORAZEPAM 0.5 MG PO TABS
0.5000 mg | ORAL_TABLET | Freq: Four times a day (QID) | ORAL | Status: DC | PRN
  Administered 2015-08-20 – 2015-08-21 (×2): 0.5 mg via ORAL
  Filled 2015-08-20 (×2): qty 1

## 2015-08-20 MED ORDER — IPRATROPIUM-ALBUTEROL 0.5-2.5 (3) MG/3ML IN SOLN
3.0000 mL | RESPIRATORY_TRACT | Status: DC
  Administered 2015-08-20 (×4): 3 mL via RESPIRATORY_TRACT
  Filled 2015-08-20 (×4): qty 3

## 2015-08-20 MED ORDER — NICOTINE 21 MG/24HR TD PT24
21.0000 mg | MEDICATED_PATCH | Freq: Every day | TRANSDERMAL | Status: DC
  Administered 2015-08-20 – 2015-08-24 (×5): 21 mg via TRANSDERMAL
  Filled 2015-08-20 (×5): qty 1

## 2015-08-20 MED ORDER — DEXTROSE 5 % IV SOLN
500.0000 mg | INTRAVENOUS | Status: DC
  Administered 2015-08-20 – 2015-08-22 (×3): 500 mg via INTRAVENOUS
  Filled 2015-08-20 (×6): qty 500

## 2015-08-20 MED ORDER — ALBUTEROL SULFATE (2.5 MG/3ML) 0.083% IN NEBU
2.5000 mg | INHALATION_SOLUTION | RESPIRATORY_TRACT | Status: DC | PRN
  Administered 2015-08-20 – 2015-08-23 (×3): 2.5 mg via RESPIRATORY_TRACT
  Filled 2015-08-20 (×4): qty 3

## 2015-08-20 MED ORDER — AZITHROMYCIN 500 MG PO TABS: 250.0000 mg | ORAL_TABLET | Freq: Every day | ORAL | Status: DC

## 2015-08-20 MED ORDER — IPRATROPIUM-ALBUTEROL 0.5-2.5 (3) MG/3ML IN SOLN
3.0000 mL | Freq: Four times a day (QID) | RESPIRATORY_TRACT | Status: DC
  Administered 2015-08-21 – 2015-08-22 (×5): 3 mL via RESPIRATORY_TRACT
  Filled 2015-08-20 (×5): qty 3

## 2015-08-20 MED ORDER — LIDOCAINE 5 % EX PTCH
1.0000 | MEDICATED_PATCH | CUTANEOUS | Status: DC | PRN
  Administered 2015-08-20 – 2015-08-21 (×2): 1 via TRANSDERMAL
  Filled 2015-08-20 (×3): qty 1

## 2015-08-20 MED ORDER — ONDANSETRON HCL 4 MG/2ML IJ SOLN
INTRAMUSCULAR | Status: AC
  Administered 2015-08-20: 4 mg
  Filled 2015-08-20: qty 2

## 2015-08-20 MED ORDER — ATORVASTATIN CALCIUM 40 MG PO TABS
40.0000 mg | ORAL_TABLET | Freq: Every day | ORAL | Status: DC
  Administered 2015-08-20 – 2015-08-23 (×4): 40 mg via ORAL
  Filled 2015-08-20 (×4): qty 1

## 2015-08-20 MED ORDER — HEPARIN SODIUM (PORCINE) 5000 UNIT/ML IJ SOLN
5000.0000 [IU] | Freq: Three times a day (TID) | INTRAMUSCULAR | Status: DC
  Administered 2015-08-20 – 2015-08-24 (×11): 5000 [IU] via SUBCUTANEOUS
  Filled 2015-08-20 (×10): qty 1

## 2015-08-20 MED ORDER — POLYETHYLENE GLYCOL 3350 17 G PO PACK: 17.0000 g | PACK | Freq: Every day | ORAL | Status: DC | PRN

## 2015-08-20 MED ORDER — METHYLPREDNISOLONE SODIUM SUCC 125 MG IJ SOLR
125.0000 mg | Freq: Once | INTRAMUSCULAR | Status: AC
  Administered 2015-08-20: 125 mg via INTRAVENOUS
  Filled 2015-08-20: qty 2

## 2015-08-20 MED ORDER — APIXABAN 2.5 MG PO TABS: 2.5000 mg | ORAL_TABLET | Freq: Two times a day (BID) | ORAL | Status: DC

## 2015-08-20 MED ORDER — BUDESONIDE 0.25 MG/2ML IN SUSP
0.2500 mg | Freq: Two times a day (BID) | RESPIRATORY_TRACT | Status: DC
  Administered 2015-08-20 – 2015-08-24 (×9): 0.25 mg via RESPIRATORY_TRACT
  Filled 2015-08-20 (×9): qty 2

## 2015-08-20 MED ORDER — VITAMIN D 1000 UNITS PO TABS
1000.0000 [IU] | ORAL_TABLET | Freq: Every day | ORAL | Status: DC
  Administered 2015-08-20 – 2015-08-24 (×5): 1000 [IU] via ORAL
  Filled 2015-08-20 (×5): qty 1

## 2015-08-20 MED ORDER — FUROSEMIDE 40 MG PO TABS
40.0000 mg | ORAL_TABLET | Freq: Two times a day (BID) | ORAL | Status: DC
  Administered 2015-08-20 – 2015-08-24 (×9): 40 mg via ORAL
  Filled 2015-08-20 (×9): qty 1

## 2015-08-20 MED ORDER — DEXTROSE 5 % IV SOLN
1.0000 g | INTRAVENOUS | Status: DC
  Administered 2015-08-20 – 2015-08-22 (×3): 1 g via INTRAVENOUS
  Filled 2015-08-20 (×6): qty 10

## 2015-08-20 MED ORDER — AMIODARONE HCL 200 MG PO TABS
400.0000 mg | ORAL_TABLET | Freq: Every day | ORAL | Status: DC
Start: 2015-08-20 — End: 2015-08-24
  Administered 2015-08-20 – 2015-08-24 (×5): 400 mg via ORAL
  Filled 2015-08-20 (×5): qty 2

## 2015-08-20 MED ORDER — DONEPEZIL HCL 5 MG PO TABS
5.0000 mg | ORAL_TABLET | Freq: Every day | ORAL | Status: DC
  Administered 2015-08-20 – 2015-08-23 (×4): 5 mg via ORAL
  Filled 2015-08-20 (×4): qty 1

## 2015-08-20 MED ORDER — METHYLPREDNISOLONE SODIUM SUCC 125 MG IJ SOLR: 60.0000 mg | Freq: Three times a day (TID) | INTRAMUSCULAR | Status: DC

## 2015-08-20 MED ORDER — LEVOFLOXACIN IN D5W 750 MG/150ML IV SOLN: 750.0000 mg | INTRAVENOUS | Status: DC

## 2015-08-20 MED ORDER — METHYLPREDNISOLONE SODIUM SUCC 125 MG IJ SOLR
60.0000 mg | Freq: Four times a day (QID) | INTRAMUSCULAR | Status: DC
  Administered 2015-08-20 – 2015-08-22 (×8): 60 mg via INTRAVENOUS
  Filled 2015-08-20 (×9): qty 2

## 2015-08-20 MED ORDER — BLACK COHOSH 540 MG PO CAPS: 540.0000 mg | ORAL_CAPSULE | Freq: Every day | ORAL | Status: DC

## 2015-08-20 MED ORDER — CALCIUM POLYCARBOPHIL 625 MG PO TABS
625.0000 mg | ORAL_TABLET | Freq: Every day | ORAL | Status: DC
  Administered 2015-08-20 – 2015-08-24 (×5): 625 mg via ORAL
  Filled 2015-08-20 (×5): qty 1

## 2015-08-20 MED ORDER — HYDROXYZINE PAMOATE 25 MG PO CAPS
25.0000 mg | ORAL_CAPSULE | Freq: Every day | ORAL | Status: DC | PRN
  Filled 2015-08-20: qty 1

## 2015-08-20 MED ORDER — GLUCOSAMINE-CHONDROITIN 500-400 MG PO TABS: 1.0000 | ORAL_TABLET | Freq: Two times a day (BID) | ORAL | Status: DC

## 2015-08-20 MED ORDER — ESCITALOPRAM OXALATE 10 MG PO TABS
10.0000 mg | ORAL_TABLET | Freq: Every day | ORAL | Status: DC
  Administered 2015-08-20 – 2015-08-24 (×5): 10 mg via ORAL
  Filled 2015-08-20 (×5): qty 1

## 2015-08-20 MED ORDER — CALCIUM CARBONATE-VITAMIN D 500-200 MG-UNIT PO TABS
1.0000 | ORAL_TABLET | Freq: Two times a day (BID) | ORAL | Status: DC
  Administered 2015-08-20 – 2015-08-24 (×9): 1 via ORAL
  Filled 2015-08-20 (×9): qty 1

## 2015-08-20 NOTE — H&P (Signed)
[facility] History and Physical  Sarah [name] [id number] DOB: 21-Jul-1931 DOA: 08/19/2015  Referring physician: ED physician PCP: Nance Pear., NP  Specialists:   Chief Complaint: Cough and shortness of breath  HPI: Sarah [name] is a 82 y.o. female with PMH of hypertension, hyperlipidemia, diabetes mellitus, COPD, depression, tobacco abuse, OSA, atrial fibrillation on Eliquis, combined systolic and diastolic CHF with [id number], LBBB, pancreas divisum, who presents with cough and shortness of breath.  Patient reports that she has been having shortness of breath and cough in the past several days, which has been progressively getting worse. She coughs up clear mucus. No fever, chills, runny nose, sore throat, chest pain. Patient does not have nausea, vomiting, abdominal pain, symptoms of UTI. No unilateral weakness.  In ED, patient was found to have elevated troponin 0.06, BNP 488.3, WBC 9.5, temperature normal, no tachycardia, has tachypnea, creatinine 1.16. Chest x-ray showed fibrotic change throughout the lungs bilaterally, most severe in the bases bilaterally, a stable finding. No frank edema or consolidation. Stable cardiac silhouette. The patient is admitted to inpatient for further intervention and treatment.  EKG: Independently reviewed. QTC 484, sinus rhythm, left bundle blockage, first degree AV block  Where does patient live?   At home  Can patient participate in ADLs?  Barely  Review of Systems:   General: no fevers, chills, no changes in body weight, has poor appetite, has fatigue HEENT: no blurry vision, hearing changes or sore throat Pulm: has dyspnea, coughing, wheezing CV: no chest pain, no palpitations Abd: no nausea, vomiting, abdominal pain, diarrhea, constipation GU: no dysuria, burning on urination, increased urinary frequency, hematuria  Ext: no leg edema Neuro: no unilateral weakness, numbness, or tingling, no vision change or hearing  loss Skin: no rash MSK: No muscle spasm, no deformity, no limitation of range of movement in spin Heme: No easy bruising.  Travel history: No recent long distant travel.  Allergy:  Allergies  Allergen Reactions  . Morphine And Related Nausea And Vomiting    Past Medical History  Diagnosis Date  . Hypertension   . Hyperlipidemia   . Sleep apnea   . Atrial fibrillation with RVR ([place])     a. 06/2014 admitted and failed dccv x 2, amio/eliquis started;  b. CHA2DS2VASc = 6 (eliquis).  . Chronic combined systolic and diastolic CHF (congestive heart failure) ([place])     a. 06/2014 TEE: EF 40-45%, no thrombus, mild MR.  . LBBB (left bundle branch block)   . Anemia   . Diabetes type 2, controlled ([place])   . Tobacco abuse   . Cardiomyopathy ([place])     a. 06/2014 TEE: EF 40-45%, no thrombus, mild MR.  Marland Kitchen COPD (chronic obstructive pulmonary disease) ([place])   . Elevated LFTs   . Pancreas divisum   . Aortic atherosclerosis ([place])   . Internal hemorrhoids   . Hyperplastic colon polyp     Past Surgical History  Procedure Laterality Date  . Abdominal hysterectomy  1965  . Hemorroidectomy  1957  . Breast lumpectomy  1966, 1971  . Tee without cardioversion N/A 06/04/2014    Procedure: TRANSESOPHAGEAL ECHOCARDIOGRAM (TEE);  Surgeon: Thayer Headings, MD;  Location: [facility];  Service: Cardiovascular;  Laterality: N/A;  . Cardioversion N/A 06/04/2014    Procedure: CARDIOVERSION;  Surgeon: Thayer Headings, MD;  Location: [facility];  Service: Cardiovascular;  Laterality: N/A;    Social History:  reports that she has been smoking Cigarettes.  She has a 62.5 pack-year smoking  history. She has never used smokeless tobacco. She reports that she does not drink alcohol or use illicit drugs.  Family History:  Family History  Problem Relation Age of Onset  . Diabetes    . Hyperlipidemia Neg Hx   . Heart attack Neg Hx   . Hypertension Neg Hx   . Sudden death Neg Hx   . Colon cancer      grandson  .  Diabetes Mother      Prior to Admission medications   Medication Sig Start Date End Date Taking? Authorizing Provider  alendronate (FOSAMAX) 70 MG tablet TAKE 1 TABLET BY MOUTH EVERY 7 DAYS. TAKE WITH A FULL GLASS OF WATER ON AN EMPTY STOMACH. 05/22/15   Debbrah Alar, NP  amiodarone (PACERONE) 200 MG tablet TAKE TWO TABLETS BY MOUTH DAILY 07/17/15   Almyra Deforest, PA  apixaban (ELIQUIS) 2.5 MG TABS tablet Take 1 tablet (2.5 mg total) by mouth 2 (two) times daily. 06/21/15   Debbrah Alar, NP  atorvastatin (LIPITOR) 40 MG tablet TAKE 1 TABLET (40 MG TOTAL) BY MOUTH DAILY. 04/02/15   Debbrah Alar, NP  Black Cohosh 540 MG CAPS Take 540 mg by mouth daily.    Historical Provider, MD  Calcium Carbonate-Vitamin D (CALTRATE 600+D PO) Take 1 tablet by mouth 2 (two) times daily.    Historical Provider, MD  Cholecalciferol (VITAMIN D3) 2000 UNITS TABS Take 1 tablet by mouth daily.      Historical Provider, MD  Docusate Sodium (DOC-Q-LACE PO) Take by mouth.    Historical Provider, MD  donepezil (ARICEPT) 10 MG tablet Take 1/2 tablet daily for 2 weeks, then increase to 1 tablet daily 04/15/15   Cameron Sprang, MD  escitalopram (LEXAPRO) 10 MG tablet Take 1 tablet (10 mg total) by mouth daily. 07/09/15   Debbrah Alar, NP  furosemide (LASIX) 40 MG tablet TAKE 1 TABLET BY MOUTH TWICE DAILY 07/17/15   Lelon Perla, MD  glucosamine-chondroitin 500-400 MG tablet Take 1 tablet by mouth 2 (two) times daily.    Historical Provider, MD  hydrOXYzine (VISTARIL) 25 MG capsule TAKE 1 OR 2 CAPSULES BY MOUTH DAILY FOR ANXIETY 06/20/15   Debbrah Alar, NP  lidocaine (LIDODERM) 5 % Place 1 patch onto the skin as needed. Remove & Discard patch within 12 hours or as directed by MD    Historical Provider, MD  metoprolol (LOPRESSOR) 50 MG tablet Take 0.5 tablets (25 mg total) by mouth daily. 11/25/14   Debbrah Alar, NP  polycarbophil (FIBERCON) 625 MG tablet Take 625 mg by mouth daily.      Historical  Provider, MD  polyethylene glycol (MIRALAX / GLYCOLAX) packet Take 17 g by mouth daily as needed.     Historical Provider, MD  potassium chloride SA (K-DUR,KLOR-CON) 20 MEQ tablet TAKE 1 TABLET BY MOUTH DAILY. TAKE AT THE SAME TIME AS FUROSEMIDE 07/18/15   Debbrah Alar, NP    Physical Exam: Filed Vitals:   08/20/15 0040 08/20/15 0049 08/20/15 0100 08/20/15 0252  BP:  95/56 97/50 125/46  Pulse:  89 103 85  Temp:    98.3 F (36.8 C)  TempSrc:   Axillary Axillary  Resp:  28 29 27   Height:      Weight:      SpO2: 95% 96% 93% 99%   General: Not in acute distress HEENT:       Eyes: PERRL, EOMI, no scleral icterus.       ENT: No discharge from the ears and nose,  no pharynx injection, no tonsillar enlargement.        Neck: No JVD, no bruit, no mass felt. Heme: No neck lymph node enlargement. Cardiac: S1/S2, RRR, No murmurs, No gallops or rubs. Pulm: has decreased air movement bilaterally. Has mild wheezing and rhonchi bilaterally. No rales or rubs. Abd: Soft, nondistended, nontender, no rebound pain, no organomegaly, BS present. Ext: No pitting leg edema bilaterally. 2+DP/PT pulse bilaterally. Musculoskeletal: No joint deformities, No joint redness or warmth, no limitation of ROM in spin. Skin: No rashes.  Neuro: Alert, oriented X3, cranial nerves II-XII grossly intact, moves all extremities normally. Psych: Patient is not psychotic, no suicidal or hemocidal ideation.  Labs on Admission:  Basic Metabolic Panel:  Recent Labs Lab 08/19/15 2100  NA 140  K 4.1  CL 105  CO2 24  GLUCOSE 246*  BUN 22*  CREATININE 1.16*  CALCIUM 9.1   Liver Function Tests: No results for input(s): AST, ALT, ALKPHOS, BILITOT, PROT, ALBUMIN in the last 168 hours. No results for input(s): LIPASE, AMYLASE in the last 168 hours. No results for input(s): AMMONIA in the last 168 hours. CBC:  Recent Labs Lab 08/19/15 2100  WBC 9.5  HGB 13.1  HCT 40.2  MCV 99.5  PLT 272   Cardiac  Enzymes:  Recent Labs Lab 08/19/15 2100  TROPONINI 0.06*    BNP (last 3 results)  Recent Labs  08/19/15 2100  BNP 488.3*    ProBNP (last 3 results) No results for input(s): PROBNP in the last 8760 hours.  CBG: No results for input(s): GLUCAP in the last 168 hours.  Radiological Exams on Admission: Dg Chest Portable 1 View  08/19/2015  CLINICAL DATA:  Respiratory distress EXAM: PORTABLE CHEST 1 VIEW COMPARISON:  July 08, 2015 FINDINGS: Lungs are somewhat hyperexpanded. There is persistent fibrotic change throughout the lungs, most severely in the basilar regions bilaterally. There is no frank edema or consolidation. Heart is upper normal in size with pulmonary vascularity within normal limits. There is atherosclerotic calcification in the aorta. No adenopathy. There is extensive arthropathy in both shoulders, more severe on the left than on the right. IMPRESSION: Fibrotic change throughout the lungs bilaterally, most severe in the bases bilaterally, a stable finding. No frank edema or consolidation. Stable cardiac silhouette. Electronically Signed   By: Lowella Grip III M.D.   On: 08/19/2015 21:14    Assessment/Plan Principal Problem:   Acute on chronic respiratory failure with hypoxia (HCC) Active Problems:   Essential hypertension   OSA (obstructive sleep apnea)   A-fib (HCC)   LBBB (left bundle branch block)   Diabetes type 2, controlled (HCC)   Chronic combined systolic and diastolic CHF (congestive heart failure) (HCC)   Hyperlipidemia   Tobacco abuse   Chronic anticoagulation   COPD exacerbation (HCC)   Elevated troponin   Acute on chronic respiratory failure with hypoxia (HCC) due to COPD exacerbation:  No infiltration on chest x-ray. Patient does not have leg edema or JVD, less likely to have CHF exacerbation.  -will admit patient to SDU -Nebulizers: scheduled Duoneb and prn albuterol -Solu-Medrol 60 mg IV tid -Azithromycin for 5 days (pt received on dose  of levaquine in ED)  -Mucinex for cough  -Urine S. pneumococcal antigen -Follow up blood culture x2, sputum culture, respiratory virus panel, Flu pcr  Chronic combined systolic and diastolic CHF (congestive heart failure) ([place]): 2-D echo on 06/06/14 showed EF of 40%. Patient does not have leg edema JVD. CHF is compensated. -continue Lasix and  metoprolol  Elevated troponin: Troponin 0.06. No chest pain. Likely due to demanding ischemia - cycle CE q6 x3 and repeat her EKG in the am  - Lipitor  - Risk factor stratification: will check FLP and A1C  - 2d echo  HTN: -on lasix and metoprolol  Atrial Fibrillation: CHA2DS2-VASc Score is 6, needs oral anticoagulation. Patient is on Eliquis at home. Heart rate is well controlled. -Continue amiodarone, metoprolol  DM-II: Last A1c 5.9 on 10/14/14, fairly well controled. Patient is not taking meds at home -SSI -Check A1c  HLD: Last LDL was 99 on 07/08/15 -Continue home medications: Lipitor -Check FLP  Tobacco abuse -Did counseling about importance of quitting smoking -Nicotine patch  Depression and anxiety: Stable, no suicidal or homicidal ideations. -Continue home medications: Lexapro and hydroxyzine   DVT ppx: on Eliquis Code Status: Full code Family Communication: None at bed side.  Disposition Plan: Admit to inpatient   Date of Service 08/20/2015    Ivor Costa [facility] Pager [phone]  If 7PM-7AM, please contact night-coverage [email or web address] Password [email or web address] 08/20/2015, 3:21 AM

## 2015-08-20 NOTE — Progress Notes (Signed)
[facility]                                                                              Patient Demographics  Sarah [name], is a 82 y.o. female, DOB - 1932/03/09, [id number]  Admit date - 08/19/2015   Admitting Physician Ivor Costa, MD  Outpatient Primary MD for the patient is Nance Pear., NP  Outpatient specialists:   LOS - 1  days    Chief Complaint  Patient presents with  . Shortness of Breath       Brief HPI   Patient is a 82 y.o. female with hypertension, hyperlipidemia, diabetes mellitus, COPD, depression, tobacco abuse, OSA, atrial fibrillation on Eliquis, combined systolic and diastolic CHF with EF [id number], LBBB, pancreas divisum, who presented with cough and shortness of breath. The patient reported progressively worsening dyspnea, cough in the last few days with no fever, chills, sore throat or chest pain. In ED, patient was found to have elevated troponin 0.06, BNP 488.3, WBC 9.5, temperature normal, no tachycardia, has tachypnea, creatinine 1.16. Chest x-ray showed fibrotic change throughout the lungs bilaterally, most severe in the bases bilaterally, a stable finding. No frank edema or consolidation.  EKG: Independently reviewed. QTC 484, sinus rhythm, left bundle blockage, first degree AV block    Assessment & Plan    Principal Problem:   Acute on chronic respiratory failure with hypoxia (HCC)Likely due to underlying fibrosis, COPD exacerbation -Patient diffusely wheezing, audibly at the time of my examination, on NRB  -  Solu-Medrol 125 mg IV x1, then 60 mg q6hrs, IV Rocephin, IV Zithromax, Brovana, Pulmicort, flutter valve  -  wean O2 as tolerated  - BNP 488, follow 2-D echo, patient is on Lasix outpatient, will continue with extra dose of Lasix 20 mg IV 1  - Follow blood cultures, sputum cultures, flu PCR negative  Chronic combined systolic and diastolic CHF (congestive heart failure) ([place]): 2-D echo on 06/06/14 showed EF of 40%.  CHF is compensated. -continue Lasix and metoprolol  Elevated troponin: Possibly due to acute respiratory failure and demand ischemia  - Patient is not on eliquis, placed on aspirin 81 mg daily - Follow 2-D echocardiogram  HTN: -on lasix and metoprolol  Atrial Fibrillation: CHA2DS2-VASc Score is 6, needs oral anticoagulation. Patient is on Eliquis at home. Heart rate is well controlled. -Continue amiodarone, metoprolol for rate control - Patient's daughter confirmed that patient is not longer taking eliquis due to recent falls  DM-II: Last A1c 5.9 on 10/14/14 -SSI, follow closely with steroids on board -Check A1c  HLD: Last LDL was 99 on 07/08/15 -Continue home medications: Lipitor -Lipid panel with LDL 74  Tobacco abuse -Smoking cessation counseling provided -Nicotine patch  Depression and anxiety: Stable, no suicidal or homicidal ideations. -Continue home medications: Lexapro and hydroxyzine   Code Status: DO NOT RESUSCITATE  Family Communication: Discussed in detail with the patient, all imaging results, lab results explained to the patient. Called patient's daughter on the phone, was unable to make contact.   Disposition Plan: During the examination, patient continued to ask to go home, however she has acute hypoxic respiratory failure, on NRB, not  medically stable for discharge   Time Spent in minutes   42minutes  Procedures  None  Consults   None  DVT Prophylaxis Place on heparin subcutaneous  Medications  Scheduled Meds: . amiodarone  400 mg Oral Daily  . arformoterol  15 mcg Nebulization BID  . aspirin EC  81 mg Oral Daily  . atorvastatin  40 mg Oral q1800  . azithromycin  500 mg Intravenous Q24H  . budesonide (PULMICORT) nebulizer solution  0.25 mg Nebulization BID  . calcium-vitamin D  1 tablet Oral BID  . cefTRIAXone (ROCEPHIN)  IV  1 g Intravenous Q24H  . cholecalciferol  1,000 Units Oral Daily  . dextromethorphan-guaiFENesin  1 tablet Oral BID  .  donepezil  5 mg Oral QHS  . escitalopram  10 mg Oral Daily  . furosemide  40 mg Oral BID  . insulin aspart  0-5 Units Subcutaneous QHS  . insulin aspart  0-9 Units Subcutaneous TID WC  . ipratropium-albuterol  3 mL Nebulization Q3H  . methylPREDNISolone (SOLU-MEDROL) injection  60 mg Intravenous Q6H  . metoprolol  25 mg Oral Daily  . nicotine  21 mg Transdermal Daily  . polycarbophil  625 mg Oral Daily   Continuous Infusions:  PRN Meds:.albuterol, hydrOXYzine, lidocaine, LORazepam, polyethylene glycol   Antibiotics   Anti-infectives    Start     Dose/Rate Route Frequency Ordered Stop   08/21/15 2100  azithromycin (ZITHROMAX) tablet 250 mg  Status:  Discontinued     250 mg Oral Daily 08/20/15 0316 08/20/15 0728   08/21/15 2000  levofloxacin (LEVAQUIN) IVPB 750 mg  Status:  Discontinued     750 mg 100 mL/hr over 90 Minutes Intravenous Every 48 hours 08/20/15 0227 08/20/15 0316   08/20/15 2100  azithromycin (ZITHROMAX) tablet 500 mg  Status:  Discontinued     500 mg Oral Daily 08/20/15 0316 08/20/15 0728   08/20/15 2000  azithromycin (ZITHROMAX) 500 mg in dextrose 5 % 250 mL IVPB     500 mg 250 mL/hr over 60 Minutes Intravenous Every 24 hours 08/20/15 0728     08/20/15 2000  cefTRIAXone (ROCEPHIN) 1 g in dextrose 5 % 50 mL IVPB     1 g 100 mL/hr over 30 Minutes Intravenous Every 24 hours 08/20/15 0728     08/19/15 2115  levofloxacin (LEVAQUIN) IVPB 750 mg  Status:  Discontinued     750 mg 100 mL/hr over 90 Minutes Intravenous Every 24 hours 08/19/15 2105 08/20/15 0227        Subjective:   [name] was seen and examined today.  Continue to ask to go home, alert and oriented. Audibly wheezing, on NRB. Has shortness of breath but no fevers or chills or any chest pain at this time. Patient denies dizziness, chest pain,  abdominal pain, N/V/D/C, new weakness, numbess, tingling. No acute events overnight.    Objective:   Filed Vitals:   08/20/15 0725 08/20/15 0738  08/20/15 0836 08/20/15 1020  BP: 95/45 90/59 90/38  115/51  Pulse: 79 83 73 73  Temp:   99.4 F (37.4 C)   TempSrc:   Axillary   Resp: 19 23 24 25   Height:      Weight:      SpO2: 98% 98% 97% 98%    Intake/Output Summary (Last 24 hours) at 08/20/15 1047 Last data filed at 08/20/15 0900  Gross per 24 hour  Intake      0 ml  Output      0 ml  Net      0 ml     Wt Readings from Last 3 Encounters:  08/19/15 50.349 kg (111 lb)  07/08/15 50.531 kg (111 lb 6.4 oz)  07/03/15 50.349 kg (111 lb)     Exam  General: Alert and oriented x 3, NAD, in  Mild res distress, on NRB  HEENT:    Neck: Supple, no JVD  CVS: S1 S2 auscultated, no rubs, murmurs or gallops. Regular rate and rhythm.  Respiratory: Diffuse wheezing bilaterally   Abdomen: Soft, nontender, nondistended, + bowel sounds  Ext: no cyanosis clubbing or edema  Neuro: AAOx3, Cr N's II- XII. Strength 5/5 upper and lower extremities bilaterally  Skin: No rashes  Psych: Normal affect and demeanor, alert and oriented x3    Data Reviewed:  I have personally reviewed following labs and imaging studies  Micro Results Recent Results (from the past 240 hour(s))  MRSA PCR Screening     Status: None   Collection Time: 08/20/15  2:38 AM  Result Value Ref Range Status   MRSA by PCR NEGATIVE NEGATIVE Final    Comment:        The GeneXpert MRSA Assay (FDA approved for NASAL specimens only), is one component of a comprehensive MRSA colonization surveillance program. It is not intended to diagnose MRSA infection nor to guide or monitor treatment for MRSA infections.     Radiology Reports Dg Chest Portable 1 View  08/19/2015  CLINICAL DATA:  Respiratory distress EXAM: PORTABLE CHEST 1 VIEW COMPARISON:  July 08, 2015 FINDINGS: Lungs are somewhat hyperexpanded. There is persistent fibrotic change throughout the lungs, most severely in the basilar regions bilaterally. There is no frank edema or consolidation. Heart is  upper normal in size with pulmonary vascularity within normal limits. There is atherosclerotic calcification in the aorta. No adenopathy. There is extensive arthropathy in both shoulders, more severe on the left than on the right. IMPRESSION: Fibrotic change throughout the lungs bilaterally, most severe in the bases bilaterally, a stable finding. No frank edema or consolidation. Stable cardiac silhouette. Electronically Signed   By: Lowella Grip III M.D.   On: 08/19/2015 21:14    CBC  Recent Labs Lab 08/19/15 2100  WBC 9.5  HGB 13.1  HCT 40.2  PLT 272  MCV 99.5  MCH 32.4  MCHC 32.6  RDW 15.7*    Chemistries   Recent Labs Lab 08/19/15 2100  NA 140  K 4.1  CL 105  CO2 24  GLUCOSE 246*  BUN 22*  CREATININE 1.16*  CALCIUM 9.1   ------------------------------------------------------------------------------------------------------------------ estimated creatinine clearance is 30.1 mL/min (by C-G formula based on Cr of 1.16). ------------------------------------------------------------------------------------------------------------------ No results for input(s): HGBA1C in the last 72 hours. ------------------------------------------------------------------------------------------------------------------  Recent Labs  08/20/15 0358  CHOL 149  HDL 59  LDLCALC 74  TRIG 80  CHOLHDL 2.5   ------------------------------------------------------------------------------------------------------------------ No results for input(s): TSH, T4TOTAL, T3FREE, THYROIDAB in the last 72 hours.  Invalid input(s): FREET3 ------------------------------------------------------------------------------------------------------------------ No results for input(s): VITAMINB12, FOLATE, FERRITIN, TIBC, IRON, RETICCTPCT in the last 72 hours.  Coagulation profile  Recent Labs Lab 08/20/15 0358  INR 1.25    No results for input(s): DDIMER in the last 72 hours.  Cardiac Enzymes  Recent  Labs Lab 08/19/15 2100 08/20/15 0318  TROPONINI 0.06* 0.06*   ------------------------------------------------------------------------------------------------------------------ Invalid input(s): POCBNP   Recent Labs  08/20/15 0348 08/20/15 0911  GLUCAP 214* 156*     RAI,RIPUDEEP M.D. [facility] 08/20/2015, 10:47 AM  Pager: [phone] Between 7am to  7pm - call Pager - [phone]  After 7pm go to [email or web address] - password [email or web address]  Call night coverage person covering after 7pm

## 2015-08-20 NOTE — Progress Notes (Signed)
Titrated pt from a VM to [place] 3 Lpm with humidity. Sat 99%. RN aware.

## 2015-08-20 NOTE — Progress Notes (Signed)
Spoke with Pts daughter , [name] about pt and condition. Pts daughter agreed to have pt become DNR. Verified this over the phone with Helmut Muster. Daughter also stated that pt takes lexapro at bedtime and calls this her "sleep" medicine and that pt is no longer taking eliquis due to the amount of falls that the pt has been experiencing. Note from DR Stanford Breed on 3/29 talks about discontinuation of eliquis.

## 2015-08-20 NOTE — Evaluation (Signed)
Physical Therapy Evaluation Patient Details Name: Sarah [name] MRN: [id number] DOB: 07-Aug-1931 Today's Date: 08/20/2015   History of Present Illness  Sarah [name] is a 82 y.o. female who presents to the Emergency Department with a PMHx of HTN, DM, atrial fibrillation, and COPD complaining of SOB with an associated cough, PTA. Daughter states she has been hearing a change in the patient breath sounds for the past several weeks. She has been trying to get the pt to see a Pulmonologist for quite sometime and finally made an appointment to see them tomorrow. However, tonight, the pts breathing worsened so the daughter brought the pt to the ED for evaluation.     Clinical Impression  Pt admitted with above diagnosis. Pt currently with functional limitations due to the deficits listed below (see PT Problem List). Pt needed max encouragement to get out of bed. Pt needs 40% 8L on venturi mask to maintain Sats above 90% and desats to 78% without it but pt states that she refuses to go home on oxygen and she is "going home tomorrow no matter what". She states that she does not want to take oxygen home because she can't smoke on the oxygen, and she would "rather die than quit smoking". Pt is not safe to go home because she is at home by herself during the day, and is not aware of her deficits. Pt stated that she wants to go see her daughter and her mother that passsed away. Pt may benefit from palliative care consult or goals of care meeting. Pt will benefit from skilled PT to increase their independence and safety with mobility to allow discharge to the venue listed below.  Will continue as pt allows.     Follow Up Recommendations Home health PT;Supervision/Assistance - 24 hour    Equipment Recommendations  Rolling walker with 5" wheels;3in1 (PT)    Recommendations for Other Services OT consult     Precautions / Restrictions Precautions Precautions: Fall Restrictions Weight Bearing  Restrictions: No      Mobility  Bed Mobility Overal bed mobility: Needs Assistance;+2 for physical assistance Bed Mobility: Supine to Sit     Supine to sit: Mod assist;+2 for physical assistance     General bed mobility comments: Pt needed assist as she did not want to get up OOB.  However she reported that she had just wet the bed. Bed soaked and PT had to provide max encouragement for pt to get OOB.  Cleaned pt and changed all linen.    Transfers Overall transfer level: Needs assistance Equipment used: 2 person hand held assist Transfers: Sit to/from [facility] Sit to Stand: Min assist Stand pivot transfers: Min assist       General transfer comment: Pt refused to walk, but stated that she would get in the chair so that her bed could get cleaned. Pt held onto therapists arms and shuffled her feet to the chair. Min A for balance.  Ambulation/Gait             General Gait Details: pt refused.   Stairs            Wheelchair Mobility    Modified Rankin (Stroke Patients Only)       Balance Overall balance assessment: Needs assistance Sitting-balance support: Bilateral upper extremity supported;Feet supported Sitting balance-Leahy Scale: Fair     Standing balance support: Bilateral upper extremity supported Standing balance-Leahy Scale: Poor Standing balance comment: Needed UE support and Min A for balance.  Pertinent Vitals/Pain Pain Assessment: No/denies pain    Home Living Family/patient expects to be discharged to:: Private residence Living Arrangements: Children;Other relatives Available Help at Discharge: Family;Available PRN/intermittently (daughter and granddaughter work) Type of Home: House Home Access: Stairs to enter Entrance Stairs-Rails: Right;Left;Can reach both Technical brewer of Steps: 3 Home Layout: Two level;1/2 bath on main level (pt has bedroom on main level. ) Home  Equipment: None      Prior Function Level of Independence: Independent         Comments: pt with frequent falls per chart     Hand Dominance        Extremity/Trunk Assessment   Upper Extremity Assessment: Defer to OT evaluation           Lower Extremity Assessment: Generalized weakness      Cervical / Trunk Assessment: Kyphotic  Communication   Communication: No difficulties  Cognition Arousal/Alertness: Awake/alert Behavior During Therapy: Flat affect;Anxious Overall Cognitive Status: Impaired/Different from baseline Area of Impairment: Safety/judgement;Awareness;Problem solving         Safety/Judgement: Decreased awareness of safety Awareness: Intellectual Problem Solving: Difficulty sequencing;Requires verbal cues;Requires tactile cues General Comments: poor safety awareness overall.  Pt states she wants to go home without O2 and is ready to die and would rather die than wear oxygen at home.  She also states she will not quit smoking.      General Comments General comments (skin integrity, edema, etc.): Pt was soaked in urine in the bed and asked PT to "just leave it because I am just going to pee again". Educated pt importance of getting cleaned up to prevent skin breakdown, so after max encouragement, pt agreed to get OOB and get cleaned up.     Exercises        Assessment/Plan    PT Assessment Patient needs continued PT services  PT Diagnosis Difficulty walking;Generalized weakness   PT Problem List Decreased strength;Decreased balance;Decreased safety awareness;Decreased knowledge of use of DME;Cardiopulmonary status limiting activity;Decreased activity tolerance  PT Treatment Interventions DME instruction;Gait training;Stair training;Functional mobility training;Therapeutic activities;Balance training;Therapeutic exercise;Patient/family education   PT Goals (Current goals can be found in the Care Plan section) Acute Rehab PT Goals Patient Stated  Goal: "I want to go home tomorrow so I can smoke" PT Goal Formulation: With patient Time For Goal Achievement: 09/03/15 Potential to Achieve Goals: Fair    Frequency Min 3X/week   Barriers to discharge Decreased caregiver support Daughter and granddaughter work during the day so pt would be by herself during the day.     Co-evaluation               End of Session Equipment Utilized During Treatment: Gait belt;Oxygen Activity Tolerance: Treatment limited secondary to agitation (would only do minimal activity. ) Patient left: in chair;with call bell/phone within reach;with chair alarm set Nurse Communication: Mobility status         Time: [id number] PT Time Calculation (min) (ACUTE ONLY): 23 min   Charges:   PT Evaluation $PT Eval Moderate Complexity: 1 Procedure PT Treatments $Therapeutic Activity: 8-22 mins   PT G Codes:       Colon Branch, SPT Colon Branch 08/20/2015, 12:33 PM

## 2015-08-20 NOTE — Progress Notes (Signed)
RT attempted to show patient flutter valve. Patient sleeping. Rt will attempt again.

## 2015-08-21 ENCOUNTER — Inpatient Hospital Stay (HOSPITAL_COMMUNITY): Payer: PPO

## 2015-08-21 DIAGNOSIS — R079 Chest pain, unspecified: Secondary | ICD-10-CM

## 2015-08-21 LAB — BASIC METABOLIC PANEL
Anion gap: 12 (ref 5–15)
BUN: 25 mg/dL — AB (ref 6–20)
CO2: 29 mmol/L (ref 22–32)
CREATININE: 1.01 mg/dL — AB (ref 0.44–1.00)
Calcium: 8.4 mg/dL — ABNORMAL LOW (ref 8.9–10.3)
Chloride: 101 mmol/L (ref 101–111)
GFR calc Af Amer: 59 mL/min — ABNORMAL LOW (ref >60)
GFR, EST NON AFRICAN AMERICAN: 51 mL/min — AB (ref >60)
Glucose, Bld: 164 mg/dL — ABNORMAL HIGH (ref 65–99)
Potassium: 3.4 mmol/L — ABNORMAL LOW (ref 3.5–5.1)
SODIUM: 142 mmol/L (ref 135–145)

## 2015-08-21 LAB — RESPIRATORY VIRUS PANEL
Adenovirus: NEGATIVE
Influenza A: NEGATIVE
Influenza B: NEGATIVE
METAPNEUMOVIRUS: POSITIVE — AB
PARAINFLUENZA 1 A: NEGATIVE
PARAINFLUENZA 2 A: NEGATIVE
PARAINFLUENZA 3 A: NEGATIVE
RHINOVIRUS: NEGATIVE
Respiratory Syncytial Virus A: NEGATIVE
Respiratory Syncytial Virus B: NEGATIVE

## 2015-08-21 LAB — HEMOGLOBIN A1C
Hgb A1c MFr Bld: 6.3 % — ABNORMAL HIGH (ref 4.8–5.6)
MEAN PLASMA GLUCOSE: 134 mg/dL

## 2015-08-21 LAB — CBC
HCT: 35 % — ABNORMAL LOW (ref 36.0–46.0)
Hemoglobin: 11.1 g/dL — ABNORMAL LOW (ref 12.0–15.0)
MCH: 31.3 pg (ref 26.0–34.0)
MCHC: 31.7 g/dL (ref 30.0–36.0)
MCV: 98.6 fL (ref 78.0–100.0)
PLATELETS: 253 10*3/uL (ref 150–400)
RBC: 3.55 MIL/uL — AB (ref 3.87–5.11)
RDW: 15.5 % (ref 11.5–15.5)
WBC: 16.4 10*3/uL — ABNORMAL HIGH (ref 4.0–10.5)

## 2015-08-21 LAB — ECHOCARDIOGRAM COMPLETE
Height: 62 in
Weight: 1776 oz

## 2015-08-21 LAB — GLUCOSE, CAPILLARY
GLUCOSE-CAPILLARY: 156 mg/dL — AB (ref 65–99)
GLUCOSE-CAPILLARY: 195 mg/dL — AB (ref 65–99)
Glucose-Capillary: 153 mg/dL — ABNORMAL HIGH (ref 65–99)
Glucose-Capillary: 153 mg/dL — ABNORMAL HIGH (ref 65–99)

## 2015-08-21 LAB — STREP PNEUMONIAE URINARY ANTIGEN: Strep Pneumo Urinary Antigen: NEGATIVE

## 2015-08-21 NOTE — Progress Notes (Signed)
SATURATION QUALIFICATIONS: (This note is used to comply with regulatory documentation for home oxygen)  Patient Saturations on Room Air at Rest = 82- 90%  Patient Saturations on Room Air while Ambulating = 74%  Patient Saturations on 4 Liters of oxygen while Ambulating = 91%  Please briefly explain why patient needs home oxygen: Pt is unable to maintain SpO2 greater than 90% on room air and becomes very short of breath.   Colon Branch, SPT

## 2015-08-21 NOTE — Progress Notes (Signed)
  Echocardiogram 2D Echocardiogram has been performed.  Sarah [name] 08/21/2015, 5:08 PM

## 2015-08-21 NOTE — Progress Notes (Signed)
Rt instructed patient and family on use of flutter valve. Patient is able to use with good technique. RT will continue to monitor.

## 2015-08-21 NOTE — Evaluation (Signed)
Occupational Therapy Evaluation Patient Details Name: Sarah [name] MRN: [id number] DOB: Aug 29, 1931 Today's Date: 08/21/2015    History of Present Illness Sarah [name] is a 82 y.o. female who presents to the Emergency Department with a PMHx of HTN, DM, atrial fibrillation, and COPD complaining of SOB with an associated cough, PTA. Daughter states she has been hearing a change in the patient breath sounds for the past several weeks. She has been trying to get the pt to see a Pulmonologist for quite sometime and finally made an appointment to see them tomorrow. However, tonight, the pts breathing worsened so the daughter brought the pt to the ED for evaluation.    Clinical Impression   Pt could benefit from SNF level care upon d/c due to balance cognitive and respiratory deficits. Pt likely to refuse and only concerned with return to home to be with cats. No family present at this time to determine true family (A) upon d/c. Pt does have x2 small grandchildren in the home ( 79 yo and 68 yo) that granddaughter must take care of in addition to patient.     Follow Up Recommendations  SNF;Other (comment);Supervision/Assistance - 24 hour (pt declines and wants to go home- HHOT recommended)    Equipment Recommendations  3 in 1 bedside comode;Other (comment) (RW and oxygen)    Recommendations for Other Services       Precautions / Restrictions Precautions Precautions: Fall Precaution Comments: oxygen needs      Mobility Bed Mobility Overal bed mobility: Needs Assistance Bed Mobility: Supine to Sit     Supine to sit: Mod assist;HOB elevated     General bed mobility comments: Pt able to bring her legs off the bed and scoot around but needed Mod A to bring her trunk upright.   Transfers Overall transfer level: Needs assistance Equipment used: 2 person hand held assist Transfers: Sit to/from Stand Sit to Stand: Min assist Stand pivot transfers: Min assist       General  transfer comment: Pt needed Min A for balance to stand up and to transfer to the bedside commode.     Balance   Sitting-balance support: Bilateral upper extremity supported;Feet supported Sitting balance-Leahy Scale: Fair     Standing balance support: Single extremity supported;During functional activity Standing balance-Leahy Scale: Poor                              ADL Overall ADL's : Needs assistance/impaired Eating/Feeding: Set up;Sitting Eating/Feeding Details (indicate cue type and reason): pt using water bottle from hoem with a straw. pt demonstrates decr breath support to suck drink up the straw. pt states "i can't get it" pt tipping the container instead of using the straw properly Grooming: Wash/dry hands;Min guard;Sitting               Lower Body Dressing: Sit to/from stand;Minimal assistance Lower Body Dressing Details (indicate cue type and reason): (A) to don mesh panties. Completed in seated position Toilet Transfer: Stand-pivot;RW;Minimal assistance;+2 for physical assistance Toilet Transfer Details (indicate cue type and reason): pt needed (A) for balance adn controlled descend to surface. Pt with urgency to reach 3n1 Toileting- Clothing Manipulation and Hygiene: Moderate assistance;Sit to/from stand Toileting - Clothing Manipulation Details (indicate cue type and reason): needed (A) for Sarah care. pt reports "i can't     Functional mobility during ADLs: Moderate assistance;Rolling walker General ADL Comments: Pt insist on going home to smoke  and be with cats. pt reports "dont laugh I talk to god every      Vision     Perception     Praxis      Pertinent Vitals/Pain       Hand Dominance Right   Extremity/Trunk Assessment Upper Extremity Assessment Upper Extremity Assessment: Generalized weakness   Lower Extremity Assessment Lower Extremity Assessment: Defer to PT evaluation   Cervical / Trunk Assessment Cervical / Trunk Assessment:  Kyphotic   Communication Communication Communication: No difficulties   Cognition Arousal/Alertness: Awake/alert Behavior During Therapy: Flat affect;Anxious Overall Cognitive Status: Impaired/Different from baseline Area of Impairment: Safety/judgement;Awareness;Problem solving     Memory: Decreased recall of precautions   Safety/Judgement: Decreased awareness of deficits;Decreased awareness of safety Awareness: Intellectual Problem Solving: Slow processing;Difficulty sequencing General Comments: Pt demonstrates decr sequence with Sarah hygiene during toilet transfers. pt decr awareness to oxygen deficits. pt reports being able to complete all task MOD I upon d/c despite x2 staff members helping patient today   General Comments       Exercises       Shoulder Instructions      Home Living Family/patient expects to be discharged to:: Private residence Living Arrangements: Children;Other relatives Available Help at Discharge: Family;Available PRN/intermittently Type of Home: House Home Access: Stairs to enter [employer] of Steps: 3 Entrance Stairs-Rails: Right;Left;Can reach both Home Layout: Two level;1/2 bath on main level     Bathroom Shower/Tub: Tub/shower unit   Bathroom Toilet: Standard Bathroom Accessibility: Yes   Home Equipment: None   Additional Comments: reports granddaughter in 16s home during the day and works Friday Saturday and sunday only. Daughter Sarah [name] works for [employer] and works Monday - Friday. Pt reports all transfers MOD I and smokes outside the house only.       Prior Functioning/Environment Level of Independence: Independent        Comments: pt with frequent falls per chart    OT Diagnosis: Generalized weakness;Cognitive deficits   OT Problem List: Decreased strength;Decreased activity tolerance;Impaired balance (sitting and/or standing);Decreased cognition;Decreased safety awareness;Decreased knowledge of use of DME or  AE;Decreased knowledge of precautions;Cardiopulmonary status limiting activity   OT Treatment/Interventions: Self-care/ADL training;Therapeutic exercise;DME and/or AE instruction;Energy conservation;Therapeutic activities;Cognitive remediation/compensation;Patient/family education;Balance training    OT Goals(Current goals can be found in the care plan section) Acute Rehab OT Goals Patient Stated Goal: "i want to go home to my cats" OT Goal Formulation: With patient Potential to Achieve Goals: Good  OT Frequency: Min 2X/week   Barriers to D/C:            Co-evaluation PT/OT/SLP Co-Evaluation/Treatment: Yes Reason for Co-Treatment: Complexity of the patient's impairments (multi-system involvement);For patient/therapist safety   OT goals addressed during session: ADL's and self-care;Strengthening/ROM      End of Session Equipment Utilized During Treatment: Gait belt;Rolling walker;Oxygen Nurse Communication: Mobility status;Precautions  Activity Tolerance: Patient limited by fatigue Patient left: in chair;with call bell/phone within reach;with chair alarm set   Time:  -    Charges:    G-Codes:    Sarah [name] 09/09/2015, 3:44 PM   Jeri Modena   OTR/L Pager: [phone] Office: [phone] .

## 2015-08-21 NOTE — Progress Notes (Signed)
OT NOTE  Pt agreeable to have oxygen in the house at this time and use of RW. Pt educated on safety with smoking and oxygen. Pt reports "i am not going to quit smoking but we do not smoke in the house. Only in the garage." Pt reinforced throughout session that smoking can not be near any oxygen equipment. Pt states she is willing to try oxygen in the home at this time. Pt also reports taking sleeping pills at night. Question oxygen saturation at night.   Pt placed on RA this session to help bring awareness to patient. Pt desaturation on RA 74% after 8 ft and decr awareness.   Jeri Modena   OTR/L Pager: [phone] Office: [phone] .

## 2015-08-21 NOTE — Progress Notes (Addendum)
[facility]                                                                              Patient Demographics  Sarah [name], is a 82 y.o. female, DOB - 1931-05-22, [id number]  Admit date - 08/19/2015   Admitting Physician Ivor Costa, MD  Outpatient Primary MD for the patient is Nance Pear., NP  Outpatient specialists: Dr. Rosina Lowenstein, pulmonologist  LOS - 2  days    Chief Complaint  Patient presents with  . Shortness of Breath       Brief HPI   Patient is a 82 y.o. female with hypertension, hyperlipidemia, diabetes mellitus, COPD, depression, tobacco abuse, OSA, atrial fibrillation on Eliquis, combined systolic and diastolic CHF with EF [id number], LBBB, pancreas divisum, who presented with cough and shortness of breath. The patient reported progressively worsening dyspnea, cough in the last few days with no fever, chills, sore throat or chest pain. In ED, patient was found to have elevated troponin 0.06, BNP 488.3, WBC 9.5, temperature normal, no tachycardia, has tachypnea, creatinine 1.16. Chest x-ray showed fibrotic change throughout the lungs bilaterally, most severe in the bases bilaterally, a stable finding. No frank edema or consolidation.  EKG: Independently reviewed. QTC 484, sinus rhythm, left bundle blockage, first degree AV block    Assessment & Plan    Principal Problem:   Acute on chronic respiratory failure with hypoxia (HCC)Likely due to underlying fibrosis, Moderate to severe emphysema/COPD exacerbation, Ongoing smoking- slightly better today - Patient diffusely wheezing, audibly at the time of my examination, on NRB  - Solu-Medrol 125 mg IV x1, then 60 mg q6hrs, IV Rocephin, IV Zithromax, Brovana, Pulmicort, flutter valve  - wean O2 as tolerated  - BNP 488, follow 2-D echo, continue Lasix - Blood cultures negative so far, flu negative  - Patient has no intention of quitting smoking - Noncompliant, Overall poor prognosis,  discussed in detail with patient's daughter, she is interested in hospice if the patient does not improve over the next few days  Chronic combined systolic and diastolic CHF (congestive heart failure) ([place]): 2-D echo on 06/06/14 showed EF of 40%. CHF is compensated. -continue Lasix and metoprolol  Elevated troponin: Possibly due to acute respiratory failure and demand ischemia  - Patient is not on eliquis (confirmed with daughter), placed on aspirin 81 mg daily - Follow 2-D echocardiogram  HTN: -on lasix and metoprolol  Atrial Fibrillation: CHA2DS2-VASc Score is 6, needs oral anticoagulation. Patient was on Eliquis at home.  -Continue amiodarone, metoprolol for rate control - Patient's daughter confirmed that patient is not longer taking eliquis due to recent falls  DM-II: Last A1c 5.9 on 10/14/14 -SSI, follow closely with steroids on board -Check A1c  HLD: Last LDL was 99 on 07/08/15 -Continue home medications: Lipitor -Lipid panel with LDL 74  Tobacco abuse -Smoking cessation counseling provided, patient has no intention of quitting smoking -Nicotine patch  Depression and anxiety: Stable, no suicidal or homicidal ideations. -Continue home medications: Lexapro and hydroxyzine   Code Status: DO NOT RESUSCITATE  Family Communication: Discussed in detail with the patient, all imaging results, lab results explained to the  patient. Called patient's daughter on the phone, discussed in detail.   Disposition Plan:  patient continues to ask to go home, however she has acute hypoxic respiratory failure, on NRB, not medically stable for discharge. Per daughter, patient will need to stay in the hospital until she is medically stable  Time Spent in minutes   82minutes  Procedures  None  Consults   None  DVT Prophylaxis Place on heparin subcutaneous  Medications  Scheduled Meds: . amiodarone  400 mg Oral Daily  . arformoterol  15 mcg Nebulization BID  . aspirin EC  81 mg Oral Daily    . atorvastatin  40 mg Oral q1800  . azithromycin  500 mg Intravenous Q24H  . budesonide (PULMICORT) nebulizer solution  0.25 mg Nebulization BID  . calcium-vitamin D  1 tablet Oral BID  . cefTRIAXone (ROCEPHIN)  IV  1 g Intravenous Q24H  . cholecalciferol  1,000 Units Oral Daily  . dextromethorphan-guaiFENesin  1 tablet Oral BID  . donepezil  5 mg Oral QHS  . escitalopram  10 mg Oral Daily  . furosemide  40 mg Oral BID  . heparin subcutaneous  5,000 Units Subcutaneous Q8H  . insulin aspart  0-5 Units Subcutaneous QHS  . insulin aspart  0-9 Units Subcutaneous TID WC  . ipratropium-albuterol  3 mL Nebulization QID  . methylPREDNISolone (SOLU-MEDROL) injection  60 mg Intravenous Q6H  . metoprolol  25 mg Oral Daily  . nicotine  21 mg Transdermal Daily  . polycarbophil  625 mg Oral Daily   Continuous Infusions:  PRN Meds:.albuterol, hydrOXYzine, lidocaine, LORazepam, polyethylene glycol   Antibiotics   Anti-infectives    Start     Dose/Rate Route Frequency Ordered Stop   08/21/15 2100  azithromycin (ZITHROMAX) tablet 250 mg  Status:  Discontinued     250 mg Oral Daily 08/20/15 0316 08/20/15 0728   08/21/15 2000  levofloxacin (LEVAQUIN) IVPB 750 mg  Status:  Discontinued     750 mg 100 mL/hr over 90 Minutes Intravenous Every 48 hours 08/20/15 0227 08/20/15 0316   08/20/15 2100  azithromycin (ZITHROMAX) tablet 500 mg  Status:  Discontinued     500 mg Oral Daily 08/20/15 0316 08/20/15 0728   08/20/15 2000  azithromycin (ZITHROMAX) 500 mg in dextrose 5 % 250 mL IVPB     500 mg 250 mL/hr over 60 Minutes Intravenous Every 24 hours 08/20/15 0728     08/20/15 2000  cefTRIAXone (ROCEPHIN) 1 g in dextrose 5 % 50 mL IVPB     1 g 100 mL/hr over 30 Minutes Intravenous Every 24 hours 08/20/15 0728     08/19/15 2115  levofloxacin (LEVAQUIN) IVPB 750 mg  Status:  Discontinued     750 mg 100 mL/hr over 90 Minutes Intravenous Every 24 hours 08/19/15 2105 08/20/15 0227        Subjective:    [name] was seen and examined today.Somewhat better however still wheezing diffusely. Off NRB.  Continue to ask to go home. Has shortness of breath but no fevers or chills or any chest pain at this time. Patient denies dizziness, chest pain,  abdominal pain, N/V/D/C, new weakness, numbess, tingling. No acute events overnight.    Objective:   Filed Vitals:   08/21/15 0400 08/21/15 0759 08/21/15 0852 08/21/15 0933  BP: 115/55 106/45 99/52 106/45  Pulse: 64 54 66 77  Temp: 97.7 F (36.5 C)  98.6 F (37 C)   TempSrc: Oral  Oral   Resp: 22 21 26  Height:      Weight:      SpO2: 99% 98%      Intake/Output Summary (Last 24 hours) at 08/21/15 0936 Last data filed at 08/21/15 0425  Gross per 24 hour  Intake    600 ml  Output   1525 ml  Net   -925 ml     Wt Readings from Last 3 Encounters:  08/19/15 50.349 kg (111 lb)  07/08/15 50.531 kg (111 lb 6.4 oz)  07/03/15 50.349 kg (111 lb)     Exam  General: Alert and oriented x 3, NAD  HEENT:    Neck: Supple, no JVD  CVS: S1 S2 clear, RRR  Respiratory: Diffuse wheezing bilaterally   Abdomen: Soft, nontender, nondistended, + bowel sounds  Ext: no cyanosis clubbing or edema  Neuro: no new deficits  Skin: No rashes  Psych: Normal affect and demeanor, alert and oriented x3    Data Reviewed:  I have personally reviewed following labs and imaging studies  Micro Results Recent Results (from the past 240 hour(s))  MRSA PCR Screening     Status: None   Collection Time: 08/20/15  2:38 AM  Result Value Ref Range Status   MRSA by PCR NEGATIVE NEGATIVE Final    Comment:        The GeneXpert MRSA Assay (FDA approved for NASAL specimens only), is one component of a comprehensive MRSA colonization surveillance program. It is not intended to diagnose MRSA infection nor to guide or monitor treatment for MRSA infections.     Radiology Reports Dg Chest Portable 1 View  08/19/2015  CLINICAL DATA:  Respiratory  distress EXAM: PORTABLE CHEST 1 VIEW COMPARISON:  July 08, 2015 FINDINGS: Lungs are somewhat hyperexpanded. There is persistent fibrotic change throughout the lungs, most severely in the basilar regions bilaterally. There is no frank edema or consolidation. Heart is upper normal in size with pulmonary vascularity within normal limits. There is atherosclerotic calcification in the aorta. No adenopathy. There is extensive arthropathy in both shoulders, more severe on the left than on the right. IMPRESSION: Fibrotic change throughout the lungs bilaterally, most severe in the bases bilaterally, a stable finding. No frank edema or consolidation. Stable cardiac silhouette. Electronically Signed   By: Lowella Grip III M.D.   On: 08/19/2015 21:14    CBC  Recent Labs Lab 08/19/15 2100 08/21/15 0410  WBC 9.5 16.4*  HGB 13.1 11.1*  HCT 40.2 35.0*  PLT 272 253  MCV 99.5 98.6  MCH 32.4 31.3  MCHC 32.6 31.7  RDW 15.7* 15.5    Chemistries   Recent Labs Lab 08/19/15 2100 08/21/15 0410  NA 140 142  K 4.1 3.4*  CL 105 101  CO2 24 29  GLUCOSE 246* 164*  BUN 22* 25*  CREATININE 1.16* 1.01*  CALCIUM 9.1 8.4*   ------------------------------------------------------------------------------------------------------------------ estimated creatinine clearance is 34.6 mL/min (by C-G formula based on Cr of 1.01). ------------------------------------------------------------------------------------------------------------------  Recent Labs  08/20/15 0358  HGBA1C 6.3*   ------------------------------------------------------------------------------------------------------------------  Recent Labs  08/20/15 0358  CHOL 149  HDL 59  LDLCALC 74  TRIG 80  CHOLHDL 2.5   ------------------------------------------------------------------------------------------------------------------ No results for input(s): TSH, T4TOTAL, T3FREE, THYROIDAB in the last 72 hours.  Invalid input(s):  FREET3 ------------------------------------------------------------------------------------------------------------------ No results for input(s): VITAMINB12, FOLATE, FERRITIN, TIBC, IRON, RETICCTPCT in the last 72 hours.  Coagulation profile  Recent Labs Lab 08/20/15 0358  INR 1.25    No results for input(s): DDIMER in the last 72 hours.  Cardiac Enzymes  Recent Labs Lab 08/20/15 0318 08/20/15 0949 08/20/15 1528  TROPONINI 0.06* 0.04* 0.04*   ------------------------------------------------------------------------------------------------------------------ Invalid input(s): POCBNP   Recent Labs  08/20/15 0348 08/20/15 0911 08/20/15 1206 08/20/15 1628 08/20/15 2133 08/21/15 0737  GLUCAP 214* 156* 137* 155* 167* 156*     Meekah Math M.D. [facility] 08/21/2015, 9:36 AM  Pager: [phone] Between 7am to 7pm - call Pager - [phone]  After 7pm go to [email or web address] - password [email or web address]  Call night coverage person covering after 7pm

## 2015-08-21 NOTE — Progress Notes (Signed)
Physical Therapy Treatment Patient Details Name: Sarah [name] MRN: [id number] DOB: 10-Mar-1932 Today's Date: 08/21/2015    History of Present Illness Sarah [name] is a 82 y.o. female who presents to the Emergency Department with a PMHx of HTN, DM, atrial fibrillation, and COPD complaining of SOB with an associated cough, PTA. Daughter states she has been hearing a change in the patient breath sounds for the past several weeks. She has been trying to get the pt to see a Pulmonologist for quite sometime and finally made an appointment to see them tomorrow. However, tonight, the pts breathing worsened so the daughter brought the pt to the ED for evaluation.     PT Comments    Pt did better with therapy today. She is still very self limiting and will only do the minimum amount of activity. Pt's sats decreased to 74% with ambulation on room air and it took about ~3 minutes to recover on 4L [place]. Pt is very deconditioned and has very poor balance, so discussed pt using a rollator at home and she agreed that was a good idea. Pt is still very adamant about going home to her cats, but clarified that either her granddaughter or daughter is there to help her most of the time. Pt also agreed to use home oxygen which she has been very resistant to up until this point. She states that since she does not smoke in the house she will agree to wear it while in the house to ease her work of breathing, but she states that she will absolutely not stop smoking. Pt continues to benefit from PT services to decrease fall risk and work towards functional independence.    Follow Up Recommendations  Supervision/Assistance - 24 hour;Home health PT     Equipment Recommendations  3in1 (PT);Other (comment) (Rollator)    Recommendations for Other Services       Precautions / Restrictions Precautions Precautions: Fall Precaution Comments: oxygen needs Restrictions Weight Bearing Restrictions: No    Mobility  Bed Mobility Overal bed mobility: Needs Assistance Bed Mobility: Supine to Sit     Supine to sit: Mod assist;HOB elevated     General bed mobility comments: Pt able to bring her legs off the bed and scoot around but needed Mod A to bring her trunk upright.   Transfers Overall transfer level: Needs assistance Equipment used: 2 person hand held assist Transfers: Sit to/from [facility] Sit to Stand: Min assist Stand pivot transfers: Min assist       General transfer comment: Pt needed Min A for balance to stand up and to transfer to the bedside commode.   Ambulation/Gait Ambulation/Gait assistance: Min guard Ambulation Distance (Feet): 40 Feet (15+25 with sitting rest break) Assistive device: Rolling walker (2 wheeled) Gait Pattern/deviations: Step-through pattern;Decreased stride length;Shuffle;Trunk flexed;Narrow base of support Gait velocity: decreased Gait velocity interpretation: Below normal speed for age/gender General Gait Details: Pt walked to door and stated she could not walk anymore, but PT encouraged her to walk more so pt walked 25 more feet. Pt has very unsteady gait and will need stand by assist upon returning home for safety. Discussed getting walker with a seat (rollator) for her at home so she could sit when she got SOB and she agreed.    Stairs            Wheelchair Mobility    Modified Rankin (Stroke Patients Only)       Balance Overall balance assessment: Needs assistance Sitting-balance  support: Bilateral upper extremity supported;Feet supported Sitting balance-Leahy Scale: Fair     Standing balance support: Single extremity supported;During functional activity Standing balance-Leahy Scale: Poor Standing balance comment: Pt needed Min Guard for safety to reach back and wipe her bottom after toileting.                     Cognition Arousal/Alertness: Awake/alert Behavior During Therapy: Flat affect;Anxious Overall  Cognitive Status: Impaired/Different from baseline Area of Impairment: Safety/judgement;Awareness;Problem solving     Memory: Decreased recall of precautions   Safety/Judgement: Decreased awareness of deficits;Decreased awareness of safety Awareness: Intellectual Problem Solving: Slow processing;Difficulty sequencing General Comments: Pt demonstrates decr sequence with peri hygiene during toilet transfers. pt decr awareness to oxygen deficits. pt reports being able to complete all task MOD I upon d/c despite x2 staff members helping patient today    Exercises      General Comments General comments (skin integrity, edema, etc.): Pt is very set on going home to her cats. She finally agreed to wear oxygen at home since she does not smoke in the house. She agrees that she needs it, but still refuses to quit smoking. Educated pt on the importance of taking the oxygen off if she makes the decision to smoke. She stated "I want to die happy, and cigarettes make me happy, I will not stop smoking".       Pertinent Vitals/Pain Pain Assessment: No/denies pain  Patient Saturations on Room Air at Rest = 82- 90% Patient Saturations on Room Air while Ambulating = 74% Patient Saturations on 4 Liters of oxygen while Ambulating = 91%    Home Living Family/patient expects to be discharged to:: Private residence Living Arrangements: Children;Other relatives Available Help at Discharge: Family;Available PRN/intermittently Type of Home: House Home Access: Stairs to enter Entrance Stairs-Rails: Right;Left;Can reach both Home Layout: Two level;1/2 bath on main level Home Equipment: None Additional Comments: reports granddaughter in 17s home during the day and works Friday Saturday and sunday only. Daughter [name] works for [employer] and works Monday - Friday. Pt reports all transfers MOD I and smokes outside the house only.     Prior Function Level of Independence: Independent      Comments: pt with frequent  falls per chart   PT Goals (current goals can now be found in the care plan section) Acute Rehab PT Goals Patient Stated Goal: "i want to go home to my cats" PT Goal Formulation: With patient Time For Goal Achievement: 09/03/15 Potential to Achieve Goals: Fair Progress towards PT goals: Progressing toward goals    Frequency  Min 3X/week    PT Plan Current plan remains appropriate    Co-evaluation PT/OT/SLP Co-Evaluation/Treatment: Yes Reason for Co-Treatment: Complexity of the patient's impairments (multi-system involvement) PT goals addressed during session: Mobility/safety with mobility;Proper use of DME;Balance;Strengthening/ROM       End of Session Equipment Utilized During Treatment: Gait belt;Oxygen Activity Tolerance: Patient tolerated treatment well Patient left: in chair;with call bell/phone within reach;with chair alarm set     Time: [id number] PT Time Calculation (min) (ACUTE ONLY): 41 min  Charges:  $Therapeutic Activity: 23-37 mins                    G Codes:      Colon Branch, SPT Colon Branch 08/21/2015, 2:03 PM

## 2015-08-22 ENCOUNTER — Other Ambulatory Visit: Payer: Self-pay | Admitting: Family

## 2015-08-22 ENCOUNTER — Telehealth: Payer: Self-pay | Admitting: Family

## 2015-08-22 DIAGNOSIS — G4733 Obstructive sleep apnea (adult) (pediatric): Secondary | ICD-10-CM

## 2015-08-22 LAB — CBC
HEMATOCRIT: 34.4 % — AB (ref 36.0–46.0)
Hemoglobin: 11 g/dL — ABNORMAL LOW (ref 12.0–15.0)
MCH: 31.2 pg (ref 26.0–34.0)
MCHC: 32 g/dL (ref 30.0–36.0)
MCV: 97.5 fL (ref 78.0–100.0)
Platelets: 242 10*3/uL (ref 150–400)
RBC: 3.53 MIL/uL — AB (ref 3.87–5.11)
RDW: 15.3 % (ref 11.5–15.5)
WBC: 12.9 10*3/uL — AB (ref 4.0–10.5)

## 2015-08-22 LAB — GLUCOSE, CAPILLARY
GLUCOSE-CAPILLARY: 202 mg/dL — AB (ref 65–99)
Glucose-Capillary: 177 mg/dL — ABNORMAL HIGH (ref 65–99)
Glucose-Capillary: 178 mg/dL — ABNORMAL HIGH (ref 65–99)

## 2015-08-22 LAB — BASIC METABOLIC PANEL
ANION GAP: 12 (ref 5–15)
BUN: 31 mg/dL — ABNORMAL HIGH (ref 6–20)
CALCIUM: 8.3 mg/dL — AB (ref 8.9–10.3)
CO2: 32 mmol/L (ref 22–32)
Chloride: 98 mmol/L — ABNORMAL LOW (ref 101–111)
Creatinine, Ser: 0.98 mg/dL (ref 0.44–1.00)
GFR, EST NON AFRICAN AMERICAN: 53 mL/min — AB (ref >60)
Glucose, Bld: 160 mg/dL — ABNORMAL HIGH (ref 65–99)
POTASSIUM: 3.3 mmol/L — AB (ref 3.5–5.1)
Sodium: 142 mmol/L (ref 135–145)

## 2015-08-22 MED ORDER — POTASSIUM CHLORIDE CRYS ER 20 MEQ PO TBCR: 40.0000 meq | EXTENDED_RELEASE_TABLET | Freq: Once | ORAL | Status: DC

## 2015-08-22 MED ORDER — SENNOSIDES-DOCUSATE SODIUM 8.6-50 MG PO TABS
1.0000 | ORAL_TABLET | Freq: Every day | ORAL | Status: DC
  Administered 2015-08-22 – 2015-08-23 (×2): 1 via ORAL
  Filled 2015-08-22 (×2): qty 1

## 2015-08-22 MED ORDER — IPRATROPIUM-ALBUTEROL 0.5-2.5 (3) MG/3ML IN SOLN
3.0000 mL | Freq: Three times a day (TID) | RESPIRATORY_TRACT | Status: DC
  Administered 2015-08-22 – 2015-08-24 (×5): 3 mL via RESPIRATORY_TRACT
  Filled 2015-08-22 (×6): qty 3

## 2015-08-22 MED ORDER — OXYCODONE HCL 5 MG PO TABS: 2.5000 mg | ORAL_TABLET | ORAL | Status: DC | PRN

## 2015-08-22 MED ORDER — POTASSIUM CHLORIDE CRYS ER 20 MEQ PO TBCR
40.0000 meq | EXTENDED_RELEASE_TABLET | Freq: Every day | ORAL | Status: DC
  Administered 2015-08-22 – 2015-08-24 (×3): 40 meq via ORAL
  Filled 2015-08-22 (×3): qty 2

## 2015-08-22 MED ORDER — METHYLPREDNISOLONE SODIUM SUCC 125 MG IJ SOLR
60.0000 mg | Freq: Two times a day (BID) | INTRAMUSCULAR | Status: DC
  Administered 2015-08-22 – 2015-08-23 (×2): 60 mg via INTRAVENOUS
  Filled 2015-08-22 (×2): qty 2

## 2015-08-22 MED FILL — ALENDRONATE NA 70 MG TAB: 70 | 30 days supply | Qty: 4 | Fill #3

## 2015-08-22 NOTE — Progress Notes (Signed)
Patient is being transferred to 6E21.  Report given to [facility].  [name] was called and informed her of this transfer.

## 2015-08-22 NOTE — Progress Notes (Signed)
Occupational Therapy Treatment Patient Details Name: Sarah [name] MRN: [id number] DOB: Jan 04, 1932 Today's Date: 08/22/2015    History of present illness Sarah [name] is a 82 y.o. female who presents to the Emergency Department with a PMHx of HTN, DM, atrial fibrillation, and COPD complaining of SOB with an associated cough, PTA. Daughter states she has been hearing a change in the patient breath sounds for the past several weeks. She has been trying to get the pt to see a Pulmonologist for quite sometime and finally made an appointment to see them tomorrow. However, tonight, the pts breathing worsened so the daughter brought the pt to the ED for evaluation.    OT comments  Session limited by fatigue/poor endurance. Able to transfer to [facility] with min A. Pt declined to complete her own pericare. Pt sat in recliner approximately 20 seconds before requesting to get back into bed. Bed mobility with mod A. O2 Sats 85 during bed mobility on 2L. 3/4 dypsnea. Pt will need w/c for safe mobility.   Follow Up Recommendations  Supervision/Assistance - 24 hour    Equipment Recommendations  3 in 1 bedside comode;Hospital bed; w/c and w/c cushion   Recommendations for Other Services      Precautions / Restrictions Precautions Precautions: Fall Precaution Comments: oxygen needs       Mobility Bed Mobility Overal bed mobility: Needs Assistance Bed Mobility: Supine to Sit;Sit to Supine     Supine to sit: Min assist;HOB elevated Sit to supine: Min assist;HOB elevated      Transfers Overall transfer level: Needs assistance Equipment used: 1 person hand held assist Transfers: Sit to/from [facility] Sit to Stand: Min assist Stand pivot transfers: Min assist            Balance   Sitting-balance support: Bilateral upper extremity supported;Feet supported Sitting balance-Leahy Scale: Fair       Standing balance-Leahy Scale: Poor                      ADL Overall ADL's : Needs assistance/impaired                         Toilet Transfer: Minimal assistance;BSC;Cueing for safety;Stand-pivot   Toileting- Clothing Manipulation and Hygiene: Moderate assistance;Sit to/from stand Toileting - Clothing Manipulation Details (indicate cue type and reason): needed (A) for peri care. pt reports "i can't     Functional mobility during ADLs: Minimal assistance (only for stand pivot) General ADL Comments: Attempted mobility with pt however, pt continued to say, I just want to get back in bed. Asked pt to sit OOB in chair, Pt trnsferred to chair and in @ 20 seconds, pt asked to get back into bed.       Vision                     Perception     Praxis      Cognition   Behavior During Therapy: Flat affect;Anxious Overall Cognitive Status: Impaired/Different from baseline Area of Impairment: Safety/judgement;Awareness;Problem solving     Memory: Decreased recall of precautions    Safety/Judgement: Decreased awareness of deficits;Decreased awareness of safety Awareness: Intellectual Problem Solving: Slow processing;Difficulty sequencing      Extremity/Trunk Assessment               Exercises     Shoulder Instructions       General Comments      Pertinent Vitals/  Pain       Pain Assessment: 0-10 Pain Score: 7  Pain Location: R hip Pain Descriptors / Indicators: Aching Pain Intervention(s): Limited activity within patient's tolerance;Monitored during session;Repositioned;Patient requesting pain meds-RN notified  Home Living                                          Prior Functioning/Environment              Frequency Min 2X/week     Progress Toward Goals  OT Goals(current goals can now be found in the care plan section)  Progress towards OT goals: Not progressing toward goals - comment (Pt limited by endurance)  Acute Rehab OT Goals Patient Stated Goal: none stated OT  Goal Formulation: With patient Potential to Achieve Goals: Fair ADL Goals Pt Will Perform Grooming: with modified independence;sitting Pt Will Perform Upper Body Bathing: with modified independence;sitting Pt Will Perform Lower Body Bathing: with modified independence;sit to/from stand Pt Will Transfer to Toilet: with modified independence;ambulating;regular height toilet  Plan Discharge plan needs to be updated    Co-evaluation                 End of Session Equipment Utilized During Treatment: Oxygen (2L)   Activity Tolerance Patient limited by fatigue   Patient Left in bed;with call bell/phone within reach;with bed alarm set   Nurse Communication Mobility status        Time: [id number] OT Time Calculation (min): 19 min  Charges: OT General Charges $OT Visit: 1 Procedure OT Treatments $Self Care/Home Management : 8-22 mins  Tryphena Perkovich,HILLARY 08/22/2015, 12:34 PM   [facility], OTR/L  [phone] 08/22/2015

## 2015-08-22 NOTE — Telephone Encounter (Signed)
Caller name: Antonieta Iba or Manus Gunning  Relation to pt: RNs from [place]  Call back number: [phone]    Reason for call:  Patient will be discharged from [facility] and would like to know if [name] would be the attending Physician or would you like [place] to provide a physician. Please advise

## 2015-08-22 NOTE — Care Management Note (Addendum)
Case Management Note  Patient Details  Name: CARRIE PROTSMAN MRN: [id number] Date of Birth: Oct 29, 1931  Subjective/Objective:         CM following for progression and d/c planning.           Action/Plan: 08/22/2015 Noted consult for CM re home hospice services, this CM contacted pt daughter, [name] and she selected [facility] . The pt lives with Ms Purvis Kilts in [place] very near the Hospice facility. Currently they are interested in home hospice services. [facility] [place] contacted and they will call Ms Purvis Kilts to meet with pt and family and eval for services in the home. Info provided and the pt will be evaluated.  4pm Received a call from Hospice RN who has reviewed the medical record and spoke with the pt daughter, Ms Purvis Kilts who is unable to meet with hospice until next week. Her plan is to take the pt home with family providing care as previously and [facility] will come to the home early next week and meet with the pt and daughter to plan hospice servies.  Expected Discharge Date:                  Expected Discharge Plan:  Home w Hospice Care  In-House Referral:  NA  Discharge planning Services  CM Consult  Post Acute Care Choice:  Durable Medical Equipment, Home Health Choice offered to:     DME Arranged:    DME Agency:     HH Arranged:  RN, Nurse's Aide, Social Work [place]  Status of Service:  Completed, signed off  [employer] Given:    Date Medicare IM Given:    Medicare IM give by:    Date Additional Medicare IM Given:    Additional Medicare Important Message give by:     If discussed at [place] of Stay Meetings, dates discussed:    Additional Comments:  Adron Bene, RN 08/22/2015, 2:57 PM

## 2015-08-22 NOTE — Progress Notes (Signed)
[facility]                                                                              Patient Demographics  Sarah [name], is a 82 y.o. female, DOB - 04-23-1932, [id number]  Admit date - 08/19/2015   Admitting Physician Ivor Costa, MD  Outpatient Primary MD for the patient is Nance Pear., NP  Outpatient specialists: Dr. Rosina Lowenstein, pulmonologist  LOS - 3  days    Chief Complaint  Patient presents with  . Shortness of Breath       Brief Summary    Patient is a 82 y.o. female with Dementia, hypertension, hyperlipidemia, diabetes mellitus, COPD, depression, tobacco abuse, OSA, atrial fibrillation not on Eliquis anymore, combined systolic and diastolic CHF with EF [id number], LBBB, pancreas divisum, presented with cough, dyspnea and wheezing. Patient was admitted for acute hypoxic respiratory failure with COPD exacerbation.  Patient is progressively improving, initially was on NRB mask, now weaned down to 2 L, she will be transferred to [place] today. Palliative Medicine was consulted, as patient has advanced emphysema, noncompliant, active smoking, per daughter  interested in hospice    Assessment & Plan    Principal Problem:   Acute on chronic respiratory failure with hypoxia (HCC)Likely due to underlying fibrosis, Moderate to severe emphysema/COPD exacerbation, Ongoing smoking- better today - Taper IV Solu-Medrol, continue IV antibiotics, DuoNeb's Brovana, Pulmicort, flutter valve  -  Blood cultures negative so far, flu negative, urine strep antigen negative - Patient has no intention of quitting smoking - Noncompliant, Overall poor prognosis, discussed in detail with patient's daughter, she is interested in hospice at home. - Home O2 evaluation prior to DC  Chronic combined systolic and diastolic CHF (congestive heart failure) ([place]): 2-D echo on 06/06/14 showed EF of 40%. CHF is compensated. -continue Lasix and metoprolol - 2-D echo 4/19  showed EF of 0000000, grade 2 diastolic dysfunction, pulmonary hypertension moderate  Elevated troponin: Possibly due to acute respiratory failure and demand ischemia  - Patient is not on eliquis (confirmed with daughter), placed on aspirin 81 mg daily - 2-D echo showed preserved EF, no regional wall motion abnormalities  HTN: -on lasix and metoprolol  Atrial Fibrillation: CHA2DS2-VASc Score is 6, needs oral anticoagulation. Patient was on Eliquis at home.  -Continue amiodarone, metoprolol for rate control - Patient's daughter confirmed that patient is not longer taking eliquis due to recent falls  DM-II: Last A1c 5.9 on 10/14/14 -SSI, follow closely with steroids on board, tapering IV steroids today -Hemoglobin A1c 6.3   HLD: Last LDL was 99 on 07/08/15 -Continue home medications: Lipitor -Lipid panel with LDL 74  Tobacco abuse -Smoking cessation counseling provided, patient has no intention of quitting smoking -Nicotine patch  Depression and anxiety: Stable, no suicidal or homicidal ideations. -Continue home medications: Lexapro and hydroxyzine   Code Status: DO NOT RESUSCITATE   Family Communication: Discussed in detail with the patient, all imaging results, lab results explained to the patient. Discussed with daughter yesterday   Disposition Plan: Transfer to MedSurg, possible DC in a.m.  Time Spent in minutes   33minutes  Procedures  None  Consults  Palliative medicine DVT Prophylaxis Place on heparin subcutaneous  Medications  Scheduled Meds: . amiodarone  400 mg Oral Daily  . arformoterol  15 mcg Nebulization BID  . aspirin EC  81 mg Oral Daily  . atorvastatin  40 mg Oral q1800  . azithromycin  500 mg Intravenous Q24H  . budesonide (PULMICORT) nebulizer solution  0.25 mg Nebulization BID  . calcium-vitamin D  1 tablet Oral BID  . cefTRIAXone (ROCEPHIN)  IV  1 g Intravenous Q24H  . cholecalciferol  1,000 Units Oral Daily  . dextromethorphan-guaiFENesin  1  tablet Oral BID  . donepezil  5 mg Oral QHS  . escitalopram  10 mg Oral Daily  . furosemide  40 mg Oral BID  . heparin subcutaneous  5,000 Units Subcutaneous Q8H  . insulin aspart  0-5 Units Subcutaneous QHS  . insulin aspart  0-9 Units Subcutaneous TID WC  . ipratropium-albuterol  3 mL Nebulization TID  . methylPREDNISolone (SOLU-MEDROL) injection  60 mg Intravenous Q12H  . metoprolol  25 mg Oral Daily  . nicotine  21 mg Transdermal Daily  . polycarbophil  625 mg Oral Daily  . potassium chloride  40 mEq Oral Daily   Continuous Infusions:  PRN Meds:.albuterol, hydrOXYzine, lidocaine, LORazepam, polyethylene glycol   Antibiotics   Anti-infectives    Start     Dose/Rate Route Frequency Ordered Stop   08/21/15 2100  azithromycin (ZITHROMAX) tablet 250 mg  Status:  Discontinued     250 mg Oral Daily 08/20/15 0316 08/20/15 0728   08/21/15 2000  levofloxacin (LEVAQUIN) IVPB 750 mg  Status:  Discontinued     750 mg 100 mL/hr over 90 Minutes Intravenous Every 48 hours 08/20/15 0227 08/20/15 0316   08/20/15 2100  azithromycin (ZITHROMAX) tablet 500 mg  Status:  Discontinued     500 mg Oral Daily 08/20/15 0316 08/20/15 0728   08/20/15 2000  azithromycin (ZITHROMAX) 500 mg in dextrose 5 % 250 mL IVPB     500 mg 250 mL/hr over 60 Minutes Intravenous Every 24 hours 08/20/15 0728     08/20/15 2000  cefTRIAXone (ROCEPHIN) 1 g in dextrose 5 % 50 mL IVPB     1 g 100 mL/hr over 30 Minutes Intravenous Every 24 hours 08/20/15 0728     08/19/15 2115  levofloxacin (LEVAQUIN) IVPB 750 mg  Status:  Discontinued     750 mg 100 mL/hr over 90 Minutes Intravenous Every 24 hours 08/19/15 2105 08/20/15 0227        Subjective:   Sarah [name] was seen and examined today.A lot better today, wheezing has significantly improved, Off NRB. On O2 3 L, sats 100%. Continue to ask to go home, shortness of breath is improving. No fevers or chills. Patient denies dizziness, chest pain,  abdominal pain,  N/V/D/C, new weakness, numbess, tingling. No acute events overnight.    Objective:   Filed Vitals:   08/22/15 0438 08/22/15 0800 08/22/15 0810 08/22/15 0814  BP:  116/60    Pulse:  57    Temp: 98.3 F (36.8 C) 98.6 F (37 C)    TempSrc: Oral Oral    Resp:  21    Height:      Weight:      SpO2:  100% 100% 100%    Intake/Output Summary (Last 24 hours) at 08/22/15 1030 Last data filed at 08/22/15 0311  Gross per 24 hour  Intake    480 ml  Output   1250 ml  Net   -770  ml     Wt Readings from Last 3 Encounters:  08/19/15 50.349 kg (111 lb)  07/08/15 50.531 kg (111 lb 6.4 oz)  07/03/15 50.349 kg (111 lb)     Exam  General: Alert and oriented x 3, NAD  HEENT:    Neck: Supple, no JVD  CVS: S1 S2 clear, RRR  Respiratory:Wheezing is significant improved today  Abdomen: Soft, nontender, nondistended, + bowel sounds  Ext: no cyanosis clubbing or edema  Neuro: no new deficits  Skin: No rashes  Psych: Normal affect and demeanor, alert and oriented x3    Data Reviewed:  I have personally reviewed following labs and imaging studies  Micro Results Recent Results (from the past 240 hour(s))  MRSA PCR Screening     Status: None   Collection Time: 08/20/15  2:38 AM  Result Value Ref Range Status   MRSA by PCR NEGATIVE NEGATIVE Final    Comment:        The GeneXpert MRSA Assay (FDA approved for NASAL specimens only), is one component of a comprehensive MRSA colonization surveillance program. It is not intended to diagnose MRSA infection nor to guide or monitor treatment for MRSA infections.   Respiratory virus panel     Status: Abnormal   Collection Time: 08/20/15  3:55 AM  Result Value Ref Range Status   Source - RVPAN NASAL SWAB  Corrected   Respiratory Syncytial Virus A Negative Negative Final   Respiratory Syncytial Virus B Negative Negative Final   Influenza A Negative Negative Final   Influenza B Negative Negative Final   Parainfluenza 1 Negative  Negative Final   Parainfluenza 2 Negative Negative Final   Parainfluenza 3 Negative Negative Final   Metapneumovirus Positive (A) Negative Final    Comment:                   Client Requested Flag   Rhinovirus Negative Negative Final   Adenovirus Negative Negative Final    Comment: (NOTE) Performed At: [facility] [place], [place] [id number] Lindon Romp MD [id number]   Culture, blood (routine x 2) Call MD if unable to obtain prior to antibiotics being given     Status: None (Preliminary result)   Collection Time: 08/20/15  4:10 AM  Result Value Ref Range Status   Specimen Description BLOOD LEFT ARM  Final   Special Requests IN PEDIATRIC BOTTLE 3ML  Final   Culture NO GROWTH 1 DAY  Final   Report Status PENDING  Incomplete  Culture, blood (routine x 2) Call MD if unable to obtain prior to antibiotics being given     Status: None (Preliminary result)   Collection Time: 08/20/15  4:25 AM  Result Value Ref Range Status   Specimen Description BLOOD RIGHT HAND  Final   Special Requests IN PEDIATRIC BOTTLE 3ML  Final   Culture NO GROWTH 1 DAY  Final   Report Status PENDING  Incomplete    Radiology Reports Dg Chest Portable 1 View  08/19/2015  CLINICAL DATA:  Respiratory distress EXAM: PORTABLE CHEST 1 VIEW COMPARISON:  July 08, 2015 FINDINGS: Lungs are somewhat hyperexpanded. There is persistent fibrotic change throughout the lungs, most severely in the basilar regions bilaterally. There is no frank edema or consolidation. Heart is upper normal in size with pulmonary vascularity within normal limits. There is atherosclerotic calcification in the aorta. No adenopathy. There is extensive arthropathy in both shoulders, more severe on the left than on the right. IMPRESSION: Fibrotic  change throughout the lungs bilaterally, most severe in the bases bilaterally, a stable finding. No frank edema or consolidation. Stable cardiac silhouette. Electronically Signed   By:  Lowella Grip III M.D.   On: 08/19/2015 21:14    CBC  Recent Labs Lab 08/19/15 2100 08/21/15 0410 08/22/15 0346  WBC 9.5 16.4* 12.9*  HGB 13.1 11.1* 11.0*  HCT 40.2 35.0* 34.4*  PLT 272 253 242  MCV 99.5 98.6 97.5  MCH 32.4 31.3 31.2  MCHC 32.6 31.7 32.0  RDW 15.7* 15.5 15.3    Chemistries   Recent Labs Lab 08/19/15 2100 08/21/15 0410 08/22/15 0346  NA 140 142 142  K 4.1 3.4* 3.3*  CL 105 101 98*  CO2 24 29 32  GLUCOSE 246* 164* 160*  BUN 22* 25* 31*  CREATININE 1.16* 1.01* 0.98  CALCIUM 9.1 8.4* 8.3*   ------------------------------------------------------------------------------------------------------------------ estimated creatinine clearance is 35.6 mL/min (by C-G formula based on Cr of 0.98). ------------------------------------------------------------------------------------------------------------------  Recent Labs  08/20/15 0358  HGBA1C 6.3*   ------------------------------------------------------------------------------------------------------------------  Recent Labs  08/20/15 0358  CHOL 149  HDL 59  LDLCALC 74  TRIG 80  CHOLHDL 2.5   ------------------------------------------------------------------------------------------------------------------ No results for input(s): TSH, T4TOTAL, T3FREE, THYROIDAB in the last 72 hours.  Invalid input(s): FREET3 ------------------------------------------------------------------------------------------------------------------ No results for input(s): VITAMINB12, FOLATE, FERRITIN, TIBC, IRON, RETICCTPCT in the last 72 hours.  Coagulation profile  Recent Labs Lab 08/20/15 0358  INR 1.25    No results for input(s): DDIMER in the last 72 hours.  Cardiac Enzymes  Recent Labs Lab 08/20/15 0318 08/20/15 0949 08/20/15 1528  TROPONINI 0.06* 0.04* 0.04*   ------------------------------------------------------------------------------------------------------------------ Invalid input(s):  POCBNP   Recent Labs  08/20/15 1628 08/20/15 2133 08/21/15 0737 08/21/15 1250 08/21/15 1730 08/21/15 2153  GLUCAP 155* 167* 156* 153* 153* 195*     RAI,RIPUDEEP M.D. [facility] 08/22/2015, 10:30 AM  Pager: [phone] Between 7am to 7pm - call Pager - [phone]  After 7pm go to [email or web address] - password [email or web address]  Call night coverage person covering after 7pm

## 2015-08-22 NOTE — Consult Note (Signed)
[facility] referral from [facility] at [facility].  Reached out to pt's dtr, Sarah [name].  She is unable to meet liaison due to schedule conflicts tomorrow.  States she understands the Hospice benefit well as Hospice has taken care of other family in the past.  Sarah [name] states she will be prepared to receive pt back home when she is discharged but would like to defer meeting with Hospice until sometime next week.  Made a plan that [facility] would reach out to her early next week to see about setting up at time to meet pt/family at home to discuss services.  Please call [facility] if we can be of further assistance  [phone].  Thank you for the opportunity to serve pt and family.  Wynetta Fines, RN

## 2015-08-22 NOTE — Consult Note (Addendum)
Consultation Note Date: 08/22/2015   Patient Name: Sarah [name]  DOB: June 06, 1931  MRN: [id number]  Age / Sex: 82 y.o., female  PCP: Debbrah Alar, NP Referring Physician: Mendel Corning, MD  Reason for Consultation: Disposition, Establishing goals of care, Hospice Evaluation and Non pain symptom management  Clinical Assessment/Narrative:  Ms. Sarah [name] is an 82 yo female with PMHx of moderate centrilobular emphysema, ongoing tobacco abuse, h/o dysphagia with aspiration pneumonia, HTN, OSA, Chronic Atrial Fibrillation on ASA (not on Eliquis d/t falls), T2DM, LBBB, and mild dementia who was admitted on 4/17 with acute on chronic respiratory failure with hypoxia due COPD Exacerbation and underlying fibrosis and emphysema. Patient has been slow to improve despite solumedrol, azithromycin, ceftriaxone and Duoneb treatments. Plan is for patient to go home with home oxygen once she is medically stable. Palliative Care was consulted for Goals of Care and Hospice Education as patient's daughter is interested in Hospice if patient does not improve over the next few days.   [name] is a divorced mother of 2 who lives with her daughter, Sarah [name] [facility]), her granddaughter, her 2 grandchildren, and her 2 cats. [name] spends most of her day sitting in a chair, doing puzzles, watching tv, and playing with her cats. She is able to complete all of her ADLs independently. She is severely limited due to her COPD as ambulating short distances is difficult for her. However, she is able to climb a flight of stairs to bathe herself twice a day. She ambulates from her chair to the garage several times a day to smoke a cigarette. She gets enjoyment from her cats and her family. She desires greatly to go home. She was open about her faith- as she prays to God and Jesus every day and thanks them for giving her one more day.  When she passes, she is excited to see her mom, dad and her brother. She wishes greatly to visit her brother's grave in [place] as he passed away 20 years ago from complications from [employer]. We discussed her wishes when it came her time. She would want to die at home surrounded by her family and her cats. She confirmed her DNR status. She also would not want artifical feeding. Her main complaints are shortness of breath (which she states is better and at her baseline) and lower back pain. Her daughter Sarah [name] works at outpatient rehab. Her son Sarah [name] is also in the area. I talked at length with her daughter Sarah [name] on the phone. She confirmed that she is her HCPOA. She had many questions about Hospice. As we discussed what her mother expressed to me as above concerning her wishes, Sarah [name] agreed with all of them and confirmed that she knew that's what her mother wanted. She is open to Home with [facility] if her mother is eligible and understands it is typically for patient's with a prognosis of less than 6 months.   Patient emphasized her desires to go home tomorrow. She will continue to smoke. I am not convinced the patient will wear her home oxygen, but she does understand why it was prescribed for her.   Contacts/Participants in Discussion: Patient, Genaveve Ortez and Daughter Sarah [name] Primary Decision Maker: Sarah [name] Relationship to Patient : Daughter HCPOA: yes -Sarah [name]   SUMMARY OF RECOMMENDATIONS   Code Status/Advance Care Planning: DNR    Code Status Orders        Start     Ordered   08/20/15  0643  Do not attempt resuscitation (DNR)   Continuous    Question Answer Comment  In the event of cardiac or respiratory ARREST Do not call a "code blue"   In the event of cardiac or respiratory ARREST Do not perform Intubation, CPR, defibrillation or ACLS   In the event of cardiac or respiratory ARREST Use medication by any route, position, wound care, and other measures to  relive pain and suffering. May use oxygen, suction and manual treatment of airway obstruction as needed for comfort.      08/20/15 [id number]    Code Status History    Date Active Date Inactive Code Status Order ID Comments User Context   08/20/2015  3:20 AM 08/20/2015  6:42 AM Full Code [id number]  Ivor Costa, MD Inpatient   06/04/2014 12:51 AM 06/06/2014  5:20 PM Full Code [id number]  Stephani Police, MD Inpatient      Other Directives:None  Symptom Management:  Oxycodone 2.5 mg Q4H prn for pain and shortness of breath Senna /s daily to prevent constipation on opioids.  Palliative Prophylaxis:   Bowel Regimen   Additional Recommendations (Limitations, Scope, Preferences):  No Artificial Feeding  Psycho-social/Spiritual:  Support System: Strong Desire for further Chaplaincy support:no Additional Recommendations: Caregiving  Support/Resources and Education on Hospice  Prognosis: <6 months. I feel this patient meets criteria for Hospice at Home based on the facts that our patient has disabling dyspnea- poorly responsive to bronchodilators, decreased functional capacity, prgression disease as evidenced by recent hospital visits and respiratory failure, hypoxemia at rest 82% and desaturates to 74% while ambulating, and Cor Pulmonale with PA pressure of 61.   Patient's PPS level is estimated to be 40% given that she mainly sits throughout the day, unable to most activity due to extensive disease, intake is reduced to normal, and she suffers from some confusion from dementia.   Discharge Planning: Home with Hospice   Chief Complaint/ Primary Diagnoses: Present on Admission:  . COPD exacerbation ([place]) . A-fib ([place]) . Chronic combined systolic and diastolic CHF (congestive heart failure) ([place]) . Tobacco abuse . Elevated troponin . OSA (obstructive sleep apnea) . LBBB (left bundle branch block) . Hyperlipidemia . Essential hypertension . Acute on chronic respiratory failure with hypoxia  ([place])  I have reviewed the medical record, interviewed the patient and family, and examined the patient. The following aspects are pertinent.  Past Medical History  Diagnosis Date  . Hypertension   . Hyperlipidemia   . Sleep apnea   . Atrial fibrillation with RVR ([place])     a. 06/2014 admitted and failed dccv x 2, amio/eliquis started;  b. CHA2DS2VASc = 6 (eliquis).  . Chronic combined systolic and diastolic CHF (congestive heart failure) ([place])     a. 06/2014 TEE: EF 40-45%, no thrombus, mild MR.  . LBBB (left bundle branch block)   . Anemia   . Diabetes type 2, controlled ([place])   . Tobacco abuse   . Cardiomyopathy ([place])     a. 06/2014 TEE: EF 40-45%, no thrombus, mild MR.  Marland Kitchen COPD (chronic obstructive pulmonary disease) ([place])   . Elevated LFTs   . Pancreas divisum   . Aortic atherosclerosis ([place])   . Internal hemorrhoids   . Hyperplastic colon polyp    Social History   Social History  . Marital Status: Divorced    Spouse Name: N/A  . Number of Children: 2  . Years of Education: N/A   Occupational History  . retired    Social History  Main Topics  . Smoking status: Current Every Day Smoker -- 1.25 packs/day for 50 years    Types: Cigarettes  . Smokeless tobacco: Never Used  . Alcohol Use: No  . Drug Use: No  . Sexual Activity: Not Asked   Other Topics Concern  . None   Social History Narrative   Moved from [place], [place] 2009   Family History  Problem Relation Age of Onset  . Diabetes    . Hyperlipidemia Neg Hx   . Heart attack Neg Hx   . Hypertension Neg Hx   . Sudden death Neg Hx   . Colon cancer      grandson  . Diabetes Mother    Scheduled Meds: . amiodarone  400 mg Oral Daily  . arformoterol  15 mcg Nebulization BID  . aspirin EC  81 mg Oral Daily  . atorvastatin  40 mg Oral q1800  . azithromycin  500 mg Intravenous Q24H  . budesonide (PULMICORT) nebulizer solution  0.25 mg Nebulization BID  . calcium-vitamin D  1 tablet Oral BID  .  cefTRIAXone (ROCEPHIN)  IV  1 g Intravenous Q24H  . cholecalciferol  1,000 Units Oral Daily  . dextromethorphan-guaiFENesin  1 tablet Oral BID  . donepezil  5 mg Oral QHS  . escitalopram  10 mg Oral Daily  . furosemide  40 mg Oral BID  . heparin subcutaneous  5,000 Units Subcutaneous Q8H  . insulin aspart  0-5 Units Subcutaneous QHS  . insulin aspart  0-9 Units Subcutaneous TID WC  . ipratropium-albuterol  3 mL Nebulization TID  . methylPREDNISolone (SOLU-MEDROL) injection  60 mg Intravenous Q12H  . metoprolol  25 mg Oral Daily  . nicotine  21 mg Transdermal Daily  . polycarbophil  625 mg Oral Daily  . potassium chloride  40 mEq Oral Daily  . senna-docusate  1 tablet Oral QHS   Continuous Infusions:  PRN Meds:.albuterol, hydrOXYzine, lidocaine, LORazepam, oxyCODONE, polyethylene glycol Medications Prior to Admission:  Prior to Admission medications   Medication Sig Start Date End Date Taking? Authorizing Provider  alendronate (FOSAMAX) 70 MG tablet TAKE 1 TABLET BY MOUTH EVERY 7 DAYS. TAKE WITH A FULL GLASS OF WATER ON AN EMPTY STOMACH. 05/22/15  Yes Debbrah Alar, NP  amiodarone (PACERONE) 200 MG tablet TAKE TWO TABLETS BY MOUTH DAILY 07/17/15  Yes Almyra Deforest, PA  aspirin EC 81 MG tablet Take 81 mg by mouth daily.   Yes Historical Provider, MD  atorvastatin (LIPITOR) 40 MG tablet TAKE 1 TABLET (40 MG TOTAL) BY MOUTH DAILY. 04/02/15  Yes Debbrah Alar, NP  Black Cohosh 540 MG CAPS Take 540 mg by mouth daily.   Yes Historical Provider, MD  Calcium Carbonate-Vitamin D (CALTRATE 600+D PO) Take 1 tablet by mouth 2 (two) times daily.   Yes Historical Provider, MD  Cholecalciferol (VITAMIN D3) 2000 UNITS TABS Take 1 tablet by mouth daily.     Yes Historical Provider, MD  Docusate Sodium (DOC-Q-LACE PO) Take 1 tablet by mouth daily.    Yes Historical Provider, MD  escitalopram (LEXAPRO) 10 MG tablet Take 1 tablet (10 mg total) by mouth daily. Patient taking differently: Take 10 mg by  mouth at bedtime.  07/09/15  Yes Debbrah Alar, NP  furosemide (LASIX) 40 MG tablet TAKE 1 TABLET BY MOUTH TWICE DAILY 07/17/15  Yes Lelon Perla, MD  glucosamine-chondroitin 500-400 MG tablet Take 1 tablet by mouth 2 (two) times daily.   Yes Historical Provider, MD  hydrOXYzine (VISTARIL) 25  MG capsule TAKE 1 OR 2 CAPSULES BY MOUTH DAILY FOR ANXIETY 06/20/15  Yes Debbrah Alar, NP  lidocaine (LIDODERM) 5 % Place 1 patch onto the skin as needed. Remove & Discard patch within 12 hours or as directed by MD   Yes Historical Provider, MD  metoprolol (LOPRESSOR) 50 MG tablet Take 0.5 tablets (25 mg total) by mouth daily. 11/25/14  Yes Debbrah Alar, NP  polycarbophil (FIBERCON) 625 MG tablet Take 625 mg by mouth daily.     Yes Historical Provider, MD  polyethylene glycol (MIRALAX / GLYCOLAX) packet Take 17 g by mouth daily as needed.    Yes Historical Provider, MD  potassium chloride SA (K-DUR,KLOR-CON) 20 MEQ tablet TAKE 1 TABLET BY MOUTH DAILY. TAKE AT THE SAME TIME AS FUROSEMIDE 07/18/15  Yes Debbrah Alar, NP   Allergies  Allergen Reactions  . Morphine And Related Nausea And Vomiting    Review of Systems  General: Denies fever, chills, fatigue, change in appetite.  Respiratory: Admits to SOB, cough, DOE, and wheezing.   Cardiovascular: Denies chest pain and palpitations.  Gastrointestinal: Denies nausea, vomiting, abdominal pain. Musculoskeletal: Admits to low back pain. Denies myalgias.  Skin: Denies rash and wounds.  Neurological: Denies dizziness, headaches, lightheadedness   Physical Exam Filed Vitals:   08/22/15 0800 08/22/15 0810 08/22/15 0814 08/22/15 1226  BP: 116/60   120/68  Pulse: 57   57  Temp: 98.6 F (37 C)   98.7 F (37.1 C)  TempSrc: Oral   Oral  Resp: 21   20  Height:      Weight:      SpO2: 100% 100% 100% 97%   General: Vital signs reviewed.  Patient is elderly, thin, in no acute distress and cooperative with exam.  Head: Normocephalic and  atraumatic. Eyes: EOMI, conjunctivae normal, no scleral icterus.  Cardiovascular: Regular rhythm, bradycardic, S1 normal, S2 normal. Pulmonary/Chest: Diffuse expiratory wheezes, decreased breath sounds, inspiratory crackles lower lungs R>L. Abdominal: Soft, non-tender, non-distended, BS + Extremities: Thin extremities. No lower extremity edema bilaterally Neurological: A&O x2, some confusion regarding her diagnoses, answers most questions appropriately, intact social skills, follows commands.  Skin: Warm, dry and intact.  Psychiatric: Normal mood and affect. speech and behavior is normal. Cognition and memory are abnormal.   SpO2: SpO2: 97 % O2 Device:SpO2: 97 % O2 Flow Rate: .O2 Flow Rate (L/min): 3 L/min  IO: Intake/output summary:   Intake/Output Summary (Last 24 hours) at 08/22/15 1347 Last data filed at 08/22/15 1157  Gross per 24 hour  Intake    240 ml  Output   1150 ml  Net   -910 ml    LBM: Last BM Date: 08/21/15 Baseline Weight: Weight: 111 lb (50.349 kg) Most recent weight: Weight: 111 lb (50.349 kg)      Palliative Assessment/Data:  I feel this patient meets criteria for Hospice at Home based on the facts that our patient has disabling dyspnea- poorly responsive to bronchodilators, decreased functional capacity, prgression disease as evidenced by recent hospital visits and respiratory failure, hypoxemia at rest 82% and desaturates to 74% while ambulating, and Cor Pulmonale with PA pressure of 61.   Patient's PPS level is estimated to be 40% given that she mainly sits throughout the day, unable to most activity due to extensive disease, intake is reduced to normal, and she suffers from some confusion from dementia.   Additional Data Reviewed:  CBC:    Component Value Date/Time   WBC 12.9* 08/22/2015 0346   HGB 11.0*  08/22/2015 0346   HCT 34.4* 08/22/2015 0346   PLT 242 08/22/2015 0346   MCV 97.5 08/22/2015 0346   NEUTROABS 4.3 06/04/2014 0301   LYMPHSABS 2.3  06/04/2014 0301   MONOABS 0.8 06/04/2014 0301   EOSABS 0.2 06/04/2014 0301   BASOSABS 0.0 06/04/2014 0301   Comprehensive Metabolic Panel:    Component Value Date/Time   NA 142 08/22/2015 0346   K 3.3* 08/22/2015 0346   CL 98* 08/22/2015 0346   CO2 32 08/22/2015 0346   BUN 31* 08/22/2015 0346   CREATININE 0.98 08/22/2015 0346   CREATININE 0.90 08/08/2014 0812   GLUCOSE 160* 08/22/2015 0346   CALCIUM 8.3* 08/22/2015 0346   AST 28 07/08/2015 1230   ALT 31 07/08/2015 1230   ALKPHOS 132* 07/08/2015 1230   BILITOT 0.6 07/08/2015 1230   PROT 7.3 07/08/2015 1230   ALBUMIN 3.9 07/08/2015 1230     Time In: 9:45 am Time Out: 11:30 am Time Total: 1 hour and 45 minutes Greater than 50%  of this time was spent counseling and coordinating care related to the above assessment and plan.  Signed by: Martyn Malay, DO PGY-2 Internal Medicine Resident Pager # [phone] 08/22/2015 1:47 PM   Imogene Burn, PA-C Palliative Medicine Pager: [phone]   Please contact Palliative Medicine Team phone at [phone] for questions and concerns.

## 2015-08-22 NOTE — Progress Notes (Signed)
Daughter stated that she would like for her mom to go home with an Oxygen in case she needs it.

## 2015-08-23 DIAGNOSIS — J441 Chronic obstructive pulmonary disease with (acute) exacerbation: Principal | ICD-10-CM

## 2015-08-23 DIAGNOSIS — E785 Hyperlipidemia, unspecified: Secondary | ICD-10-CM

## 2015-08-23 DIAGNOSIS — I10 Essential (primary) hypertension: Secondary | ICD-10-CM

## 2015-08-23 DIAGNOSIS — J9621 Acute and chronic respiratory failure with hypoxia: Secondary | ICD-10-CM

## 2015-08-23 DIAGNOSIS — E1169 Type 2 diabetes mellitus with other specified complication: Secondary | ICD-10-CM

## 2015-08-23 LAB — GLUCOSE, CAPILLARY
GLUCOSE-CAPILLARY: 154 mg/dL — AB (ref 65–99)
GLUCOSE-CAPILLARY: 188 mg/dL — AB (ref 65–99)
GLUCOSE-CAPILLARY: 129 mg/dL — AB (ref 65–99)
GLUCOSE-CAPILLARY: 109 mg/dL — AB (ref 65–99)

## 2015-08-23 LAB — CBC
HEMATOCRIT: 34.8 % — AB (ref 36.0–46.0)
Hemoglobin: 11.4 g/dL — ABNORMAL LOW (ref 12.0–15.0)
MCH: 31.4 pg (ref 26.0–34.0)
MCHC: 32.8 g/dL (ref 30.0–36.0)
MCV: 95.9 fL (ref 78.0–100.0)
Platelets: 238 10*3/uL (ref 150–400)
RBC: 3.63 MIL/uL — AB (ref 3.87–5.11)
RDW: 15.1 % (ref 11.5–15.5)
WBC: 10.4 10*3/uL (ref 4.0–10.5)

## 2015-08-23 LAB — BASIC METABOLIC PANEL
ANION GAP: 12 (ref 5–15)
BUN: 28 mg/dL — ABNORMAL HIGH (ref 6–20)
CALCIUM: 8 mg/dL — AB (ref 8.9–10.3)
CO2: 30 mmol/L (ref 22–32)
Chloride: 99 mmol/L — ABNORMAL LOW (ref 101–111)
Creatinine, Ser: 0.84 mg/dL (ref 0.44–1.00)
Glucose, Bld: 151 mg/dL — ABNORMAL HIGH (ref 65–99)
Potassium: 3.5 mmol/L (ref 3.5–5.1)
Sodium: 141 mmol/L (ref 135–145)

## 2015-08-23 MED ORDER — AMOXICILLIN-POT CLAVULANATE 500-125 MG PO TABS
1.0000 | ORAL_TABLET | Freq: Two times a day (BID) | ORAL | Status: DC
  Administered 2015-08-23 – 2015-08-24 (×2): 500 mg via ORAL
  Filled 2015-08-23 (×2): qty 1

## 2015-08-23 MED ORDER — AZITHROMYCIN 500 MG PO TABS: 500.0000 mg | ORAL_TABLET | Freq: Every evening | ORAL | Status: DC

## 2015-08-23 MED ORDER — PREDNISONE 50 MG PO TABS
60.0000 mg | ORAL_TABLET | Freq: Every day | ORAL | Status: DC
  Administered 2015-08-24: 60 mg via ORAL
  Filled 2015-08-23: qty 1

## 2015-08-23 MED FILL — HYDROXYZINE PAM 25 MG CAP: 25 | 15 days supply | Qty: 30 | Fill #0

## 2015-08-23 MED FILL — POTASSIUM CL ER 20 MEQ TABL: 20 | 30 days supply | Qty: 30 | Fill #0

## 2015-08-23 NOTE — Telephone Encounter (Signed)
Notified Barbara at [facility].

## 2015-08-23 NOTE — Progress Notes (Signed)
[facility]                                                                              Patient Demographics  Sarah [name], is a 82 y.o. female, DOB - 1931-07-09, [id number]  Admit date - 08/19/2015   Admitting Physician Ivor Costa, MD  Outpatient Primary MD for the patient is Nance Pear., NP  Outpatient specialists: Dr. Rosina Lowenstein, pulmonologist  LOS - 4  days    Chief Complaint  Patient presents with  . Shortness of Breath       Brief Summary    Patient is a 82 y.o. female with Dementia, hypertension, hyperlipidemia, diabetes mellitus, COPD, depression, tobacco abuse, OSA, atrial fibrillation not on Eliquis anymore, combined systolic and diastolic CHF with EF [id number], LBBB, pancreas divisum, presented with cough, dyspnea and wheezing. Patient was admitted for acute hypoxic respiratory failure with COPD exacerbation.  Patient is progressively improving, initially was on NRB mask, now weaned down to 2 L, she will be transferred to [place] today. Palliative Medicine was consulted, as patient has advanced emphysema, noncompliant, active smoking.  Will go home with hospice in AM   Assessment & Plan      Acute on chronic respiratory failure with hypoxia (HCC)Likely due to underlying fibrosis, Moderate to severe emphysema/COPD exacerbation, Ongoing smoking- better today - PO abx/steroids,  DuoNeb's Brovana, Pulmicort, flutter valve  -  Blood cultures negative so far, flu negative, urine strep antigen negative - Patient has no intention of quitting smoking - hospice at home - Home O2 evaluation- did not drop < 89%  Chronic combined systolic and diastolic CHF (congestive heart failure) ([place]): 2-D echo on 06/06/14 showed EF of 40%. CHF is compensated. -continue Lasix and metoprolol - 2-D echo 4/19 showed EF of 0000000, grade 2 diastolic dysfunction, pulmonary hypertension moderate  Elevated troponin: Possibly due to acute respiratory failure and  demand ischemia  - Patient is not on eliquis (confirmed with daughter- cards' dc'd due to fall risk), placed on aspirin 81 mg daily - 2-D echo showed preserved EF, no regional wall motion abnormalities  HTN: -on lasix and metoprolol  Atrial Fibrillation: CHA2DS2-VASc Score is 6, no anticoagulation due to fall risk.  -Continue amiodarone, metoprolol for rate control - Patient's daughter confirmed that patient is not longer taking eliquis due to recent falls  DM-II: Last A1c 5.9 on 10/14/14 -SSI, follow closely with steroids on board, tapering IV steroids today -Hemoglobin A1c 6.3   HLD: Last LDL was 99 on 07/08/15 -Continue home medications: Lipitor -Lipid panel with LDL 74  Tobacco abuse -Smoking cessation counseling provided, patient has no intention of quitting smoking -Nicotine patch  Depression and anxiety: Stable, no suicidal or homicidal ideations. -Continue home medications: Lexapro and hydroxyzine   Code Status: DO NOT RESUSCITATE   Family Communication: Discussed in detail with the patient, all imaging results, lab results explained to the patient. Discussed with daughter   Disposition Plan: d/c in AM  Time Spent in minutes   3minutes  Procedures  None  Consults   Palliative medicine DVT Prophylaxis Place on heparin subcutaneous  Medications  Scheduled Meds: . amiodarone  400  mg Oral Daily  . amoxicillin-clavulanate  1 tablet Oral Q12H  . arformoterol  15 mcg Nebulization BID  . aspirin EC  81 mg Oral Daily  . atorvastatin  40 mg Oral q1800  . budesonide (PULMICORT) nebulizer solution  0.25 mg Nebulization BID  . calcium-vitamin D  1 tablet Oral BID  . cholecalciferol  1,000 Units Oral Daily  . dextromethorphan-guaiFENesin  1 tablet Oral BID  . donepezil  5 mg Oral QHS  . escitalopram  10 mg Oral Daily  . furosemide  40 mg Oral BID  . heparin subcutaneous  5,000 Units Subcutaneous Q8H  . insulin aspart  0-5 Units Subcutaneous QHS  . insulin aspart  0-9  Units Subcutaneous TID WC  . ipratropium-albuterol  3 mL Nebulization TID  . metoprolol  25 mg Oral Daily  . nicotine  21 mg Transdermal Daily  . polycarbophil  625 mg Oral Daily  . potassium chloride  40 mEq Oral Daily  . [START ON 08/24/2015] predniSONE  60 mg Oral Q breakfast  . senna-docusate  1 tablet Oral QHS   Continuous Infusions:  PRN Meds:.albuterol, hydrOXYzine, lidocaine, LORazepam, oxyCODONE, polyethylene glycol   Antibiotics   Anti-infectives    Start     Dose/Rate Route Frequency Ordered Stop   08/23/15 1800  azithromycin (ZITHROMAX) tablet 500 mg  Status:  Discontinued     500 mg Oral Every evening 08/23/15 0923 08/23/15 1345   08/23/15 1800  amoxicillin-clavulanate (AUGMENTIN) 500-125 MG per tablet 500 mg     1 tablet Oral Every 12 hours 08/23/15 1346     08/21/15 2100  azithromycin (ZITHROMAX) tablet 250 mg  Status:  Discontinued     250 mg Oral Daily 08/20/15 0316 08/20/15 0728   08/21/15 2000  levofloxacin (LEVAQUIN) IVPB 750 mg  Status:  Discontinued     750 mg 100 mL/hr over 90 Minutes Intravenous Every 48 hours 08/20/15 0227 08/20/15 0316   08/20/15 2100  azithromycin (ZITHROMAX) tablet 500 mg  Status:  Discontinued     500 mg Oral Daily 08/20/15 0316 08/20/15 0728   08/20/15 2000  azithromycin (ZITHROMAX) 500 mg in dextrose 5 % 250 mL IVPB  Status:  Discontinued     500 mg 250 mL/hr over 60 Minutes Intravenous Every 24 hours 08/20/15 0728 08/23/15 0923   08/20/15 2000  cefTRIAXone (ROCEPHIN) 1 g in dextrose 5 % 50 mL IVPB  Status:  Discontinued     1 g 100 mL/hr over 30 Minutes Intravenous Every 24 hours 08/20/15 0728 08/23/15 1345   08/19/15 2115  levofloxacin (LEVAQUIN) IVPB 750 mg  Status:  Discontinued     750 mg 100 mL/hr over 90 Minutes Intravenous Every 24 hours 08/19/15 2105 08/20/15 0227        Subjective:   No new complaints    Objective:   Filed Vitals:   08/23/15 0736 08/23/15 0737 08/23/15 0858 08/23/15 1333  BP:   150/82     Pulse:   78   Temp:   97.9 F (36.6 C)   TempSrc:   Oral   Resp:   18   Height:      Weight:      SpO2: 98% 98% 98% 98%    Intake/Output Summary (Last 24 hours) at 08/23/15 1510 Last data filed at 08/23/15 0725  Gross per 24 hour  Intake    900 ml  Output      0 ml  Net    900 ml  Wt Readings from Last 3 Encounters:  08/22/15 47 kg (103 lb 9.9 oz)  07/08/15 50.531 kg (111 lb 6.4 oz)  07/03/15 50.349 kg (111 lb)     Exam  General: Alert and oriented x 3, NAD  HEENT:    Neck: Supple, no JVD  CVS: S1 S2 clear, RRR  Respiratory:Wheezing is significant improved today  Abdomen: Soft, nontender, nondistended, + bowel sounds  Ext: no cyanosis clubbing or edema  Neuro: no new deficits  Skin: No rashes  Psych: Normal affect and demeanor, alert and oriented x3    Data Reviewed:  I have personally reviewed following labs and imaging studies  Micro Results Recent Results (from the past 240 hour(s))  MRSA PCR Screening     Status: None   Collection Time: 08/20/15  2:38 AM  Result Value Ref Range Status   MRSA by PCR NEGATIVE NEGATIVE Final    Comment:        The GeneXpert MRSA Assay (FDA approved for NASAL specimens only), is one component of a comprehensive MRSA colonization surveillance program. It is not intended to diagnose MRSA infection nor to guide or monitor treatment for MRSA infections.   Respiratory virus panel     Status: Abnormal   Collection Time: 08/20/15  3:55 AM  Result Value Ref Range Status   Source - RVPAN NASAL SWAB  Corrected   Respiratory Syncytial Virus A Negative Negative Final   Respiratory Syncytial Virus B Negative Negative Final   Influenza A Negative Negative Final   Influenza B Negative Negative Final   Parainfluenza 1 Negative Negative Final   Parainfluenza 2 Negative Negative Final   Parainfluenza 3 Negative Negative Final   Metapneumovirus Positive (A) Negative Final    Comment:                   Client Requested  Flag   Rhinovirus Negative Negative Final   Adenovirus Negative Negative Final    Comment: (NOTE) Performed At: [facility] [place], [place] [id number] Lindon Romp MD [id number]   Culture, blood (routine x 2) Call MD if unable to obtain prior to antibiotics being given     Status: None (Preliminary result)   Collection Time: 08/20/15  4:10 AM  Result Value Ref Range Status   Specimen Description BLOOD LEFT ARM  Final   Special Requests IN PEDIATRIC BOTTLE 3ML  Final   Culture NO GROWTH 3 DAYS  Final   Report Status PENDING  Incomplete  Culture, blood (routine x 2) Call MD if unable to obtain prior to antibiotics being given     Status: None (Preliminary result)   Collection Time: 08/20/15  4:25 AM  Result Value Ref Range Status   Specimen Description BLOOD RIGHT HAND  Final   Special Requests IN PEDIATRIC BOTTLE 3ML  Final   Culture NO GROWTH 3 DAYS  Final   Report Status PENDING  Incomplete    Radiology Reports Dg Chest Portable 1 View  08/19/2015  CLINICAL DATA:  Respiratory distress EXAM: PORTABLE CHEST 1 VIEW COMPARISON:  July 08, 2015 FINDINGS: Lungs are somewhat hyperexpanded. There is persistent fibrotic change throughout the lungs, most severely in the basilar regions bilaterally. There is no frank edema or consolidation. Heart is upper normal in size with pulmonary vascularity within normal limits. There is atherosclerotic calcification in the aorta. No adenopathy. There is extensive arthropathy in both shoulders, more severe on the left than on the right. IMPRESSION: Fibrotic change throughout the  lungs bilaterally, most severe in the bases bilaterally, a stable finding. No frank edema or consolidation. Stable cardiac silhouette. Electronically Signed   By: Lowella Grip III M.D.   On: 08/19/2015 21:14    CBC  Recent Labs Lab 08/19/15 2100 08/21/15 0410 08/22/15 0346 08/23/15 0602  WBC 9.5 16.4* 12.9* 10.4  HGB 13.1 11.1* 11.0* 11.4*   HCT 40.2 35.0* 34.4* 34.8*  PLT 272 253 242 238  MCV 99.5 98.6 97.5 95.9  MCH 32.4 31.3 31.2 31.4  MCHC 32.6 31.7 32.0 32.8  RDW 15.7* 15.5 15.3 15.1    Chemistries   Recent Labs Lab 08/19/15 2100 08/21/15 0410 08/22/15 0346 08/23/15 0602  NA 140 142 142 141  K 4.1 3.4* 3.3* 3.5  CL 105 101 98* 99*  CO2 24 29 32 30  GLUCOSE 246* 164* 160* 151*  BUN 22* 25* 31* 28*  CREATININE 1.16* 1.01* 0.98 0.84  CALCIUM 9.1 8.4* 8.3* 8.0*   ------------------------------------------------------------------------------------------------------------------ estimated creatinine clearance is 39 mL/min (by C-G formula based on Cr of 0.84). ------------------------------------------------------------------------------------------------------------------ No results for input(s): HGBA1C in the last 72 hours. ------------------------------------------------------------------------------------------------------------------ No results for input(s): CHOL, HDL, LDLCALC, TRIG, CHOLHDL, LDLDIRECT in the last 72 hours. ------------------------------------------------------------------------------------------------------------------ No results for input(s): TSH, T4TOTAL, T3FREE, THYROIDAB in the last 72 hours.  Invalid input(s): FREET3 ------------------------------------------------------------------------------------------------------------------ No results for input(s): VITAMINB12, FOLATE, FERRITIN, TIBC, IRON, RETICCTPCT in the last 72 hours.  Coagulation profile  Recent Labs Lab 08/20/15 0358  INR 1.25    No results for input(s): DDIMER in the last 72 hours.  Cardiac Enzymes  Recent Labs Lab 08/20/15 0318 08/20/15 0949 08/20/15 1528  TROPONINI 0.06* 0.04* 0.04*   ------------------------------------------------------------------------------------------------------------------ Invalid input(s): POCBNP   Recent Labs  08/21/15 2153 08/22/15 0758 08/22/15 1225 08/22/15 2312  08/23/15 0740 08/23/15 1142  GLUCAP 195* 177* 202* 178* 154* 188*     Tylar Merendino U Falen Lehrmann DO. [facility] 08/23/2015, 3:10 PM  Pager: [phone] Between 7am to 7pm - call Pager - [phone]  After 7pm go to [email or web address] - password [email or web address]  Call night coverage person covering after 7pm

## 2015-08-23 NOTE — Progress Notes (Signed)
Physical Therapy Treatment Patient Details Name: Sarah [name] MRN: [id number] DOB: [birth year] Today's Date: 08/23/2015    History of Present Illness Sarah [name] is a 82 y.o. female who presents to the Emergency Department with a PMHx of HTN, DM, atrial fibrillation, and COPD complaining of SOB with an associated cough, PTA. Daughter states she has been hearing a change in the patient breath sounds for the past several weeks. She has been trying to get the pt to see a Pulmonologist for quite sometime and finally made an appointment to see them tomorrow. However, tonight, the pts breathing worsened so the daughter brought the pt to the ED for evaluation.     PT Comments    Patient reports going home tomorrow.  Feel will have benefit from [place], though reports are she is going home with Hospice care. Spoke with daughter about assist level for ambulation and need for assist with all OOB mobility.  Will follow along until d/c.  Follow Up Recommendations  Supervision/Assistance - 24 hour;Home health PT     Equipment Recommendations  3in1 (PT);Other (comment) (rollator)    Recommendations for Other Services       Precautions / Restrictions Precautions Precautions: Fall Precaution Comments: oxygen needs    Mobility  Bed Mobility Overal bed mobility: Needs Assistance       Supine to sit: Min assist;HOB elevated Sit to supine: Supervision   General bed mobility comments: able to bring her legs in the bed, positioned bed flat with rail down for getting up to [facility], pt requested fourth rail up to keep her from falling out of bed  Transfers Overall transfer level: Needs assistance Equipment used: Rolling walker (2 wheeled) Transfers: Sit to/from Stand Sit to Stand: Min assist Stand pivot transfers: Min assist       General transfer comment: needs assist for balance/safety, poor awareness for clothing management and safety wtih walker with bed to [facility]  transfers  Ambulation/Gait Ambulation/Gait assistance: Min assist Ambulation Distance (Feet): 90 Feet (x 2)   Gait Pattern/deviations: Step-through pattern;Decreased stride length;Trunk flexed;Narrow base of support;Shuffle     General Gait Details: assist for safety; pt unaware of O2 tubing, tank and needed assist for safety   Stairs            Wheelchair Mobility    Modified Rankin (Stroke Patients Only)       Balance Overall balance assessment: Needs assistance   Sitting balance-Leahy Scale: Fair Sitting balance - Comments: supervision in sitting on BSC while reaching back for hygiene     Standing balance-Leahy Scale: Poor Standing balance comment: min A for balance standing without walker                    Cognition Arousal/Alertness: Awake/alert Behavior During Therapy: WFL for tasks assessed/performed   Area of Impairment: Memory;Safety/judgement;Problem solving     Memory: Decreased short-term memory;Decreased recall of precautions   Safety/Judgement: Decreased awareness of safety;Decreased awareness of deficits   Problem Solving: Slow processing;Requires verbal cues      Exercises      General Comments General comments (skin integrity, edema, etc.): spoke with pt's daughter by phone about set up at home; not currently with 24 hour assist, but pt will have lifeline and have assist most of the time, but has option to get more help in the home if needed.  Discussed fall risk/need for assist when on her feet with walker.      Pertinent Vitals/Pain Pain Score: 7  Pain Location: back Pain Descriptors / Indicators: Aching Pain Intervention(s): Monitored during session;Repositioned    Home Living                      Prior Function            PT Goals (current goals can now be found in the care plan section) Progress towards PT goals: Progressing toward goals    Frequency  Min 3X/week    PT Plan Current plan remains  appropriate    Co-evaluation             End of Session Equipment Utilized During Treatment: Gait belt;Oxygen Activity Tolerance: Patient tolerated treatment well Patient left: in bed;with call bell/phone within reach;with bed alarm set     Time: 1420-1445 PT Time Calculation (min) (ACUTE ONLY): 25 min  Charges:  $Gait Training: 23-37 mins                    G Codes:      Reginia Naas 09-11-15, 4:53 PM  Magda Kiel, [place] 11-Sep-2015

## 2015-08-23 NOTE — Care Management Important Message (Signed)
Important Message  Patient Details  Name: Sarah [name] MRN: [id number] Date of Birth: June 18, 1931   Medicare Important Message Given:  Yes    Avacyn Kloosterman P Ra Pfiester 08/23/2015, 1:36 PM

## 2015-08-23 NOTE — Telephone Encounter (Signed)
Rx request to pharmacy, 30-day only, patient is in transition from hospital to Hospice care/SLS

## 2015-08-23 NOTE — Clinical Social Work Note (Signed)
CSW talked briefly with daughter and was given permission to contact daughter Sarah [name] regarding discharge planning. Patient reported that she would discharge from hospital home on Saturday. CSW contacted daughter [phone]) regarding discharge and patient will go home once medically stable. Per Ms. Sarah [name], patient is in the home with her, her daughter and daughter's 2 children. Daughter added that someone is usually at home with her mother, either her granddaughter or herself, however there are brief periods when she is alone. Daughter reported that patient has a medical alert and hospice has recently become involved and will provide some of the durable medical equipment and assist patient with her baths. Daughter also talked with CSW regarding the pets they have at home and that patient is very attached to her cats. Ms. Sarah [name] feels very comfortable with patient coming home. CSW contacted MD and provided update.    CSW signing off as patient will discharge home, however please re-consult if any SW services arise prior to discharge.  Sarah [name], MSW, LCSW Licensed Clinical Social Worker [place] [phone]

## 2015-08-23 NOTE — Telephone Encounter (Signed)
I can be the attending. Thanks.

## 2015-08-24 DIAGNOSIS — Z720 Tobacco use: Secondary | ICD-10-CM

## 2015-08-24 LAB — GLUCOSE, CAPILLARY: GLUCOSE-CAPILLARY: 114 mg/dL — AB (ref 65–99)

## 2015-08-24 MED ORDER — IPRATROPIUM-ALBUTEROL 0.5-2.5 (3) MG/3ML IN SOLN: 3.0000 mL | Freq: Three times a day (TID) | RESPIRATORY_TRACT | Status: AC

## 2015-08-24 MED ORDER — BUDESONIDE 0.25 MG/2ML IN SUSP: 0.2500 mg | Freq: Two times a day (BID) | RESPIRATORY_TRACT | Status: AC

## 2015-08-24 MED ORDER — PREDNISONE 10 MG PO TABS: ORAL_TABLET | ORAL | Status: AC

## 2015-08-24 MED ORDER — LORAZEPAM 0.5 MG PO TABS: 0.5000 mg | ORAL_TABLET | Freq: Four times a day (QID) | ORAL | Status: AC | PRN

## 2015-08-24 MED ORDER — ARFORMOTEROL TARTRATE 15 MCG/2ML IN NEBU: 15.0000 ug | INHALATION_SOLUTION | Freq: Two times a day (BID) | RESPIRATORY_TRACT | Status: AC

## 2015-08-24 MED ORDER — ALBUTEROL SULFATE (2.5 MG/3ML) 0.083% IN NEBU: 2.5000 mg | INHALATION_SOLUTION | RESPIRATORY_TRACT | Status: AC | PRN

## 2015-08-24 MED ORDER — OXYCODONE HCL 5 MG PO TABS: 2.5000 mg | ORAL_TABLET | ORAL | Status: AC | PRN

## 2015-08-24 NOTE — Discharge Summary (Addendum)
Physician Discharge Summary  Sarah [name] [id number] DOB: [birth year] DOA: 08/19/2015  PCP: Nance Pear., NP  Admit date: 08/19/2015 Discharge date: 08/24/2015   Recommendations for Outpatient Follow-Up:   Hospice at home Patient has no intention of stopping smoking- spoke with her about fire hazard while on O2 Family refused SNF 24 hour supervision   Discharge Diagnosis:   Principal Problem:   Acute on chronic respiratory failure with hypoxia ([place]) Active Problems:   Essential hypertension   OSA (obstructive sleep apnea)   A-fib (HCC)   LBBB (left bundle branch block)   Diabetes type 2, controlled (HCC)   Chronic combined systolic and diastolic CHF (congestive heart failure) (HCC)   Hyperlipidemia   Tobacco abuse   Chronic anticoagulation   COPD exacerbation (HCC)   Elevated troponin   Discharge disposition:  Home with hospice  Discharge Condition: terminal  Diet recommendation:   Regular.  Wound care: None.   History of Present Illness:   Sarah [name] is a 82 y.o. female with PMH of hypertension, hyperlipidemia, diabetes mellitus, COPD, depression, tobacco abuse, OSA, atrial fibrillation on Eliquis, combined systolic and diastolic CHF with EF [id number], LBBB, pancreas divisum, who presents with cough and shortness of breath.  Patient reports that she has been having shortness of breath and cough in the past several days, which has been progressively getting worse. She coughs up clear mucus. No fever, chills, runny nose, sore throat, chest pain. Patient does not have nausea, vomiting, abdominal pain, symptoms of UTI. No unilateral weakness.  In ED, patient was found to have elevated troponin 0.06, BNP 488.3, WBC 9.5, temperature normal, no tachycardia, has tachypnea, creatinine 1.16. Chest x-ray showed fibrotic change throughout the lungs bilaterally, most severe in the bases bilaterally, a stable finding. No frank edema or consolidation. Stable  cardiac silhouette. The patient is admitted to inpatient for further intervention and treatment.   Hospital Course by Problem:   Acute on chronic respiratory failure with hypoxia (HCC)Likely due to underlying fibrosis, Moderate to severe emphysema/COPD exacerbation, Ongoing smoking- better today - PO abx/steroids, DuoNeb's Brovana, Pulmicort, flutter valve  - Blood cultures negative so far, flu negative, urine strep antigen negative - Patient has no intention of quitting smoking - hospice at home - Home O2 +metaneumovirus  Chronic combined systolic and diastolic CHF (congestive heart failure) ([place]): 2-D echo on 06/06/14 showed EF of 40%. CHF is compensated. -continue Lasix and metoprolol - 2-D echo 4/19 showed EF of 0000000, grade 2 diastolic dysfunction, pulmonary hypertension moderate  Elevated troponin: Possibly due to acute respiratory failure and demand ischemia  - Patient is not on eliquis (confirmed with daughter- cards' dc'd due to fall risk), placed on aspirin 81 mg daily - 2-D echo showed preserved EF, no regional wall motion abnormalities  HTN: -on lasix and metoprolol  Atrial Fibrillation: CHA2DS2-VASc Score is 6, no anticoagulation due to fall risk.  -Continue amiodarone, metoprolol for rate control - Patient's daughter confirmed that patient is not longer taking eliquis due to recent falls  DM-II: Last A1c 5.9 on 10/14/14 -SSI, follow closely with steroids on board, tapering IV steroids today -Hemoglobin A1c 6.3   HLD: Last LDL was 99 on 07/08/15 -Continue home medications: Lipitor -Lipid panel with LDL 74  Tobacco abuse -Smoking cessation counseling provided, patient has no intention of quitting smoking -Nicotine patch  Depression and anxiety: Stable, no suicidal or homicidal ideations. -Continue home medications: Lexapro and hydroxyzine   Patient high fall risk but both patient and family refused  SNF Hospice at home   Medical Consultants:     Palliative care   Discharge Exam:   Filed Vitals:   08/24/15 0455 08/24/15 0810  BP: 109/48 119/52  Pulse: 54 54  Temp: 98 F (36.7 C) 98.2 F (36.8 C)  Resp: 19 18   Filed Vitals:   08/23/15 1333 08/23/15 2038 08/24/15 0455 08/24/15 0810  BP:  122/59 109/48 119/52  Pulse:  58 54 54  Temp:  98.4 F (36.9 C) 98 F (36.7 C) 98.2 F (36.8 C)  TempSrc:  Oral Oral Oral  Resp:  17 19 18   Height:      Weight:      SpO2: 98% 98% 99% 100%    Gen:  NAD    The results of significant diagnostics from this hospitalization (including imaging, microbiology, ancillary and laboratory) are listed below for reference.     Procedures and Diagnostic Studies:   Dg Chest Portable 1 View  08/19/2015  CLINICAL DATA:  Respiratory distress EXAM: PORTABLE CHEST 1 VIEW COMPARISON:  July 08, 2015 FINDINGS: Lungs are somewhat hyperexpanded. There is persistent fibrotic change throughout the lungs, most severely in the basilar regions bilaterally. There is no frank edema or consolidation. Heart is upper normal in size with pulmonary vascularity within normal limits. There is atherosclerotic calcification in the aorta. No adenopathy. There is extensive arthropathy in both shoulders, more severe on the left than on the right. IMPRESSION: Fibrotic change throughout the lungs bilaterally, most severe in the bases bilaterally, a stable finding. No frank edema or consolidation. Stable cardiac silhouette. Electronically Signed   By: Lowella Grip III M.D.   On: 08/19/2015 21:14     Labs:   Basic Metabolic Panel:  Recent Labs Lab 08/19/15 2100 08/21/15 0410 08/22/15 0346 08/23/15 0602  NA 140 142 142 141  K 4.1 3.4* 3.3* 3.5  CL 105 101 98* 99*  CO2 24 29 32 30  GLUCOSE 246* 164* 160* 151*  BUN 22* 25* 31* 28*  CREATININE 1.16* 1.01* 0.98 0.84  CALCIUM 9.1 8.4* 8.3* 8.0*   GFR Estimated Creatinine Clearance: 39 mL/min (by C-G formula based on Cr of 0.84). Liver Function Tests: No  results for input(s): AST, ALT, ALKPHOS, BILITOT, PROT, ALBUMIN in the last 168 hours. No results for input(s): LIPASE, AMYLASE in the last 168 hours. No results for input(s): AMMONIA in the last 168 hours. Coagulation profile  Recent Labs Lab 08/20/15 0358  INR 1.25    CBC:  Recent Labs Lab 08/19/15 2100 08/21/15 0410 08/22/15 0346 08/23/15 0602  WBC 9.5 16.4* 12.9* 10.4  HGB 13.1 11.1* 11.0* 11.4*  HCT 40.2 35.0* 34.4* 34.8*  MCV 99.5 98.6 97.5 95.9  PLT 272 253 242 238   Cardiac Enzymes:  Recent Labs Lab 08/19/15 2100 08/20/15 0318 08/20/15 0949 08/20/15 1528  TROPONINI 0.06* 0.06* 0.04* 0.04*   BNP: Invalid input(s): POCBNP CBG:  Recent Labs Lab 08/23/15 0740 08/23/15 1142 08/23/15 1646 08/23/15 2056 08/24/15 0809  GLUCAP 154* 188* 129* 109* 114*   D-Dimer No results for input(s): DDIMER in the last 72 hours. Hgb A1c No results for input(s): HGBA1C in the last 72 hours. Lipid Profile No results for input(s): CHOL, HDL, LDLCALC, TRIG, CHOLHDL, LDLDIRECT in the last 72 hours. Thyroid function studies No results for input(s): TSH, T4TOTAL, T3FREE, THYROIDAB in the last 72 hours.  Invalid input(s): FREET3 Anemia work up No results for input(s): VITAMINB12, FOLATE, FERRITIN, TIBC, IRON, RETICCTPCT in the last 72 hours. Microbiology Recent  Results (from the past 240 hour(s))  MRSA PCR Screening     Status: None   Collection Time: 08/20/15  2:38 AM  Result Value Ref Range Status   MRSA by PCR NEGATIVE NEGATIVE Final    Comment:        The GeneXpert MRSA Assay (FDA approved for NASAL specimens only), is one component of a comprehensive MRSA colonization surveillance program. It is not intended to diagnose MRSA infection nor to guide or monitor treatment for MRSA infections.   Respiratory virus panel     Status: Abnormal   Collection Time: 08/20/15  3:55 AM  Result Value Ref Range Status   Source - RVPAN NASAL SWAB  Corrected   Respiratory  Syncytial Virus A Negative Negative Final   Respiratory Syncytial Virus B Negative Negative Final   Influenza A Negative Negative Final   Influenza B Negative Negative Final   Parainfluenza 1 Negative Negative Final   Parainfluenza 2 Negative Negative Final   Parainfluenza 3 Negative Negative Final   Metapneumovirus Positive (A) Negative Final    Comment:                   Client Requested Flag   Rhinovirus Negative Negative Final   Adenovirus Negative Negative Final    Comment: (NOTE) Performed At: [facility] [place], [place] [id number] Lindon Romp MD [id number]   Culture, blood (routine x 2) Call MD if unable to obtain prior to antibiotics being given     Status: None (Preliminary result)   Collection Time: 08/20/15  4:10 AM  Result Value Ref Range Status   Specimen Description BLOOD LEFT ARM  Final   Special Requests IN PEDIATRIC BOTTLE 3ML  Final   Culture NO GROWTH 3 DAYS  Final   Report Status PENDING  Incomplete  Culture, blood (routine x 2) Call MD if unable to obtain prior to antibiotics being given     Status: None (Preliminary result)   Collection Time: 08/20/15  4:25 AM  Result Value Ref Range Status   Specimen Description BLOOD RIGHT HAND  Final   Special Requests IN PEDIATRIC BOTTLE 3ML  Final   Culture NO GROWTH 3 DAYS  Final   Report Status PENDING  Incomplete     Discharge Instructions:   Discharge Instructions    Diet - low sodium heart healthy    Complete by:  As directed      Diet Carb Modified    Complete by:  As directed      Discharge instructions    Complete by:  As directed   Hospice at home Stop smoking     Increase activity slowly    Complete by:  As directed             Medication List    STOP taking these medications        Black Cohosh 540 MG Caps      TAKE these medications        albuterol (2.5 MG/3ML) 0.083% nebulizer solution  Commonly known as:  PROVENTIL  Take 3 mLs (2.5 mg total) by  nebulization every 2 (two) hours as needed for wheezing or shortness of breath.     alendronate 70 MG tablet  Commonly known as:  FOSAMAX  TAKE 1 TABLET BY MOUTH EVERY 7 DAYS. TAKE WITH A FULL GLASS OF WATER ON AN EMPTY STOMACH.     amiodarone 200 MG tablet  Commonly known as:  PACERONE  TAKE  TWO TABLETS BY MOUTH DAILY     arformoterol 15 MCG/2ML Nebu  Commonly known as:  BROVANA  Take 2 mLs (15 mcg total) by nebulization 2 (two) times daily.     aspirin EC 81 MG tablet  Take 81 mg by mouth daily.     atorvastatin 40 MG tablet  Commonly known as:  LIPITOR  TAKE 1 TABLET (40 MG TOTAL) BY MOUTH DAILY.     budesonide 0.25 MG/2ML nebulizer solution  Commonly known as:  PULMICORT  Take 2 mLs (0.25 mg total) by nebulization 2 (two) times daily.     CALTRATE 600+D PO  Take 1 tablet by mouth 2 (two) times daily.     DOC-Q-LACE PO  Take 1 tablet by mouth daily.     escitalopram 10 MG tablet  Commonly known as:  LEXAPRO  Take 1 tablet (10 mg total) by mouth daily.     furosemide 40 MG tablet  Commonly known as:  LASIX  TAKE 1 TABLET BY MOUTH TWICE DAILY     glucosamine-chondroitin 500-400 MG tablet  Take 1 tablet by mouth 2 (two) times daily.     hydrOXYzine 25 MG capsule  Commonly known as:  VISTARIL  TAKE 1 OR 2 CAPSULES BY MOUTH DAILY FOR ANXIETY     ipratropium-albuterol 0.5-2.5 (3) MG/3ML Soln  Commonly known as:  DUONEB  Take 3 mLs by nebulization 3 (three) times daily.     lidocaine 5 %  Commonly known as:  LIDODERM  Place 1 patch onto the skin as needed. Remove & Discard patch within 12 hours or as directed by MD     LORazepam 0.5 MG tablet  Commonly known as:  ATIVAN  Take 1 tablet (0.5 mg total) by mouth every 6 (six) hours as needed for anxiety.     metoprolol 50 MG tablet  Commonly known as:  LOPRESSOR  Take 0.5 tablets (25 mg total) by mouth daily.     oxyCODONE 5 MG immediate release tablet  Commonly known as:  Oxy IR/ROXICODONE  Take 0.5 tablets  (2.5 mg total) by mouth every 4 (four) hours as needed for moderate pain (shortness of breath).     polycarbophil 625 MG tablet  Commonly known as:  FIBERCON  Take 625 mg by mouth daily.     polyethylene glycol packet  Commonly known as:  MIRALAX / GLYCOLAX  Take 17 g by mouth daily as needed.     potassium chloride SA 20 MEQ tablet  Commonly known as:  K-DUR,KLOR-CON  TAKE 1 TABLET BY MOUTH DAILY. TAKE AT THE SAME TIME AS FUROSEMIDE     predniSONE 10 MG tablet  Commonly known as:  DELTASONE  50 mg x 2 days, 40 mg x 2 day, 30 mg x 2 days, 20 mg x 2 days, 10 mg x 2 days then stop     Vitamin D3 2000 units Tabs  Take 1 tablet by mouth daily.           Follow-up Information    Follow up with O'SULLIVAN,MELISSA S., NP In 1 week.   Specialty:  Internal Medicine   Contact information:   [address] [place] [address] [phone]        Time coordinating discharge: 35  Signed:  Kiaya Hal[place]urton U Aurther Harlin   [facility] 08/24/2015, 9:17 AM

## 2015-08-24 NOTE — Progress Notes (Signed)
SATURATION QUALIFICATIONS: (This note is used to comply with regulatory documentation for home oxygen)  Patient Saturations on Room Air at Rest = 87%  Patient Saturations on Room Air while Ambulating = 84%  Patient Saturations on 4 Liters of oxygen while Ambulating = 93%  Please briefly explain why patient needs home oxygen: to prevent desaturation @ rest and with exertion.  [name], MBA, BSN, RN

## 2015-08-24 NOTE — Progress Notes (Signed)
Patient discharge teaching given, including activity, diet, follow-up appoints, and medications (discussed with patient's daughter [name]). Patient verbalized understanding of all discharge instructions. IV access was d/c'd. Vitals are stable. Skin is intact except as charted in most recent assessments. Pt to be escorted out by NT, to be driven home by family.  [name], MBA, BSN, RN

## 2015-08-25 ENCOUNTER — Telehealth: Payer: Self-pay | Admitting: Family

## 2015-08-25 LAB — CULTURE, BLOOD (ROUTINE X 2)
CULTURE: NO GROWTH
Culture: NO GROWTH

## 2015-08-25 NOTE — Telephone Encounter (Signed)
Please contact pt to arrange hospital follow up. 

## 2015-08-26 NOTE — Telephone Encounter (Signed)
Noted  

## 2015-08-27 ENCOUNTER — Encounter: Payer: Self-pay | Admitting: Family

## 2015-08-27 NOTE — Telephone Encounter (Signed)
Unable to reach patient at time of TCM call. Left message for [name], patient's daughter, to return call when available.

## 2015-08-28 MED FILL — BROVANA 15 MCG/2 ML SOLN: 15 | 30 days supply | Qty: 120 | Fill #0

## 2015-08-28 MED FILL — BUDESONIDE 0.25 MG/2 ML SUS: 0.25 | 15 days supply | Qty: 60 | Fill #0

## 2015-08-28 MED FILL — IPRAT-ALBUT 0.5-3(2.5) MG/3: 0.5-2.5 (3) | 40 days supply | Qty: 360 | Fill #0

## 2015-08-29 ENCOUNTER — Encounter: Payer: Self-pay | Admitting: Family

## 2015-08-29 ENCOUNTER — Telehealth: Payer: Self-pay | Admitting: Family

## 2015-08-29 MED FILL — FUROSEMIDE 40 MG TABLET: 40 | 30 days supply | Qty: 60 | Fill #1

## 2015-08-29 MED FILL — ATORVASTATIN 40 MG TABLET: 40 | 30 days supply | Qty: 30 | Fill #5

## 2015-08-29 NOTE — Telephone Encounter (Signed)
Noted. Message routed to PCP for FYI.  

## 2015-08-29 NOTE — Telephone Encounter (Signed)
Caller name: Quenton Fetter with [facility] be reached: [phone]  Reason for call: Pt was admitted to hospice services for nursing and social work 4/24 afternoon. Family wants pt to have assistance with bathing but has not agreed to aid yet. They will try to build her trust first.

## 2015-09-02 ENCOUNTER — Telehealth: Payer: Self-pay | Admitting: *Deleted

## 2015-09-02 NOTE — Telephone Encounter (Signed)
D/c alendronate, d/c atorvastatin. Ok to start oxycodone 5/325 one tab every 6 hours prn pain/shortness of breath #30.  Can print rx if they need it.  D/c daily weights

## 2015-09-02 NOTE — Telephone Encounter (Signed)
Gave verbal to [facility] and she states they will send order for PCP to sign. No rx needed at this time.

## 2015-09-02 NOTE — Telephone Encounter (Signed)
Received call from [place] @ [phone] with Hospice with update on pt. Saw pt today and she has fallen 6 times since Friday. Mostly due to getting up on her own and going to the garage to smoke. Doesn't want on assistance from family. Takes her O2 off to go smoke and O2 drops and becomes disoriented. Family has now taken her cigarettes and she must ask when she wants one. Pt has a large bruise / black left eye and an abrasion to her forehead. Feels sore all over. Currently on oxycodone as needed for pain and it is helping current pain. States pt has fallen a few times weighing herself. Did a lot of education with pt and family. States pt is just in that transition stage that makes it difficult to manage / minimize pt's ambulation / falls. States home environment is also a little chaotic with small children / multiple family members within the home.   Will continue seeing pt twice a week for a while.   1.  Wants to add oxycodone as needed for shortness of      breath as well as pain?  2.   Wants to take off daily weights due to increased falls? She has advised pt to monitor for increased sob and when to go to ER.  3.   Hospice Dr wants to know if they can stop atorvastatin and alendronate due to being long term meds and pt prognosis is not long term?  Please advise if any other recommendations?

## 2015-09-10 MED FILL — oxyCODONE HCL 5 MG TABS: 5 | 10 days supply | Qty: 30 | Fill #0

## 2015-09-20 MED FILL — METOPROLOL TARTRATE 25 MG T: 25 | 15 days supply | Qty: 15 | Fill #0

## 2015-09-23 MED FILL — DEXAMETHASONE 4 MG TABLET: 4 | 15 days supply | Qty: 15 | Fill #0

## 2015-09-23 MED FILL — OMEPRAZOLE DR 20 MG CAPSULE: 20 | 15 days supply | Qty: 15 | Fill #0

## 2015-09-23 MED FILL — ESCITALOPRAM 20 MG TABLET: 20 | 15 days supply | Qty: 15 | Fill #0

## 2015-09-24 MED FILL — oxyCODONE HCL 5 MG TABS: 5 | 5 days supply | Qty: 30 | Fill #0

## 2015-10-01 MED FILL — AMIODARONE HCL 200 MG TAB: 200 | 30 days supply | Qty: 60 | Fill #1

## 2015-10-01 MED FILL — oxyCODONE HCL 5 MG TABS: 5 | 8 days supply | Qty: 45 | Fill #0

## 2015-10-01 MED FILL — METOPROLOL TARTRATE 25 MG T: 25 | 15 days supply | Qty: 15 | Fill #0

## 2015-10-01 MED FILL — POTASSIUM CL ER 20 MEQ TABL: 20 | 15 days supply | Qty: 15 | Fill #0

## 2015-10-02 MED FILL — DEXAMETHASONE 4 MG TABLET: 4 | 15 days supply | Qty: 15 | Fill #1

## 2015-10-02 MED FILL — ESCITALOPRAM 20 MG TABLET: 20 | 15 days supply | Qty: 15 | Fill #1

## 2015-10-02 MED FILL — OMEPRAZOLE DR 20 MG CAPSULE: 20 | 15 days supply | Qty: 15 | Fill #1

## 2015-10-14 ENCOUNTER — Telehealth: Payer: Self-pay

## 2015-10-14 MED FILL — ATROPINE 1% EYE DROPS: 1 | 13 days supply | Qty: 15 | Fill #0

## 2015-10-14 MED FILL — BISCOLAX 10 MG SUPPOSITORY: 10 | 15 days supply | Qty: 5 | Fill #0

## 2015-10-14 MED FILL — HALOPERIDOL 0.5 MG TABLET: 0.5 | 5 days supply | Qty: 45 | Fill #0

## 2015-10-14 NOTE — Telephone Encounter (Signed)
Roxanne from Hospice called to give update on patient. Patient has been declining over the last few days they transitioned to only comfort medication and she will be seeing her 3 days a week and aides will see her daily from now on. If you have any questions you can call her [phone]

## 2015-10-15 ENCOUNTER — Ambulatory Visit: Payer: Self-pay | Admitting: Neurology

## 2015-10-16 ENCOUNTER — Telehealth: Payer: Self-pay | Admitting: Family

## 2015-10-17 NOTE — Telephone Encounter (Signed)
FYI:  Melissa. I placed sympathy card on your desk.

## 2015-10-20 NOTE — Telephone Encounter (Signed)
Noted  

## 2015-10-21 ENCOUNTER — Telehealth: Payer: Self-pay | Admitting: *Deleted

## 2015-10-21 NOTE — Telephone Encounter (Signed)
Death certificate received from [place] last week. Certificate signed and placed at front desk for pick up. Called [facility] for pick up and copy faxed to [phone] at their request. Copy sent for scanning.

## 2015-11-02 NOTE — Telephone Encounter (Signed)
Caller name: Lupita Dawn with [facility] [place] [facility] be reached: [phone]  Reason for call: Pt was pronounced dead this morning at 10:57am today by Roxanne.

## 2015-11-02 NOTE — Telephone Encounter (Signed)
Noted  

## 2015-11-02 DEATH — deceased

## 2015-12-26 ENCOUNTER — Ambulatory Visit: Payer: Self-pay | Admitting: Neurology

## 2015-12-31 IMAGING — CR DG CHEST 2V
2 series · 2 of 2 positions shown · non-contrast
Comparison: Chest radiograph performed 12/07/2011

CLINICAL DATA: Acute onset of shortness of breath and wheezing.
Lower back pain. Initial encounter.

EXAM:
CHEST  2 VIEW

[w chest pa]
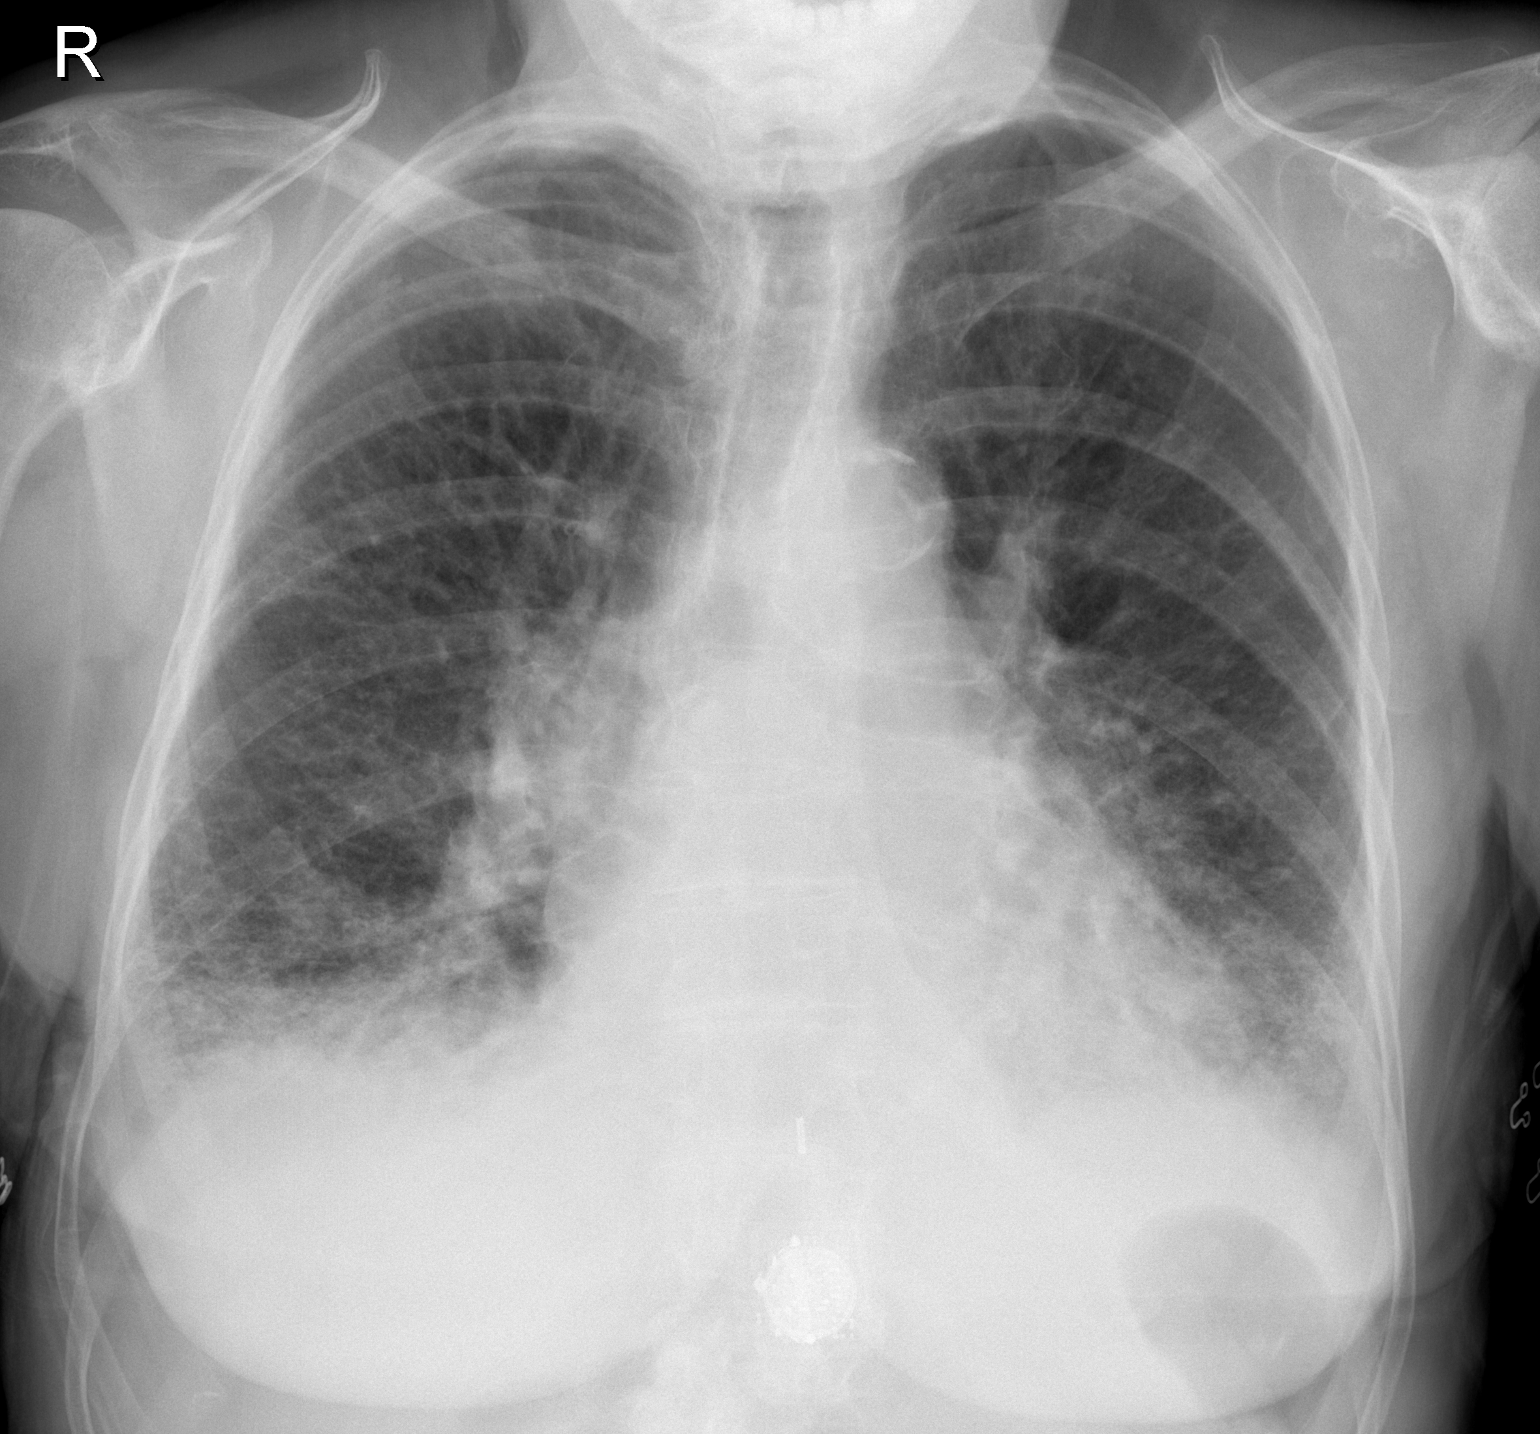

[w chest lat]
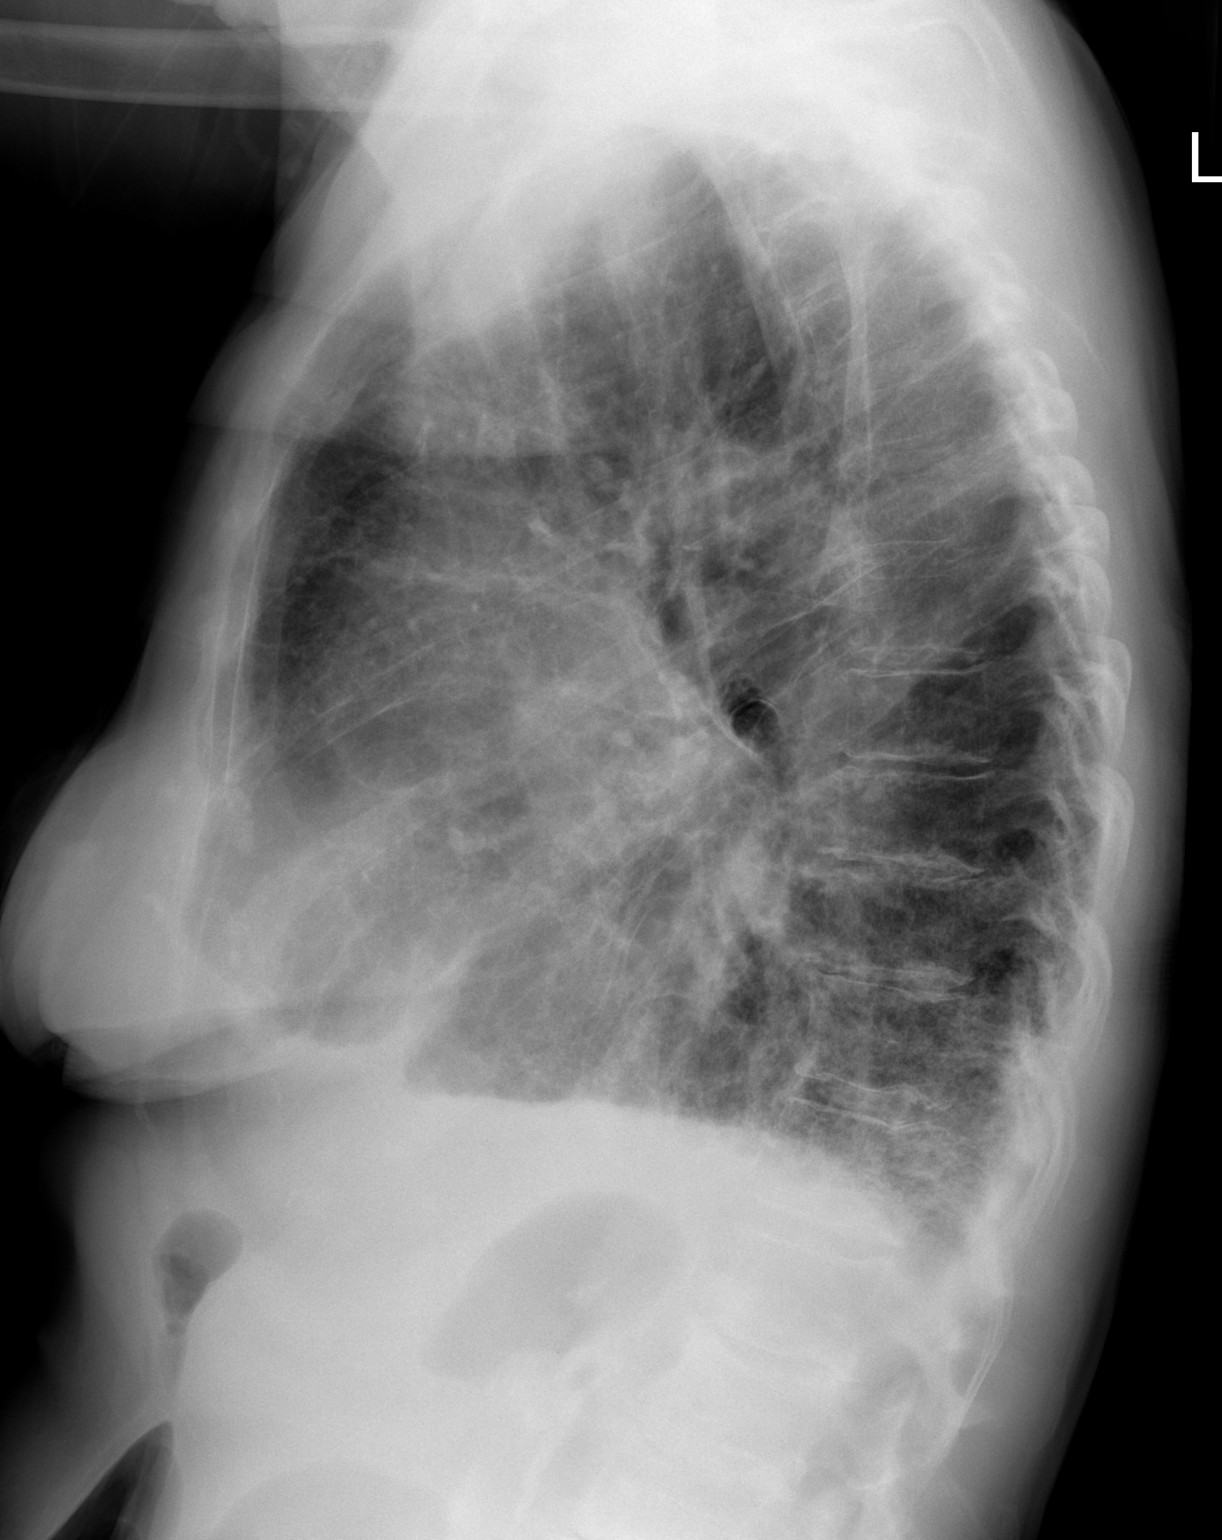

[2 of 2 positions shown; findings below may reference images not displayed]

FINDINGS: Bibasilar airspace opacities likely reflect pulmonary edema, though
pneumonia might have a similar appearance. Underlying vascular
congestion is noted. No definite pleural effusion or pneumothorax is
seen.

The heart is borderline normal in size. No acute osseous
abnormalities are identified. A metallic device is noted overlying
the upper abdomen. There is mild degenerative change at the upper
lumbar spine.
IMPRESSION: Bibasilar airspace opacities likely reflect pulmonary edema, though
pneumonia might have a similar appearance. Underlying vascular
congestion noted.

## 2016-01-07 ENCOUNTER — Ambulatory Visit: Payer: Self-pay | Admitting: Family

## 2017-02-03 IMAGING — DX DG CHEST 2V
2 series · 2 of 2 positions shown · non-contrast
Comparison: 12/27/2014

CLINICAL DATA: Amiodarone surveillance.

EXAM:
CHEST  2 VIEW

[chest pa]
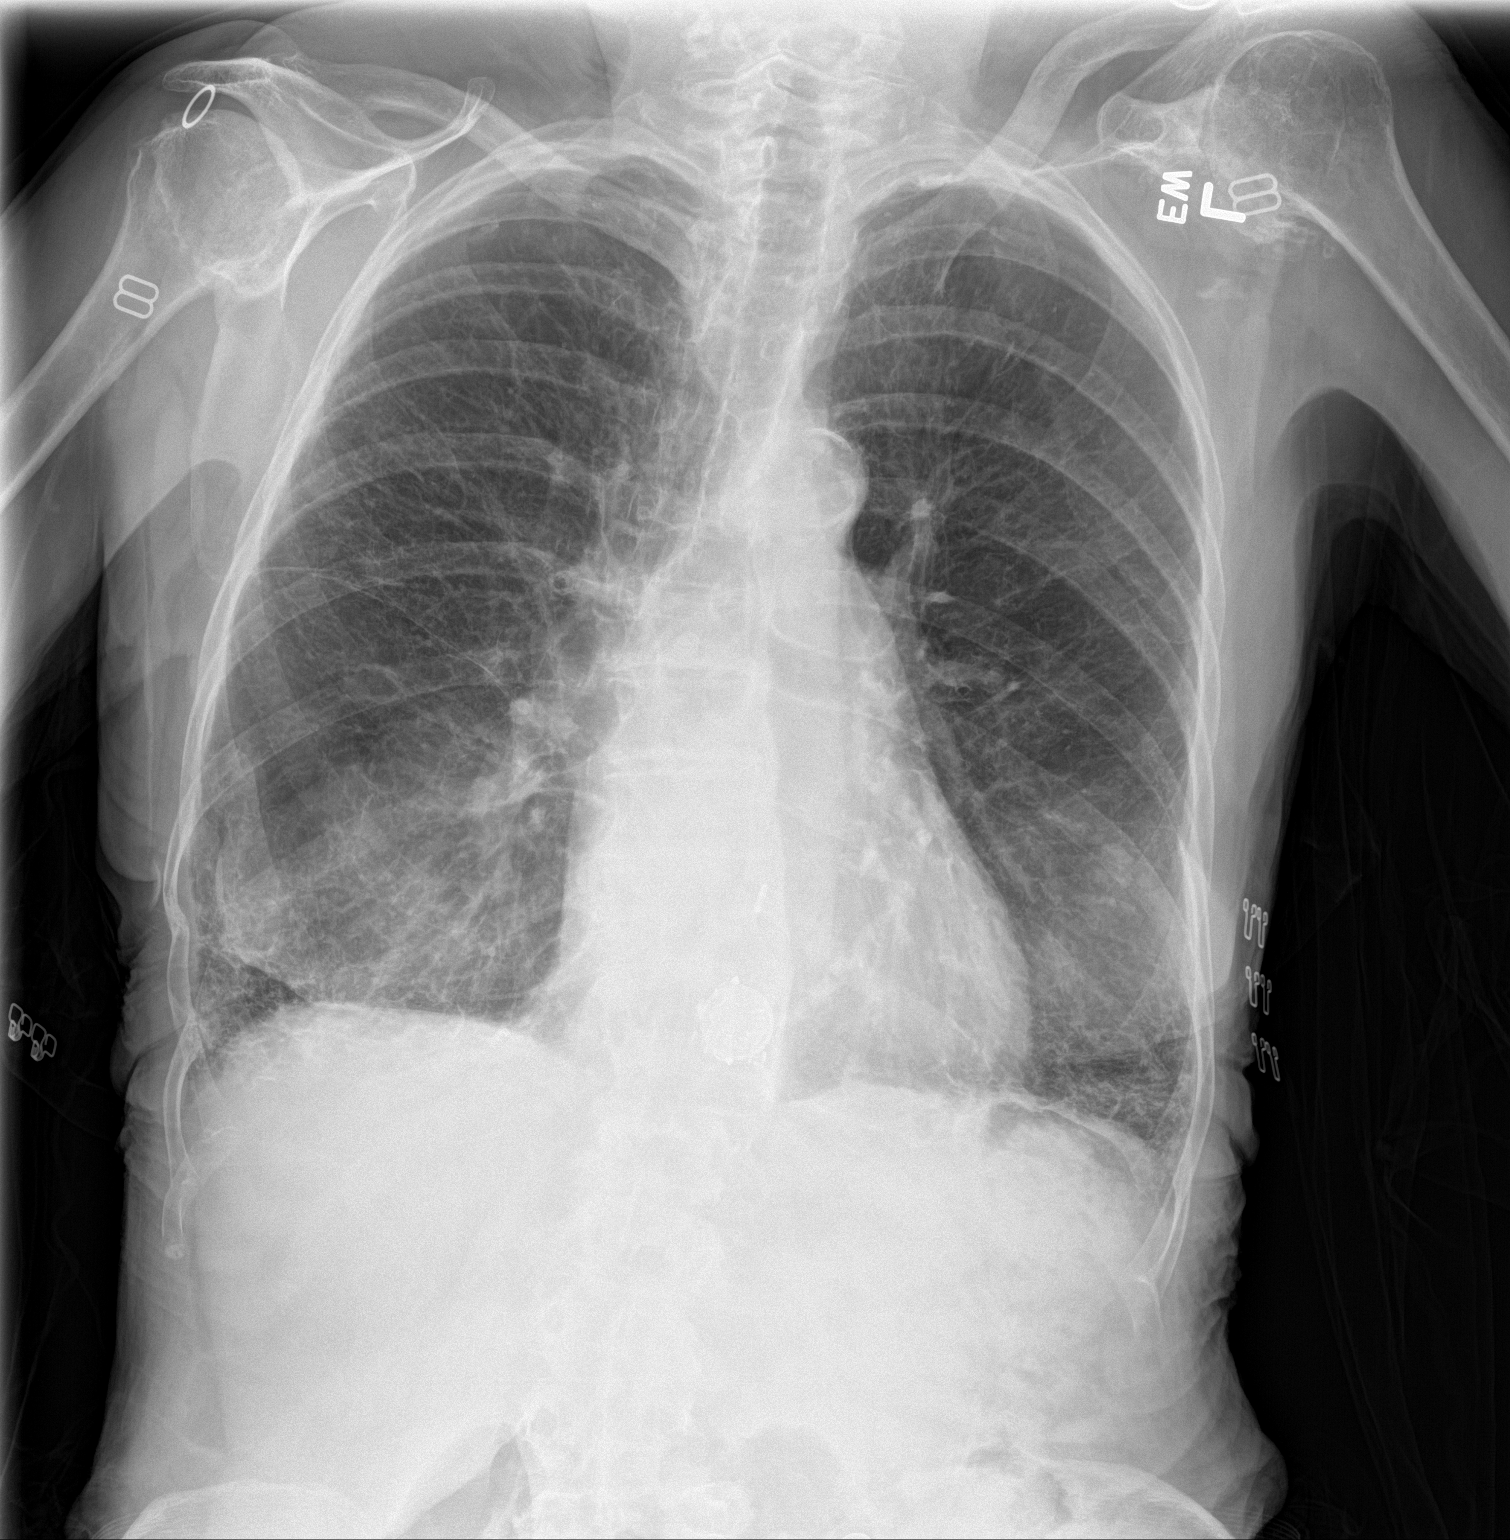

[chest lat]
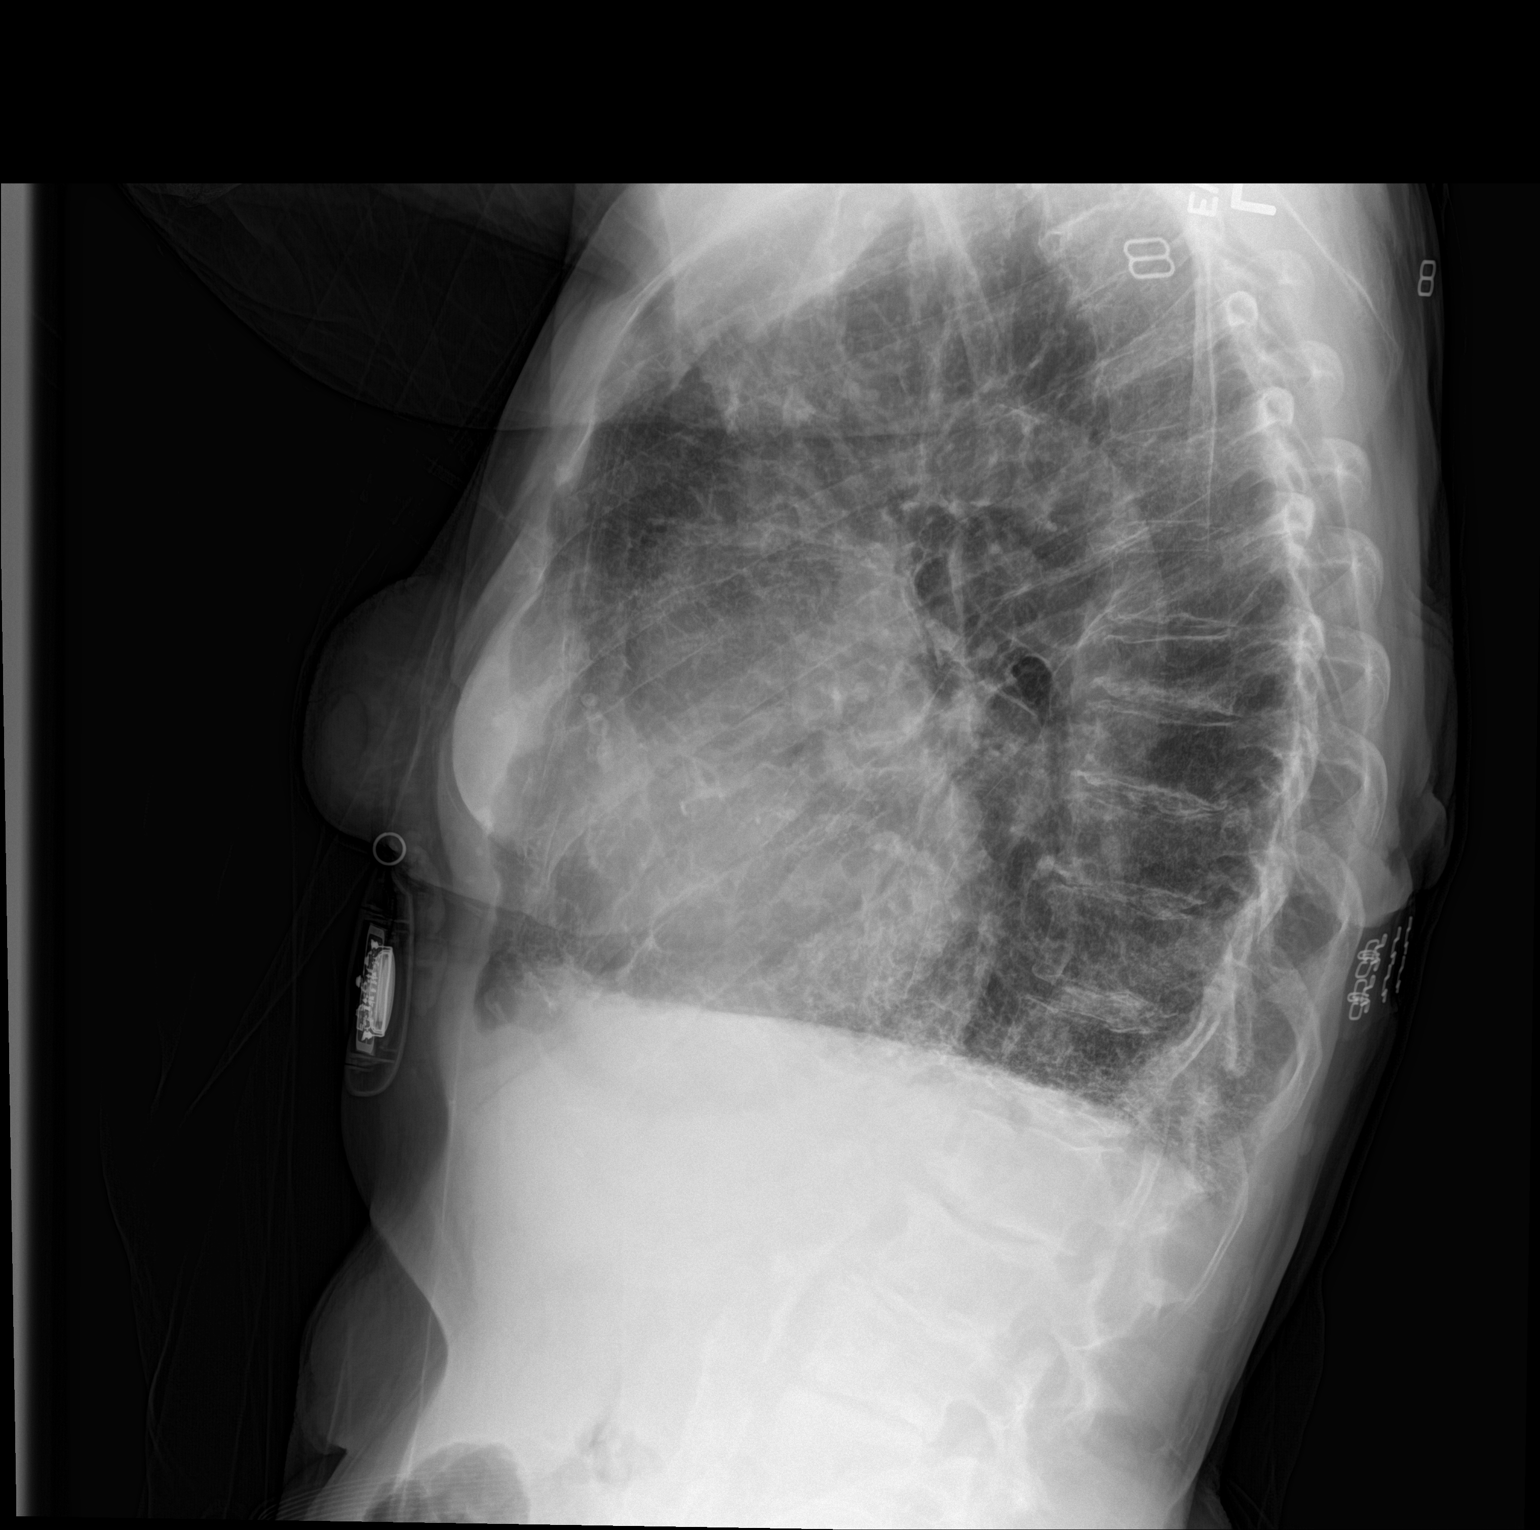

[2 of 2 positions shown; findings below may reference images not displayed]

FINDINGS: Chronic linear densities in the lung bases compatible with scarring/
fibrosis. Hyperinflation of the lungs compatible with COPD. No acute
airspace opacities or effusions. Heart is normal size.
IMPRESSION: Chronic bibasilar scarring/fibrosis.  COPD.  No acute findings.

## 2017-03-17 IMAGING — DX DG CHEST 1V PORT
1 series · 1 of 1 positions shown · non-contrast
Comparison: July 08, 2015

CLINICAL DATA: Respiratory distress

EXAM:
PORTABLE CHEST 1 VIEW

[chest ap]
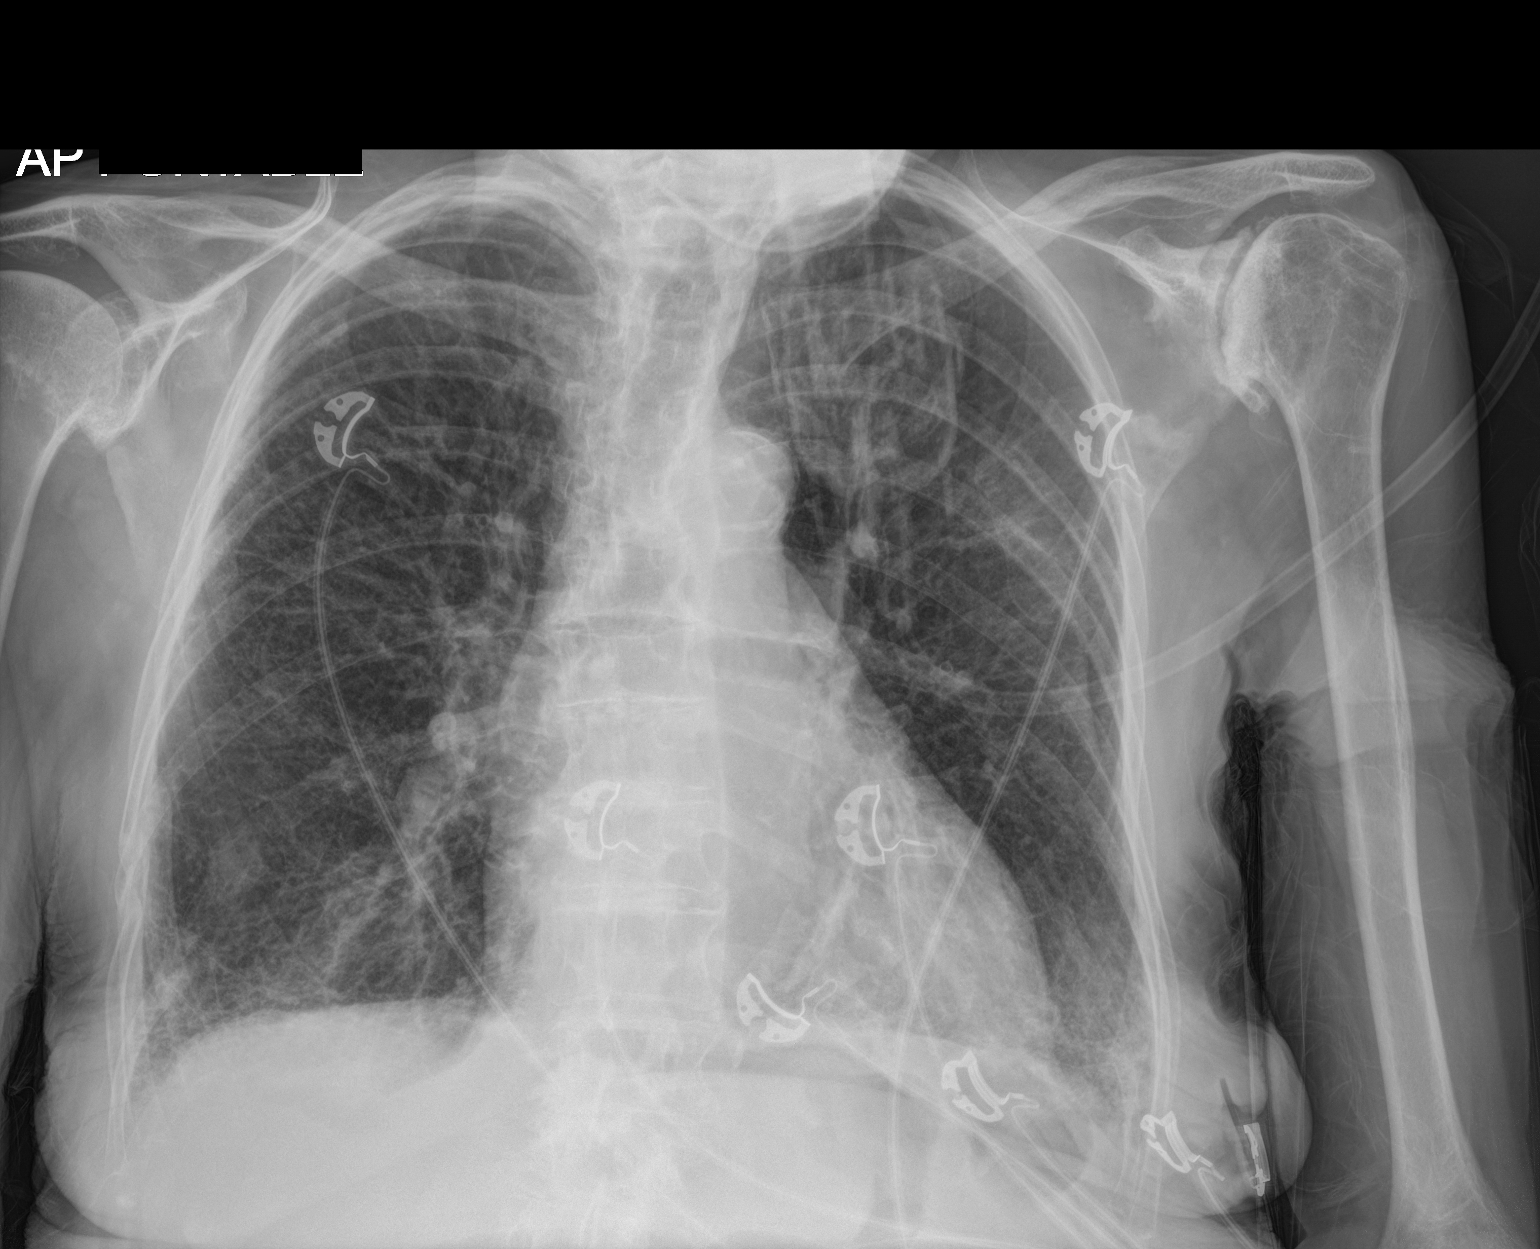

[1 of 1 positions shown; findings below may reference images not displayed]

FINDINGS: Lungs are somewhat hyperexpanded. There is persistent fibrotic
change throughout the lungs, most severely in the basilar regions
bilaterally. There is no frank edema or consolidation. Heart is
upper normal in size with pulmonary vascularity within normal
limits. There is atherosclerotic calcification in the aorta. No
adenopathy. There is extensive arthropathy in both shoulders, more
severe on the left than on the right.
IMPRESSION: Fibrotic change throughout the lungs bilaterally, most severe in the
bases bilaterally, a stable finding. No frank edema or
consolidation. Stable cardiac silhouette.
